# Patient Record
Sex: Female | Born: 1985
Health system: Southern US, Community
[De-identification: ages and names within clinical notes are randomized; demographics above are authoritative.]

## PROBLEM LIST (undated history)

## (undated) ENCOUNTER — Ambulatory Visit (HOSPITAL_COMMUNITY): Admission: EM | Discharge status: Absent without leave | Payer: BLUE CROSS/BLUE SHIELD

## (undated) DIAGNOSIS — T7421XA Adult sexual abuse, confirmed, initial encounter: Secondary | ICD-10-CM

## (undated) DIAGNOSIS — F32A Depression, unspecified: Secondary | ICD-10-CM

## (undated) DIAGNOSIS — F329 Major depressive disorder, single episode, unspecified: Secondary | ICD-10-CM

## (undated) DIAGNOSIS — K219 Gastro-esophageal reflux disease without esophagitis: Secondary | ICD-10-CM

## (undated) DIAGNOSIS — F4310 Post-traumatic stress disorder, unspecified: Secondary | ICD-10-CM

## (undated) DIAGNOSIS — F419 Anxiety disorder, unspecified: Secondary | ICD-10-CM

## (undated) DIAGNOSIS — Z227 Latent tuberculosis: Secondary | ICD-10-CM

## (undated) HISTORY — PX: NO PAST SURGERIES: SHX2092

## (undated) HISTORY — DX: Adult sexual abuse, confirmed, initial encounter: T74.21XA

## (undated) HISTORY — DX: Post-traumatic stress disorder, unspecified: F43.10

## (undated) HISTORY — DX: Latent tuberculosis: Z22.7

---

## 2015-05-28 ENCOUNTER — Ambulatory Visit
Admission: RE | Admit: 2015-05-28 | Discharge: 2015-05-28 | Disposition: A | Discharge status: Home / Self Care | Payer: No Typology Code available for payment source | Source: Ambulatory Visit | Attending: Infectious Disease | Admitting: Infectious Disease

## 2015-05-28 ENCOUNTER — Other Ambulatory Visit: Payer: Self-pay | Admitting: Infectious Disease

## 2015-05-28 DIAGNOSIS — R7611 Nonspecific reaction to tuberculin skin test without active tuberculosis: Secondary | ICD-10-CM

## 2015-06-18 ENCOUNTER — Emergency Department (HOSPITAL_COMMUNITY)
Admission: EM | Admit: 2015-06-18 | Discharge: 2015-06-19 | Disposition: A | Discharge status: Other Acute Inpatient Hospital | Payer: BLUE CROSS/BLUE SHIELD | Attending: Emergency Medicine | Admitting: Emergency Medicine

## 2015-06-18 ENCOUNTER — Encounter (HOSPITAL_COMMUNITY): Payer: Self-pay

## 2015-06-18 DIAGNOSIS — T4274XA Poisoning by unspecified antiepileptic and sedative-hypnotic drugs, undetermined, initial encounter: Secondary | ICD-10-CM | POA: Insufficient documentation

## 2015-06-18 DIAGNOSIS — Y998 Other external cause status: Secondary | ICD-10-CM | POA: Insufficient documentation

## 2015-06-18 DIAGNOSIS — T50904A Poisoning by unspecified drugs, medicaments and biological substances, undetermined, initial encounter: Secondary | ICD-10-CM

## 2015-06-18 DIAGNOSIS — Y9289 Other specified places as the place of occurrence of the external cause: Secondary | ICD-10-CM | POA: Diagnosis not present

## 2015-06-18 DIAGNOSIS — Y9389 Activity, other specified: Secondary | ICD-10-CM | POA: Diagnosis not present

## 2015-06-18 DIAGNOSIS — F329 Major depressive disorder, single episode, unspecified: Secondary | ICD-10-CM | POA: Insufficient documentation

## 2015-06-18 DIAGNOSIS — Z79899 Other long term (current) drug therapy: Secondary | ICD-10-CM | POA: Diagnosis not present

## 2015-06-18 HISTORY — DX: Major depressive disorder, single episode, unspecified: F32.9

## 2015-06-18 HISTORY — DX: Depression, unspecified: F32.A

## 2015-06-18 LAB — ACETAMINOPHEN LEVEL
Acetaminophen (Tylenol), Serum: <10 ug/mL — ABNORMAL LOW (ref 10–30)
Acetaminophen (Tylenol), Serum: <10 ug/mL — ABNORMAL LOW (ref 10–30)

## 2015-06-18 LAB — URINALYSIS, ROUTINE W REFLEX MICROSCOPIC
Bilirubin Urine: NEGATIVE
Glucose, UA: NEGATIVE mg/dL
HGB URINE DIPSTICK: NEGATIVE
Ketones, ur: NEGATIVE mg/dL
Nitrite: NEGATIVE
Protein, ur: NEGATIVE mg/dL
SPECIFIC GRAVITY, URINE: 1.004 — AB (ref 1.005–1.030)
UROBILINOGEN UA: 0.2 mg/dL (ref 0.0–1.0)
pH: 7 (ref 5.0–8.0)

## 2015-06-18 LAB — CBC WITH DIFFERENTIAL/PLATELET
BASOS ABS: 0 10*3/uL (ref 0.0–0.1)
Basophils Relative: 0 %
Eosinophils Absolute: 0.1 10*3/uL (ref 0.0–0.7)
Eosinophils Relative: 1 %
HEMATOCRIT: 40.6 % (ref 36.0–46.0)
HEMOGLOBIN: 13.6 g/dL (ref 12.0–15.0)
LYMPHS PCT: 44 %
Lymphs Abs: 2.4 10*3/uL (ref 0.7–4.0)
MCH: 26.5 pg (ref 26.0–34.0)
MCHC: 33.5 g/dL (ref 30.0–36.0)
MCV: 79.1 fL (ref 78.0–100.0)
Monocytes Absolute: 0.4 10*3/uL (ref 0.1–1.0)
Monocytes Relative: 7 %
NEUTROS ABS: 2.7 10*3/uL (ref 1.7–7.7)
Neutrophils Relative %: 48 %
Platelets: 212 10*3/uL (ref 150–400)
RBC: 5.13 MIL/uL — AB (ref 3.87–5.11)
RDW: 13 % (ref 11.5–15.5)
WBC: 5.5 10*3/uL (ref 4.0–10.5)

## 2015-06-18 LAB — SALICYLATE LEVEL: Salicylate Lvl: <4 mg/dL (ref 2.8–30.0)

## 2015-06-18 LAB — RAPID URINE DRUG SCREEN, HOSP PERFORMED
AMPHETAMINES: NOT DETECTED
BENZODIAZEPINES: NOT DETECTED
Barbiturates: NOT DETECTED
COCAINE: NOT DETECTED
OPIATES: NOT DETECTED
TETRAHYDROCANNABINOL: NOT DETECTED

## 2015-06-18 LAB — COMPREHENSIVE METABOLIC PANEL
ALT: 14 U/L (ref 14–54)
AST: 23 U/L (ref 15–41)
Albumin: 4.4 g/dL (ref 3.5–5.0)
Alkaline Phosphatase: 55 U/L (ref 38–126)
Anion gap: 9 (ref 5–15)
BILIRUBIN TOTAL: 0.8 mg/dL (ref 0.3–1.2)
BUN: 9 mg/dL (ref 6–20)
CHLORIDE: 107 mmol/L (ref 101–111)
CO2: 25 mmol/L (ref 22–32)
CREATININE: 0.94 mg/dL (ref 0.44–1.00)
Calcium: 9.4 mg/dL (ref 8.9–10.3)
GFR calc Af Amer: >60 mL/min (ref >60)
Glucose, Bld: 70 mg/dL (ref 65–99)
Potassium: 3.7 mmol/L (ref 3.5–5.1)
Sodium: 141 mmol/L (ref 135–145)
Total Protein: 8 g/dL (ref 6.5–8.1)

## 2015-06-18 LAB — URINE MICROSCOPIC-ADD ON

## 2015-06-18 LAB — TROPONIN I

## 2015-06-18 MED ORDER — NICOTINE 21 MG/24HR TD PT24
21.0000 mg | MEDICATED_PATCH | Freq: Every day | TRANSDERMAL | Status: DC
  Filled 2015-06-18: qty 1

## 2015-06-18 MED ORDER — ONDANSETRON HCL 4 MG/2ML IJ SOLN
4.0000 mg | Freq: Once | INTRAMUSCULAR | Status: AC
  Administered 2015-06-18: 4 mg via INTRAVENOUS
  Filled 2015-06-18: qty 2

## 2015-06-18 MED ORDER — SODIUM CHLORIDE 0.9 % IV BOLUS (SEPSIS)
1000.0000 mL | Freq: Once | INTRAVENOUS | Status: AC
  Administered 2015-06-18: 1000 mL via INTRAVENOUS

## 2015-06-18 MED ORDER — CHARCOAL ACTIVATED PO LIQD
50.0000 g | Freq: Once | ORAL | Status: AC
Start: 2015-06-18 — End: 2015-06-18
  Administered 2015-06-18: 50 g via ORAL
  Filled 2015-06-18: qty 240

## 2015-06-18 MED ORDER — LORAZEPAM 1 MG PO TABS: 1.0000 mg | ORAL_TABLET | Freq: Three times a day (TID) | ORAL | Status: DC | PRN

## 2015-06-18 MED ORDER — ONDANSETRON HCL 4 MG PO TABS: 4.0000 mg | ORAL_TABLET | Freq: Three times a day (TID) | ORAL | Status: DC | PRN

## 2015-06-18 NOTE — BH Assessment (Addendum)
Tele Assessment Note   Adriana Dawson is an 29 y.o. female.  -Clinician reviewed note by Dr. Rhunette Croft.  Patient took twenty OTC sleep aids in an effort to kill herself.  Patient had gotten into an argument with husband last night.  Today around 11am she took the OTC meds she had purchased at a [employer].  She was on the phone with husband while she took the sleeping pills.  Husband called 911 for patient.  Patient is visibly depressed, flat affect, listless.  She is also obviously drowsy from the sleeping aid she took to try to kill herself.  She said that the overdose was intentional.  Patient had argued with husband about her finding a job.  She cites job Architect and school as two stressors.  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  Paitent denies any HI or A/V hallucinations.  Pt has no previous inpatient or outpatient psychiatric care history.  Patient has been feeling suicidal for the last two months.  She had felt this way during her divorce a few years ago.  Has been with current husband for 3 years.  She admits to isolating herself in her home.  Mother is staying in the home also until November   -Patient care discussed with Donell Sievert, PA who recommended inpatient care.  Patient has been accepted to [facility] 400-2 to the services of Dr. Jama Flavors.  Clinician informed Dr. Elwin Mocha at [facility].  Pt to be transported when the adult unit calls TCU.  Axis I: Major Depression, single episode Axis II: Deferred Axis III:  Past Medical History  Diagnosis Date  . Depression    Axis IV: economic problems, occupational problems and other psychosocial or environmental problems Axis V: 31-40 impairment in reality testing  Past Medical History:  Past Medical History  Diagnosis Date  . Depression     History reviewed. No pertinent past surgical history.  Family History: No family history on file.  Social History:  reports that she has never smoked. She does not have any smokeless tobacco history on  file. Her alcohol and drug histories are not on file.  Additional Social History:  Alcohol / Drug Use Pain Medications: None Prescriptions: None Over the Counter: Biotin vitamins History of alcohol / drug use?: Yes Substance #1 Name of Substance 1: ETOH 1 - Age of First Use: 29 years of age 64 - Amount (size/oz): One glass or wine or beer 1 - Frequency: 1-2 times in a week 1 - Duration: on-going  1 - Last Use / Amount: 09/21  Drank wine when she took the sleeping pills today.  CIWA: CIWA-Ar BP: (!) 99/54 mmHg Pulse Rate: 79 COWS:    PATIENT STRENGTHS: (choose at least two) Average or above average intelligence Capable of independent living Communication skills General fund of knowledge Supportive family/friends  Allergies:  Allergies  Allergen Reactions  . Other Other (See Comments)    6 years ago patient was given a medication for the flu(possibly tamiflu) and it made her face fell funny.    Home Medications:  (Not in a hospital admission)  OB/GYN Status:  Patient's last menstrual period was 06/06/2015.  General Assessment Data Location of Assessment: [facility] TTS Assessment: In system Is this a Tele or Face-to-Face Assessment?: Face-to-Face Is this an Initial Assessment or a Re-assessment for this encounter?: Initial Assessment Marital status: Married Is patient pregnant?: No Pregnancy Status: No Living Arrangements: Spouse/significant other Can pt return to current living arrangement?: Yes Admission Status:  Voluntary Is patient capable of signing voluntary admission?: Yes Referral Source: Self/Family/Friend Insurance type: BC/BS     Crisis Care Plan Living Arrangements: Spouse/significant other Name of Psychiatrist: None Name of Therapist: None  Education Status Is patient currently in school?: Yes Current Grade: At [employer] working on associates Highest grade of school patient has completed: Currently working on an associates Name of school: Veterinary surgeon  person: patient  Risk to self with the past 6 months Suicidal Ideation: Yes-Currently Present Has patient been a risk to self within the past 6 months prior to admission? : No Suicidal Intent: Yes-Currently Present Has patient had any suicidal intent within the past 6 months prior to admission? : Yes Is patient at risk for suicide?: Yes Suicidal Plan?: Yes-Currently Present Has patient had any suicidal plan within the past 6 months prior to admission? : Yes Specify Current Suicidal Plan: Attempted overdose Access to Means: Yes Specify Access to Suicidal Means: OTC sedatives What has been your use of drugs/alcohol within the last 12 months?: ETOH use 1-2 times in a week Previous Attempts/Gestures: No How many times?: 0 Other Self Harm Risks: None Triggers for Past Attempts: None known Intentional Self Injurious Behavior: None Family Suicide History: No Recent stressful life event(s): Conflict (Comment), Financial Problems, Job Loss (Being in school.  Trying to get a job.) Persecutory voices/beliefs?: No Depression: Yes Depression Symptoms: Despondent, Insomnia, Guilt, Loss of interest in usual pleasures, Feeling worthless/self pity, Tearfulness Substance abuse history and/or treatment for substance abuse?: No Suicide prevention information given to non-admitted patients: Not applicable  Risk to Others within the past 6 months Homicidal Ideation: No Does patient have any lifetime risk of violence toward others beyond the six months prior to admission? : No Thoughts of Harm to Others: No Current Homicidal Intent: No Current Homicidal Plan: No Access to Homicidal Means: No Identified Victim: No one' History of harm to others?: No Assessment of Violence: None Noted Violent Behavior Description: None Does patient have access to weapons?: No Criminal Charges Pending?: No Does patient have a court date: No Is patient on probation?: No  Psychosis Hallucinations: None  noted Delusions: None noted  Mental Status Report Appearance/Hygiene: Unremarkable, In scrubs Eye Contact: Good Motor Activity: Freedom of movement, Unremarkable Speech: Logical/coherent, Soft Level of Consciousness: Drowsy Mood: Depressed, Despair, Helpless, Sad, Anxious Affect: Blunted, Depressed, Sad Anxiety Level: Moderate Thought Processes: Coherent, Relevant Judgement: Unimpaired Orientation: Person, Place, Situation Obsessive Compulsive Thoughts/Behaviors: Minimal  Cognitive Functioning Concentration: Normal Memory: Recent Intact, Remote Intact IQ: Average Insight: Good Impulse Control: Poor Appetite: Fair Weight Loss: 0 Weight Gain: 0 Sleep: Decreased Total Hours of Sleep: 6 Vegetative Symptoms: None  ADLScreening [facility] Assessment Services) Patient's cognitive ability adequate to safely complete daily activities?: Yes Patient able to express need for assistance with ADLs?: Yes Independently performs ADLs?: Yes (appropriate for developmental age)  Prior Inpatient Therapy Prior Inpatient Therapy: No Prior Therapy Dates: None Prior Therapy Facilty/Provider(s): None Reason for Treatment: None  Prior Outpatient Therapy Prior Outpatient Therapy: No Prior Therapy Dates: N/a Prior Therapy Facilty/Provider(s): N/A Reason for Treatment: N/A Does patient have an ACCT team?: No Does patient have Intensive In-House Services?  : No Does patient have [facility] services? : No Does patient have P4CC services?: No  ADL Screening (condition at time of admission) Patient's cognitive ability adequate to safely complete daily activities?: Yes Is the patient deaf or have difficulty hearing?: No Does the patient have difficulty seeing, even when wearing glasses/contacts?: No Does the patient have difficulty concentrating,  remembering, or making decisions?: No Patient able to express need for assistance with ADLs?: Yes Does the patient have difficulty dressing or bathing?:  No Independently performs ADLs?: Yes (appropriate for developmental age) Does the patient have difficulty walking or climbing stairs?: No Weakness of Legs: None Weakness of Arms/Hands: None       Abuse/Neglect Assessment (Assessment to be complete while patient is alone) Physical Abuse: Denies Verbal Abuse: Denies Sexual Abuse: Denies Exploitation of patient/patient's resources: Denies Self-Neglect: Denies     Merchant navy officer (For Healthcare) Does patient have an advance directive?: No Would patient like information on creating an advanced directive?: No - patient declined information    Additional Information 1:1 In Past 12 Months?: No CIRT Risk: No Elopement Risk: No Does patient have medical clearance?: Yes     Disposition:  Disposition Initial Assessment Completed for this Encounter: Yes Disposition of Patient: Inpatient treatment program, Referred to Type of inpatient treatment program: Adult Patient referred to:  (To be reviewed with PA.)  Beatriz Stallion Ray 06/18/2015 8:22 PM

## 2015-06-18 NOTE — ED Notes (Signed)
Bed: WA04 Expected date:  Expected time:  Means of arrival:  Comments: EMS-OD 

## 2015-06-18 NOTE — ED Notes (Signed)
MD at bedside. 

## 2015-06-18 NOTE — Progress Notes (Signed)
Patient listed as not having insurance or a pcp.  EDCM spoke to patient at bedside, sitter present.  Patient reports she has [employer].  EDCM confirmed patient has [employer].  EDCM provided patient with list of pcps who accept BCBS insurance within a 10 mile radius of patient's zip code.  Patient thankful for resources.  No further EDCM needs at this time.

## 2015-06-18 NOTE — ED Notes (Signed)
Family at bedside. 

## 2015-06-18 NOTE — ED Notes (Signed)
Report given to Whitney in [place].

## 2015-06-18 NOTE — ED Notes (Signed)
Pt transported by [facility] for attempted overdose.  Pt took approximately 20 Unisom sleeptabs (Doxylamine succinate ) at approx 1050 today.  Pt stated to husband today that she was going to jump off bridge.  Patient has hx of anxiety & depression and states she has been thinking of hurting herself x 2months.  Poison control contacted - see recommendations below.

## 2015-06-18 NOTE — ED Notes (Signed)
Pt husband Adriana Dawson is leaving and would like to leave phone number if needed to be contacted. [phone]

## 2015-06-18 NOTE — ED Notes (Addendum)
Jacki Cones, Research scientist (medical) from from poison control noted that patient was cleared from their protocol at 1814.   Pt remains asymptomatic, denies nausea. No vomiting noted.

## 2015-06-18 NOTE — ED Notes (Signed)
[facility] dispatch called for patient transport to behavior health.

## 2015-06-18 NOTE — ED Notes (Addendum)
TTS consultant at bedside, husband asked to leave.

## 2015-06-18 NOTE — ED Notes (Signed)
Pt coming in after an intentional Unisom OD.  Per Poison Control, Pt took Unisom  x 20 tablets around 1050.    Poison Control recommends: activated charcoal, if the Pt is alert and arrives prior to 1250 -EKG, cardiac monitoring -4hr Post Ingestion Acetamenophen level -Observe x 6hrs from ingestion -Supportive care: CNS depression, Tachycardia, HTN, Decreased bowel sounds, and Urinary Retention -Possible seizure activity: given Benzos or Phenobarb

## 2015-06-18 NOTE — ED Provider Notes (Signed)
CSN: [id number]     Arrival date & time 06/18/15  1215 History   First MD Initiated Contact with Patient 06/18/15 1250     Chief Complaint  Patient presents with  . Drug Overdose     (Consider location/radiation/quality/duration/timing/severity/associated sxs/prior Treatment) HPI Comments: Pt brought in via EMS with cc of OD. She reports being overwhelmed currently - she is new to the area, and is going to school, looking for jobs and is just stressed. Today, she had an argument with her husband and told him she was going to kill herself. She went to [facility], picked up OTC sleep meds and admits to taking 20 tabs prior to ER arrival. Pt took meds between 10:30-11:00.  She thinks she is depressed. She has never seen a psychiatrist. She also feels that she has not quite moved on from her divorce from 5 years ago. She had SI when she was getting divorced, but she has never made any attempts until now.   Patient is a 29 y.o. female presenting with Overdose. The history is provided by the patient.  Drug Overdose Pertinent negatives include no chest pain, no abdominal pain, no headaches and no shortness of breath.    Past Medical History  Diagnosis Date  . Depression    History reviewed. No pertinent past surgical history. No family history on file. Social History  Substance Use Topics  . Smoking status: Never Smoker   . Smokeless tobacco: None  . Alcohol Use: Yes     Comment: few drinks per week   OB History    No data available     Review of Systems  Constitutional: Positive for activity change.  Respiratory: Positive for chest tightness. Negative for shortness of breath.   Cardiovascular: Negative for chest pain.  Gastrointestinal: Negative for nausea, vomiting and abdominal pain.  Genitourinary: Negative for dysuria.  Musculoskeletal: Negative for neck pain.  Neurological: Negative for headaches.  Psychiatric/Behavioral: Positive for suicidal ideas, sleep disturbance and  self-injury.  All other systems reviewed and are negative.     Allergies  Other  Home Medications   Prior to Admission medications   Medication Sig Start Date End Date Taking? Authorizing Provider  BIOTIN 5000 PO Take 1 tablet by mouth daily.   Yes Historical Provider, MD   BP 102/68 mmHg  Pulse 62  Temp(Src) 98.2 F (36.8 C) (Oral)  Resp 16  SpO2 99%  LMP 06/06/2015 Physical Exam  Constitutional: She is oriented to person, place, and time. She appears well-developed and well-nourished.  HENT:  Head: Normocephalic and atraumatic.  Eyes: EOM are normal. Pupils are equal, round, and reactive to light.  Neck: Neck supple.  Cardiovascular: Normal rate, regular rhythm and normal heart sounds.   No murmur heard. Pulmonary/Chest: Effort normal. No respiratory distress.  Abdominal: Soft. She exhibits no distension. There is no tenderness. There is no rebound and no guarding.  Neurological: She is alert and oriented to person, place, and time.  Skin: Skin is warm and dry.  Nursing note and vitals reviewed.   ED Course  Procedures (including critical care time) Labs Review Labs Reviewed  CBC WITH DIFFERENTIAL/PLATELET - Abnormal; Notable for the following:    RBC 5.13 (*)    All other components within normal limits  ACETAMINOPHEN LEVEL - Abnormal; Notable for the following:    Acetaminophen (Tylenol), Serum <10 (*)    All other components within normal limits  URINALYSIS, ROUTINE W REFLEX MICROSCOPIC (NOT AT [facility] - Abnormal; Notable for  the following:    Specific Gravity, Urine 1.004 (*)    Leukocytes, UA SMALL (*)    All other components within normal limits  URINE MICROSCOPIC-ADD ON - Abnormal; Notable for the following:    Squamous Epithelial / LPF FEW (*)    All other components within normal limits  ACETAMINOPHEN LEVEL - Abnormal; Notable for the following:    Acetaminophen (Tylenol), Serum <10 (*)    All other components within normal limits  COMPREHENSIVE  METABOLIC PANEL  TROPONIN I  SALICYLATE LEVEL  URINE RAPID DRUG SCREEN, HOSP PERFORMED  POC URINE PREG, ED    Imaging Review No results found. I have personally reviewed and evaluated these images and lab results as part of my medical decision-making.   EKG Interpretation   Date/Time:  Wednesday June 18 2015 12:36:43 EDT Ventricular Rate:  74 PR Interval:  161 QRS Duration: 83 QT Interval:  402 QTC Calculation: 446 R Axis:   82 Text Interpretation:  Sinus rhythm Baseline wander in lead(s) V2 ED  PHYSICIAN INTERPRETATION AVAILABLE IN [facility] Confirmed by TEST,  Record ([id number]) on 06/19/2015 7:31:41 AM      MDM   Final diagnoses:  Overdose, undetermined intent, initial encounter    Pt overdosed on sleep meds. She was given activated charcoal as per recommendation from the poison control. She was monitored closely for 4+ hours and then medically cleared for psych evaluation.  Pt stayed hemodynamically stable.  CRITICAL CARE Performed by: Derwood Kaplan   Total critical care time: 38 min  Critical care time was exclusive of separately billable procedures and treating other patients.  Critical care was necessary to treat or prevent imminent or life-threatening deterioration.  Critical care was time spent personally by me on the following activities: development of treatment plan with patient and/or surrogate as well as nursing, discussions with consultants, evaluation of patient's response to treatment, examination of patient, obtaining history from patient or surrogate, ordering and performing treatments and interventions, ordering and review of laboratory studies, ordering and review of radiographic studies, pulse oximetry and re-evaluation of patient's condition.     Derwood Kaplan, MD 06/20/15 1941

## 2015-06-18 NOTE — ED Notes (Signed)
Patient is resting comfortably. Sitter at bedside. Pt remains in no distress.

## 2015-06-19 ENCOUNTER — Inpatient Hospital Stay (HOSPITAL_COMMUNITY)
Admission: AD | Admit: 2015-06-19 | Discharge: 2015-06-21 | DRG: 885 | Disposition: A | Discharge status: Home / Self Care | Payer: BLUE CROSS/BLUE SHIELD | Source: Intra-hospital | Attending: Psychiatry | Admitting: Psychiatry

## 2015-06-19 ENCOUNTER — Encounter (HOSPITAL_COMMUNITY): Payer: Self-pay

## 2015-06-19 DIAGNOSIS — F322 Major depressive disorder, single episode, severe without psychotic features: Secondary | ICD-10-CM

## 2015-06-19 DIAGNOSIS — T450X2A Poisoning by antiallergic and antiemetic drugs, intentional self-harm, initial encounter: Secondary | ICD-10-CM | POA: Diagnosis not present

## 2015-06-19 DIAGNOSIS — R45851 Suicidal ideations: Secondary | ICD-10-CM | POA: Diagnosis present

## 2015-06-19 HISTORY — DX: Major depressive disorder, single episode, severe without psychotic features: F32.2

## 2015-06-19 LAB — POC URINE PREG, ED: Preg Test, Ur: NEGATIVE

## 2015-06-19 MED ORDER — FLUOXETINE HCL 20 MG PO CAPS
20.0000 mg | ORAL_CAPSULE | Freq: Every day | ORAL | Status: DC
  Administered 2015-06-19 – 2015-06-21 (×3): 20 mg via ORAL
  Filled 2015-06-19 (×6): qty 1

## 2015-06-19 MED ORDER — HYDROXYZINE HCL 25 MG PO TABS: 25.0000 mg | ORAL_TABLET | Freq: Four times a day (QID) | ORAL | Status: DC | PRN

## 2015-06-19 MED ORDER — ALUM & MAG HYDROXIDE-SIMETH 200-200-20 MG/5ML PO SUSP: 30.0000 mL | ORAL | Status: DC | PRN

## 2015-06-19 MED ORDER — TRAZODONE HCL 50 MG PO TABS
50.0000 mg | ORAL_TABLET | Freq: Every evening | ORAL | Status: DC | PRN
  Filled 2015-06-19 (×8): qty 1

## 2015-06-19 MED ORDER — MAGNESIUM HYDROXIDE 400 MG/5ML PO SUSP: 30.0000 mL | Freq: Every day | ORAL | Status: DC | PRN

## 2015-06-19 MED ORDER — ACETAMINOPHEN 325 MG PO TABS: 650.0000 mg | ORAL_TABLET | Freq: Four times a day (QID) | ORAL | Status: DC | PRN

## 2015-06-19 NOTE — BHH Suicide Risk Assessment (Signed)
[facility] Admission Suicide Risk Assessment   Nursing information obtained from:   patient and chart  Demographic factors:   married, enrolled in Nursing School  Current Mental Status:   see below  Loss Factors:   marital stressors , stress with parents at home and nursing school, financial constraints  Historical Factors:   no prior psychiatric history Risk Reduction Factors:   resilience, social support  Total Time spent with patient: 45 minutes Principal Problem:  MDD, single episode  Diagnosis:   Patient Active Problem List   Diagnosis Date Noted  . MDD (major depressive disorder), single episode, severe , no psychosis [F32.2] 06/19/2015     Continued Clinical Symptoms:  Alcohol Use Disorder Identification Test Final Score (AUDIT): 3 The "Alcohol Use Disorders Identification Test", Guidelines for Use in Primary Care, Second Edition.  World Science writer [facility]). Score between 0-7:  no or low risk or alcohol related problems. Score between 8-15:  moderate risk of alcohol related problems. Score between 16-19:  high risk of alcohol related problems. Score 20 or above:  warrants further diagnostic evaluation for alcohol dependence and treatment.   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 rate 16, height  (1.6 m), weight 131 lb (59.421 kg), last menstrual period 06/06/2015.Body mass index is 23.21 kg/(m^2).   See Admit Note MSE                                                        COGNITIVE FEATURES THAT CONTRIBUTE TO RISK:  Closed-mindedness and Loss of executive function    SUICIDE RISK:   Moderate:  Frequent suicidal ideation with limited intensity, and duration, some  specificity in terms of plans, no associated intent, good self-control, limited dysphoria/symptomatology, some risk factors present, and identifiable protective factors, including available and accessible social support.  PLAN OF CARE: Patient will be admitted to inpatient psychiatric unit for stabilization and safety. Will provide and encourage milieu participation. Provide medication management and maked adjustments as needed.  Will follow daily.    Medical Decision Making:  Review of Psycho-Social Stressors (1), Review or order clinical lab tests (1), Established Problem, Worsening (2) and Review of New Medication or Change in Dosage (2)  I certify that inpatient services furnished can reasonably be expected to improve the patient's condition.   Adriana Dawson 06/19/2015, 3:01 PM

## 2015-06-19 NOTE — Progress Notes (Signed)
Adult Psychoeducational Group Note  Date:  06/19/2015 Time:  0900  Group Topic/Focus:  Orientation:   The focus of this group is to educate the patient on the purpose and policies of crisis stabilization and provide a format to answer questions about their admission.  The group details unit policies and expectations of patients while admitted.  Participation Level:  Active  Participation Quality:  Appropriate  Affect:  Appropriate  Cognitive:  Appropriate  Insight: Appropriate  Engagement in Group:  Engaged  Modes of Intervention:  Orientation  Additional Comments:  Enjoys zip lining!   Kortnie Stovall L 06/19/2015, 11:15 AM

## 2015-06-19 NOTE — Progress Notes (Signed)
D: Pt presents with flat affect and depressed mood. Pt rates depression 9/10. Pt denies suicidal thoughts. Pt reported that she have had periods were she is happy and then sad or upset. Pt reported that her sadness is not triggered by any stressors. Pt reported that she's been having this problems for about four years. A: Medications reviewed. Verbal support given. Pt encouraged to attend groups. 15 minute checks performed for safety. R: Pt receptive to tx.

## 2015-06-19 NOTE — Tx Team (Signed)
Interdisciplinary Treatment Plan Update (Adult) Date: 06/19/2015   Date: 06/19/2015 1:34 PM  Progress in Treatment:  Attending groups: Yes  Participating in groups: Yes  Taking medication as prescribed: Yes  Tolerating medication: Yes  Family/Significant othe contact made: No, CSW attempting to make contact with husband Patient understands diagnosis: Yes AEB seeking help with depression and anxiety Discussing patient identified problems/goals with staff: Yes  Medical problems stabilized or resolved: Yes  Denies suicidal/homicidal ideation: Yes Patient has not harmed self or Others: Yes   New problem(s) identified: None identified at this time.   Discharge Plan or Barriers: Pt will return home and follow-up with outpatient resources.  Additional comments: n/a   Reason for Continuation of Hospitalization:  Anxiety Depression Medication stabilization Suicidal ideation  Estimated length of stay: 3-5 days  Review of initial/current patient goals per problem list:   1.  Goal(s): Patient will participate in aftercare plan  Met:  Yes  Target date: 3-5 days from date of admission   As evidenced by: Patient will participate within aftercare plan AEB aftercare provider and housing plan at discharge being identified.   06/19/15: Pt will return home and follow-up with outpatient resources  2.  Goal (s): Patient will exhibit decreased depressive symptoms and suicidal ideations.  Met:  No  Target date: 3-5 days from date of admission   As evidenced by: Patient will utilize self rating of depression at 3 or below and demonstrate decreased signs of depression or be deemed stable for discharge by MD. 06/19/15: Pt was admitted with symptoms of depression, rating 10/10. Pt continues to present with flat affect and depressive symptoms.  Pt will demonstrate decreased symptoms of depression and rate depression at 3/10 or lower prior to discharge.  3.  Goal(s): Patient will demonstrate  decreased signs and symptoms of anxiety.  Met:  No  Target date: 3-5 days from date of admission   As evidenced by: Patient will utilize self rating of anxiety at 3 or below and demonstrated decreased signs of anxiety, or be deemed stable for discharge by MD 06/19/15: Pt was admitted with increased levels of anxiety and is currently rating those symptoms highly. Pt will demonstrated decreased symptoms of anxiety and rate it at 3/10 prior to d/c.  Attendees:  Patient:    Family:    Physician: Dr. Parke Poisson, MD  06/19/2015 1:34 PM  Nursing: Lars Pinks, RN Case manager  06/19/2015 1:34 PM  Clinical Social Worker Norman Clay, MSW 06/19/2015 1:34 PM  Other: Lucinda Dell, Beverly Sessions Liasion 06/19/2015 1:34 PM  Clinical: Darrol Angel, RN 06/19/2015 1:34 PM  Other: , RN Charge Nurse 06/19/2015 1:34 PM  Other:     Peri Maris, [place] MSW
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
## 2015-06-19 NOTE — Progress Notes (Signed)
D: Patient in the hallway today.  Patient states she had a good day.  Patient states she had a good day.  Patient states she has been talking to her peers and that has made her have a good day.  Patient states her husband visited today and she states they were able to talk.  Patient signed a 72 hour request for discharge tonight. Patient denies SI/HI and denies AVH.   A: Staff to monitor Q 15 mins for safety.  Encouragement and support offered.  No scheduled medications administered per orders.   R: Patient remains safe on the unit.  Patient attended group tonight.  Patient visible on the unit tonight.  Patient taking administered medications.

## 2015-06-19 NOTE — Tx Team (Signed)
Initial Interdisciplinary Treatment Plan   PATIENT STRESSORS: Educational concerns Financial difficulties Marital or family conflict Occupational concerns   PATIENT STRENGTHS: Average or above average intelligence Communication skills General fund of knowledge Motivation for treatment/growth Supportive family/friends   PROBLEM LIST: Problem List/Patient Goals Date to be addressed Date deferred Reason deferred Estimated date of resolution  Risk for suicide 06/19/15     Family issues 06/19/15     "can't think of anything right now" 06/19/15     Anxiety 06/19/15                                    DISCHARGE CRITERIA:  Ability to meet basic life and health needs Improved stabilization in mood, thinking, and/or behavior Verbal commitment to aftercare and medication compliance  PRELIMINARY DISCHARGE PLAN: Attend aftercare/continuing care group Outpatient therapy Return to previous living arrangement  PATIENT/FAMIILY INVOLVEMENT: This treatment plan has been presented to and reviewed with the patient, Nyilah Kight.  The patient and family have been given the opportunity to ask questions and make suggestions.  Jacques Navy A 06/19/2015, 2:27 AM

## 2015-06-19 NOTE — [facility] Group Notes (Signed)
[facility] Group Therapy 06/19/2015 1:15pm  Type of Therapy: [facility] Presentation  Participation Level: Active  Participation Quality: Attentive  Affect: Appropriate  Cognitive: Oriented  Insight: Developing/Improving  Engagement in Therapy: Engaged  Modes of Intervention: Discussion, Education and Socialization  Summary of Progress/Problems: Mental [facility] Association (MHA) Speaker came to talk about his personal journey with substance abuse and addiction. The pt processed ways by which to relate to the speaker. MHA speaker provided handouts and educational information pertaining to groups and services offered by the [facility] Pt was engaged in speaker's presentation and was receptive to resources provided.    Chad Cordial, LCSWA 06/19/2015 1:39 PM

## 2015-06-19 NOTE — Progress Notes (Signed)
Patient ID: Adriana Dawson, female   DOB: 1986/07/07, 29 y.o.   MRN: [id number]  Admission Note:  29 yr female who presents VC in no acute distress for the treatment of SI,  Depression and anxiety. Pt appears flat and depressed. Pt was calm and cooperative with admission process. Pt presents with passive SI and contracts for safety upon admission. Pt denies AVH . Pt OD'd on 20 unisom pills due to stress and feeling overwhelmed and not knowing how to cope. 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 Pt has no Psych hx, this is first experience.     A:Skin was assessed and found to be clear of any abnormal marks . PT searched and no contraband found, POC and unit policies explained and understanding verbalized. Consents obtained. Food and fluids offered, and fluids accepted.  R: Pt had no additional questions or concerns.

## 2015-06-19 NOTE — H&P (Signed)
Psychiatric Admission Assessment Adult  Patient Identification: Adriana Dawson MRN:  [id number] Date of Evaluation:  06/19/2015 Chief Complaint:   " I have been feeling overwhelmed " Principal Diagnosis:  Suicide attempt by overdose   Diagnosis:   Patient Active Problem List   Diagnosis Date Noted  . MDD (major depressive disorder), single episode, severe , no psychosis [F32.2] 06/19/2015   History of Present Illness: 29 year old female, reports she was feeling verwhelmed currently - she is new to the area (moved from [place]), and is going to school(nursing), looking for jobs and is just " very  Stressed" . She also reports having  arguments with her husband about their financial situation. During argument she told him she was going to kill herself. Impulsively she went to a drugstore , picked up OTC sleep meds and admits to taking 20 tabs prior to ER arrival.  She states that after taking them she felt very drowsy and told husband who brought her to hospital. These events occurred yesterday.  She reports depression and neuro vegetative symptoms as below. . She has never seen a psychiatrist. She also feels that she has not quite moved on from her divorce from 5 years ago, and states she feels part of the divorce was her fault. She is sadden that he was able to move on so easily and get married but she has not been able to(was deeply attracted to him).  She had SI when she was getting divorced, but she has never made any attempts until now.   Elements:   Gradually Worsening depression, currently severe, in the context of severe psycho-social  stressors, which have been increasing- status post suicide attempt .  Associated Signs/Symptoms: Depression Symptoms:  depressed mood, fatigue, feelings of worthlessness/guilt, difficulty concentrating, hopelessness, suicidal attempt, (Hypo) Manic Symptoms:  None Anxiety Symptoms:  States she feels she worries excessively  Psychotic Symptoms:  None PTSD  Symptoms: Does not endorse  Total Time spent with patient: 45 minutes    Past Psychiatric History-  No prior psychiatric admissions, no prior suicide attempts, no history of cutting, no history of psychosis, denies any history of mania . Has not been on psychiatric medications in the past .  Past Medical History: denies any medical issues, NKDA, does not smoke .  Past Medical History  Diagnosis Date  . Depression    History reviewed. No pertinent past surgical history. Family History:  Parents alive,  Currently living with patient, Father alcoholic , has one brother . Denies mood disorder, brother has history of substance dependence  Social History:  Married x 2 , divorced , Nursing Student, no children, parents currently living with her , no legal issues  History  Alcohol Use  . Yes    Comment: few drinks per week     History  Drug Use Not on file    Social History   Social History  . Marital Status: Married    Spouse Name: N/A  . Number of Children: N/A  . Years of Education: N/A   Social History Main Topics  . Smoking status: Never Smoker   . Smokeless tobacco: None  . Alcohol Use: Yes     Comment: few drinks per week  . Drug Use: None  . Sexual Activity: Yes    Birth Control/ Protection: Condom   Other Topics Concern  . None   Social History Narrative   Additional Social History:    Musculoskeletal: Strength & Muscle Tone: within normal limits Gait &  Station: normal Patient leans: N/A  Psychiatric Specialty Exam: Physical Exam  Constitutional: She is oriented to person, place, and time. She appears well-developed.  HENT:  Head: Normocephalic.  Eyes: Pupils are equal, round, and reactive to light.  Neck: Normal range of motion.  Cardiovascular: Normal rate.   Respiratory: Effort normal.  GI: Soft.  Musculoskeletal: Normal range of motion.  Neurological: She is alert and oriented to person, place, and time.  Skin: Skin is warm and dry.  Psychiatric:  Her behavior is normal. Judgment and thought content normal.  Depressed mood and worthless    Review of Systems  Constitutional: Negative.   HENT: Negative.  Negative for hearing loss.   Eyes: Negative.   Respiratory: Negative.   Cardiovascular: Negative.   Gastrointestinal: Negative.   Genitourinary: Negative.   Musculoskeletal: Negative.   Skin: Negative.   Neurological: Negative for seizures and headaches.  Endo/Heme/Allergies: Negative.   Psychiatric/Behavioral: Positive for depression and suicidal ideas.  All other systems reviewed and are negative.   Blood pressure 106/77, pulse 75, temperature 98 F (36.7 C), temperature source Oral, resp. rate 16, height 5' 3"  (1.6 m), weight 131 lb (59.421 kg), last menstrual period 06/06/2015.Body mass index is 23.21 kg/(m^2).  General Appearance: Fairly Groomed  Engineer, water::  Good  Speech:  Clear and Coherent  Volume:  Normal  Mood:  Depressed  Affect:  Constricted and Depressed  Thought Process:  Linear  Orientation:  Full (Time, Place, and Person)  Thought Content:   No hallucinations, no delusions   Suicidal Thoughts:  No denies any current suicidal ideations or self injurious ideations , states she regrets having attempted suicide and that she realizes she has a lot to live for.   Homicidal Thoughts:  No  Memory:  Immediate;   Good Recent;   Good Remote;   Good  Judgement:   Fair   Insight:  Fair  Psychomotor Activity:  Normal  Concentration:  Good  Recall:  Good  Fund of Knowledge:Good  Language: Good  Akathisia:  No  Handed:  Right  AIMS (if indicated):     Assets:  Desire for Improvement Physical Health Talents/Skills Vocational/Educational  ADL's:  Intact  Cognition: WNL  Sleep:  4-5 hours   Risk to Self: Is patient at risk for suicide?: Yes What has been your use of drugs/alcohol within the last 12 months?: ETOH use 1-2 times a week Risk to Others:   Prior Inpatient Therapy:   Prior Outpatient Therapy:     Alcohol Screening: 1. How often do you have a drink containing alcohol?: 2 to 3 times a week 2. How many drinks containing alcohol do you have on a typical day when you are drinking?: 1 or 2 3. How often do you have six or more drinks on one occasion?: Never Preliminary Score: 0 4. How often during the last year have you found that you were not able to stop drinking once you had started?: Never 5. How often during the last year have you failed to do what was normally expected from you becasue of drinking?: Never 6. How often during the last year have you needed a first drink in the morning to get yourself going after a heavy drinking session?: Never 7. How often during the last year have you had a feeling of guilt of remorse after drinking?: Never 8. How often during the last year have you been unable to remember what happened the night before because you had been drinking?: Never 9.  Have you or someone else been injured as a result of your drinking?: No 10. Has a relative or friend or a doctor or another health worker been concerned about your drinking or suggested you cut down?: No Alcohol Use Disorder Identification Test Final Score (AUDIT): 3 Brief Intervention: AUDIT score less than 7 or less-screening does not suggest unhealthy drinking-brief intervention not indicated  Allergies:   Allergies  Allergen Reactions  . Other Other (See Comments)    6 years ago patient was given a medication for the flu(possibly tamiflu) and it made her face fell funny.   Lab Results:  Results for orders placed or performed during the hospital encounter of 06/18/15 (from the past 48 hour(s))  CBC with Differential/Platelet     Status: Abnormal   Collection Time: 06/18/15  1:18 PM  Result Value Ref Range   WBC 5.5 4.0 - 10.5 K/uL   RBC 5.13 (H) 3.87 - 5.11 MIL/uL   Hemoglobin 13.6 12.0 - 15.0 g/dL   HCT 40.6 36.0 - 46.0 %   MCV 79.1 78.0 - 100.0 fL   MCH 26.5 26.0 - 34.0 pg   MCHC 33.5 30.0 - 36.0  g/dL   RDW 13.0 11.5 - 15.5 %   Platelets 212 150 - 400 K/uL   Neutrophils Relative % 48 %   Neutro Abs 2.7 1.7 - 7.7 K/uL   Lymphocytes Relative 44 %   Lymphs Abs 2.4 0.7 - 4.0 K/uL   Monocytes Relative 7 %   Monocytes Absolute 0.4 0.1 - 1.0 K/uL   Eosinophils Relative 1 %   Eosinophils Absolute 0.1 0.0 - 0.7 K/uL   Basophils Relative 0 %   Basophils Absolute 0.0 0.0 - 0.1 K/uL  Comprehensive metabolic panel     Status: None   Collection Time: 06/18/15  1:18 PM  Result Value Ref Range   Sodium 141 135 - 145 mmol/L   Potassium 3.7 3.5 - 5.1 mmol/L   Chloride 107 101 - 111 mmol/L   CO2 25 22 - 32 mmol/L   Glucose, Bld 70 65 - 99 mg/dL   BUN 9 6 - 20 mg/dL   Creatinine, Ser 0.94 0.44 - 1.00 mg/dL   Calcium 9.4 8.9 - 10.3 mg/dL   Total Protein 8.0 6.5 - 8.1 g/dL   Albumin 4.4 3.5 - 5.0 g/dL   AST 23 15 - 41 U/L   ALT 14 14 - 54 U/L   Alkaline Phosphatase 55 38 - 126 U/L   Total Bilirubin 0.8 0.3 - 1.2 mg/dL   GFR calc non Af Amer >60 >60 mL/min   GFR calc Af Amer >60 >60 mL/min    Comment: (NOTE) The eGFR has been calculated using the CKD EPI equation. This calculation has not been validated in all clinical situations. eGFR's persistently <60 mL/min signify possible Chronic Kidney Disease.    Anion gap 9 5 - 15  Troponin I     Status: None   Collection Time: 06/18/15  1:18 PM  Result Value Ref Range   Troponin I <0.03 <0.031 ng/mL    Comment:        NO INDICATION OF MYOCARDIAL INJURY.   Salicylate level     Status: None   Collection Time: 06/18/15  1:18 PM  Result Value Ref Range   Salicylate Lvl <1.7 2.8 - 30.0 mg/dL  Acetaminophen level     Status: Abnormal   Collection Time: 06/18/15  1:18 PM  Result Value Ref Range   Acetaminophen (Tylenol),  Serum <10 (L) 10 - 30 ug/mL    Comment:        THERAPEUTIC CONCENTRATIONS VARY SIGNIFICANTLY. A RANGE OF 10-30 ug/mL MAY BE AN EFFECTIVE CONCENTRATION FOR MANY PATIENTS. HOWEVER, SOME ARE BEST TREATED AT  CONCENTRATIONS OUTSIDE THIS RANGE. ACETAMINOPHEN CONCENTRATIONS >150 ug/mL AT 4 HOURS AFTER INGESTION AND >50 ug/mL AT 12 HOURS AFTER INGESTION ARE OFTEN ASSOCIATED WITH TOXIC REACTIONS.   Urinalysis, Routine w reflex microscopic (not at [facility])     Status: Abnormal   Collection Time: 06/18/15  1:18 PM  Result Value Ref Range   Color, Urine YELLOW YELLOW   APPearance CLEAR CLEAR   Specific Gravity, Urine 1.004 (L) 1.005 - 1.030   pH 7.0 5.0 - 8.0   Glucose, UA NEGATIVE NEGATIVE mg/dL   Hgb urine dipstick NEGATIVE NEGATIVE   Bilirubin Urine NEGATIVE NEGATIVE   Ketones, ur NEGATIVE NEGATIVE mg/dL   Protein, ur NEGATIVE NEGATIVE mg/dL   Urobilinogen, UA 0.2 0.0 - 1.0 mg/dL   Nitrite NEGATIVE NEGATIVE   Leukocytes, UA SMALL (A) NEGATIVE  Urine rapid drug screen (hosp performed)     Status: None   Collection Time: 06/18/15  1:18 PM  Result Value Ref Range   Opiates NONE DETECTED NONE DETECTED   Cocaine NONE DETECTED NONE DETECTED   Benzodiazepines NONE DETECTED NONE DETECTED   Amphetamines NONE DETECTED NONE DETECTED   Tetrahydrocannabinol NONE DETECTED NONE DETECTED   Barbiturates NONE DETECTED NONE DETECTED    Comment:        DRUG SCREEN FOR MEDICAL PURPOSES ONLY.  IF CONFIRMATION IS NEEDED FOR ANY PURPOSE, NOTIFY LAB WITHIN 5 DAYS.        LOWEST DETECTABLE LIMITS FOR URINE DRUG SCREEN Drug Class       Cutoff (ng/mL) Amphetamine      1000 Barbiturate      200 Benzodiazepine   161 Tricyclics       096 Opiates          300 Cocaine          300 THC              50   Urine microscopic-add on     Status: Abnormal   Collection Time: 06/18/15  1:18 PM  Result Value Ref Range   Squamous Epithelial / LPF FEW (A) RARE   WBC, UA 0-2 <3 WBC/hpf   Bacteria, UA RARE RARE  POC urine preg, ED (not at [facility])     Status: None   Collection Time: 06/18/15  1:24 PM  Result Value Ref Range   Preg Test, Ur NEGATIVE NEGATIVE    Comment:        THE SENSITIVITY OF THIS METHODOLOGY IS >24  mIU/mL   Acetaminophen level     Status: Abnormal   Collection Time: 06/18/15  5:16 PM  Result Value Ref Range   Acetaminophen (Tylenol), Serum <10 (L) 10 - 30 ug/mL    Comment:        THERAPEUTIC CONCENTRATIONS VARY SIGNIFICANTLY. A RANGE OF 10-30 ug/mL MAY BE AN EFFECTIVE CONCENTRATION FOR MANY PATIENTS. HOWEVER, SOME ARE BEST TREATED AT CONCENTRATIONS OUTSIDE THIS RANGE. ACETAMINOPHEN CONCENTRATIONS >150 ug/mL AT 4 HOURS AFTER INGESTION AND >50 ug/mL AT 12 HOURS AFTER INGESTION ARE OFTEN ASSOCIATED WITH TOXIC REACTIONS.    Current Medications: Current Facility-Administered Medications  Medication Dose Route Frequency Provider Last Rate Last Dose  . acetaminophen (TYLENOL) tablet 650 mg  650 mg Oral Q6H PRN Laverle Hobby, PA-C      .  alum & mag hydroxide-simeth (MAALOX/MYLANTA) 200-200-20 MG/5ML suspension 30 mL  30 mL Oral Q4H PRN Laverle Hobby, PA-C      . hydrOXYzine (ATARAX/VISTARIL) tablet 25 mg  25 mg Oral Q6H PRN Laverle Hobby, PA-C      . magnesium hydroxide (MILK OF MAGNESIA) suspension 30 mL  30 mL Oral Daily PRN Laverle Hobby, PA-C      . traZODone (DESYREL) tablet 50 mg  50 mg Oral QHS,MR X 1 Spencer E Simon, PA-C       PTA Medications: Prescriptions prior to admission  Medication Sig Dispense Refill Last Dose  . BIOTIN 5000 PO Take 1 tablet by mouth daily.   Past Week at Unknown time    Previous Psychotropic Medications: No Substance Abuse History in the last 12 months:  No. denies alcohol abuse or substance abuse     Consequences of Substance Abuse: Negative  Results for orders placed or performed during the hospital encounter of 06/18/15 (from the past 72 hour(s))  CBC with Differential/Platelet     Status: Abnormal   Collection Time: 06/18/15  1:18 PM  Result Value Ref Range   WBC 5.5 4.0 - 10.5 K/uL   RBC 5.13 (H) 3.87 - 5.11 MIL/uL   Hemoglobin 13.6 12.0 - 15.0 g/dL   HCT 40.6 36.0 - 46.0 %   MCV 79.1 78.0 - 100.0 fL   MCH 26.5 26.0 -  34.0 pg   MCHC 33.5 30.0 - 36.0 g/dL   RDW 13.0 11.5 - 15.5 %   Platelets 212 150 - 400 K/uL   Neutrophils Relative % 48 %   Neutro Abs 2.7 1.7 - 7.7 K/uL   Lymphocytes Relative 44 %   Lymphs Abs 2.4 0.7 - 4.0 K/uL   Monocytes Relative 7 %   Monocytes Absolute 0.4 0.1 - 1.0 K/uL   Eosinophils Relative 1 %   Eosinophils Absolute 0.1 0.0 - 0.7 K/uL   Basophils Relative 0 %   Basophils Absolute 0.0 0.0 - 0.1 K/uL  Comprehensive metabolic panel     Status: None   Collection Time: 06/18/15  1:18 PM  Result Value Ref Range   Sodium 141 135 - 145 mmol/L   Potassium 3.7 3.5 - 5.1 mmol/L   Chloride 107 101 - 111 mmol/L   CO2 25 22 - 32 mmol/L   Glucose, Bld 70 65 - 99 mg/dL   BUN 9 6 - 20 mg/dL   Creatinine, Ser 0.94 0.44 - 1.00 mg/dL   Calcium 9.4 8.9 - 10.3 mg/dL   Total Protein 8.0 6.5 - 8.1 g/dL   Albumin 4.4 3.5 - 5.0 g/dL   AST 23 15 - 41 U/L   ALT 14 14 - 54 U/L   Alkaline Phosphatase 55 38 - 126 U/L   Total Bilirubin 0.8 0.3 - 1.2 mg/dL   GFR calc non Af Amer >60 >60 mL/min   GFR calc Af Amer >60 >60 mL/min    Comment: (NOTE) The eGFR has been calculated using the CKD EPI equation. This calculation has not been validated in all clinical situations. eGFR's persistently <60 mL/min signify possible Chronic Kidney Disease.    Anion gap 9 5 - 15  Troponin I     Status: None   Collection Time: 06/18/15  1:18 PM  Result Value Ref Range   Troponin I <0.03 <0.031 ng/mL    Comment:        NO INDICATION OF MYOCARDIAL INJURY.   Salicylate level  Status: None   Collection Time: 06/18/15  1:18 PM  Result Value Ref Range   Salicylate Lvl <3.1 2.8 - 30.0 mg/dL  Acetaminophen level     Status: Abnormal   Collection Time: 06/18/15  1:18 PM  Result Value Ref Range   Acetaminophen (Tylenol), Serum <10 (L) 10 - 30 ug/mL    Comment:        THERAPEUTIC CONCENTRATIONS VARY SIGNIFICANTLY. A RANGE OF 10-30 ug/mL MAY BE AN EFFECTIVE CONCENTRATION FOR MANY PATIENTS. HOWEVER, SOME  ARE BEST TREATED AT CONCENTRATIONS OUTSIDE THIS RANGE. ACETAMINOPHEN CONCENTRATIONS >150 ug/mL AT 4 HOURS AFTER INGESTION AND >50 ug/mL AT 12 HOURS AFTER INGESTION ARE OFTEN ASSOCIATED WITH TOXIC REACTIONS.   Urinalysis, Routine w reflex microscopic (not at [facility])     Status: Abnormal   Collection Time: 06/18/15  1:18 PM  Result Value Ref Range   Color, Urine YELLOW YELLOW   APPearance CLEAR CLEAR   Specific Gravity, Urine 1.004 (L) 1.005 - 1.030   pH 7.0 5.0 - 8.0   Glucose, UA NEGATIVE NEGATIVE mg/dL   Hgb urine dipstick NEGATIVE NEGATIVE   Bilirubin Urine NEGATIVE NEGATIVE   Ketones, ur NEGATIVE NEGATIVE mg/dL   Protein, ur NEGATIVE NEGATIVE mg/dL   Urobilinogen, UA 0.2 0.0 - 1.0 mg/dL   Nitrite NEGATIVE NEGATIVE   Leukocytes, UA SMALL (A) NEGATIVE  Urine rapid drug screen (hosp performed)     Status: None   Collection Time: 06/18/15  1:18 PM  Result Value Ref Range   Opiates NONE DETECTED NONE DETECTED   Cocaine NONE DETECTED NONE DETECTED   Benzodiazepines NONE DETECTED NONE DETECTED   Amphetamines NONE DETECTED NONE DETECTED   Tetrahydrocannabinol NONE DETECTED NONE DETECTED   Barbiturates NONE DETECTED NONE DETECTED    Comment:        DRUG SCREEN FOR MEDICAL PURPOSES ONLY.  IF CONFIRMATION IS NEEDED FOR ANY PURPOSE, NOTIFY LAB WITHIN 5 DAYS.        LOWEST DETECTABLE LIMITS FOR URINE DRUG SCREEN Drug Class       Cutoff (ng/mL) Amphetamine      1000 Barbiturate      200 Benzodiazepine   594 Tricyclics       585 Opiates          300 Cocaine          300 THC              50   Urine microscopic-add on     Status: Abnormal   Collection Time: 06/18/15  1:18 PM  Result Value Ref Range   Squamous Epithelial / LPF FEW (A) RARE   WBC, UA 0-2 <3 WBC/hpf   Bacteria, UA RARE RARE  POC urine preg, ED (not at [facility])     Status: None   Collection Time: 06/18/15  1:24 PM  Result Value Ref Range   Preg Test, Ur NEGATIVE NEGATIVE    Comment:        THE SENSITIVITY OF  THIS METHODOLOGY IS >24 mIU/mL   Acetaminophen level     Status: Abnormal   Collection Time: 06/18/15  5:16 PM  Result Value Ref Range   Acetaminophen (Tylenol), Serum <10 (L) 10 - 30 ug/mL    Comment:        THERAPEUTIC CONCENTRATIONS VARY SIGNIFICANTLY. A RANGE OF 10-30 ug/mL MAY BE AN EFFECTIVE CONCENTRATION FOR MANY PATIENTS. HOWEVER, SOME ARE BEST TREATED AT CONCENTRATIONS OUTSIDE THIS RANGE. ACETAMINOPHEN CONCENTRATIONS >150 ug/mL AT 4 HOURS AFTER INGESTION AND >50 ug/mL  AT 12 HOURS AFTER INGESTION ARE OFTEN ASSOCIATED WITH TOXIC REACTIONS.     Observation Level/Precautions:  15 minute checks  Laboratory:  Labs obtained while in ED, have been reviewed.   Psychotherapy:  Milieu, support   Medications: Agrees to an antidepressant trial- start PROZAC 20 mgrs QAM  Consultations:  SW for counseling and support group  Discharge Concerns:  Family stressors, environmental triggers, and safety  Estimated LOS:3-5 days  Other:     Psychological Evaluations: No   Treatment Plan Summary: Daily contact with patient to assess and evaluate symptoms and progress in treatment and Medication management  Medical Decision Making:  Review of Psycho-Social Stressors (1), Review or order clinical lab tests (1), Established Problem, Worsening (2) and Review of New Medication or Change in Dosage (2)  I certify that inpatient services furnished can reasonably be expected to improve the patient's condition.   Sahmya Arai 9/22/201612:18 PM

## 2015-06-19 NOTE — Progress Notes (Signed)
Adult Psychoeducational Group Note  Date:  06/19/2015 Time:  9:15 PM  Group Topic/Focus:  Wrap-Up Group:   The focus of this group is to help patients review their daily goal of treatment and discuss progress on daily workbooks.  Pt attended karaoke.  Adriana Dawson 06/19/2015, 10:25 PM

## 2015-06-20 NOTE — Progress Notes (Addendum)
[facility] MD Progress Note  06/20/2015 1:49 PM Adriana Dawson  MRN:  [id number] Subjective:   She reports feeling better, and states she is not feeling as depressed. Denies any lingering physical symptoms following her overdose. Denies any ongoing suicidal ideations.  Denies any medication side effects. Objective : I have discussed case with treatment team and have met with patient. Patient presents improved compared to her admission status . She is reporting feeling better, denies suicidal ideations and reports a sense of embarrassment and guilt about her overdose. States " I would never do something like that again". States husband has come to visit her on unit, and visit " went OK". As discussed with staff, patient is facing some significant stressors, such as Nursing School requirements, financial difficulties, and living in a small home with her parents, brother, husband. She states, however, that she feels more optimistic about her future and her ability to succeed . She is hoping for discharge home soon. At this time behavior calm, in good control, participative in milieu, going to groups. No medication side effects,. Principal Problem:  MDD , single episode, severe, no psychotic symptoms Diagnosis:   Patient Active Problem List   Diagnosis Date Noted  . MDD (major depressive disorder), single episode, severe , no psychosis [F32.2] 06/19/2015   Total Time spent with patient: 20 minutes   Past Medical History:  Past Medical History  Diagnosis Date  . Depression    History reviewed. No pertinent past surgical history. Family History: History reviewed. No pertinent family history. Social History:  History  Alcohol Use  . Yes    Comment: few drinks per week     History  Drug Use Not on file    Social History   Social History  . Marital Status: Married    Spouse Name: N/A  . Number of Children: N/A  . Years of Education: N/A   Social History Main Topics  . Smoking status: Never  Smoker   . Smokeless tobacco: None  . Alcohol Use: Yes     Comment: few drinks per week  . Drug Use: None  . Sexual Activity: Yes    Birth Control/ Protection: Condom   Other Topics Concern  . None   Social History Narrative   Additional History:    Sleep: improved   Appetite:  Good   Assessment:   Musculoskeletal: Strength & Muscle Tone: within normal limits Gait & Station: normal Patient leans: Backward   Psychiatric Specialty Exam: Physical Exam  ROS denies headache, denies shortness of breath, denies chest pain  Blood pressure 106/70, pulse 85, temperature 98 F (36.7 C), temperature source Oral, resp. rate 16, height 5' 3"  (1.6 m), weight 131 lb (59.421 kg), last menstrual period 06/06/2015.Body mass index is 23.21 kg/(m^2).  General Appearance: Well Groomed  Engineer, water::  Good  Speech:  Normal Rate  Volume:  Normal  Mood:  improved,less depressed   Affect:  Appropriate and fuller in range   Thought Process:  Linear  Orientation:  Full (Time, Place, and Person)  Thought Content:  no hallucinations,no delusions, less ruminative about stressors   Suicidal Thoughts:  No- at this time denies any SI or any self injurious ideations  Homicidal Thoughts:  No  Memory:  recent and remote intact   Judgement:  Other:  improved   Insight:  Present  Psychomotor Activity:  Normal  Concentration:  Good  Recall:  Good  Fund of Knowledge:Good  Language: Good  Akathisia:  Negative  Handed:  Right  AIMS (if indicated):     Assets:  Communication Skills Desire for Improvement Resilience Social Support  ADL's:  Improved   Cognition: intact   Sleep:        Current Medications: Current Facility-Administered Medications  Medication Dose Route Frequency Provider Last Rate Last Dose  . acetaminophen (TYLENOL) tablet 650 mg  650 mg Oral Q6H PRN Laverle Hobby, PA-C      . alum & mag hydroxide-simeth (MAALOX/MYLANTA) 200-200-20 MG/5ML suspension 30 mL  30 mL Oral Q4H  PRN Laverle Hobby, PA-C      . FLUoxetine (PROZAC) capsule 20 mg  20 mg Oral Daily Jenne Campus, MD   20 mg at 06/20/15 7371  . hydrOXYzine (ATARAX/VISTARIL) tablet 25 mg  25 mg Oral Q6H PRN Laverle Hobby, PA-C      . magnesium hydroxide (MILK OF MAGNESIA) suspension 30 mL  30 mL Oral Daily PRN Laverle Hobby, PA-C      . traZODone (DESYREL) tablet 50 mg  50 mg Oral QHS,MR X 1 Spencer E Simon, PA-C   50 mg at 06/20/15 0000    Lab Results:  Results for orders placed or performed during the hospital encounter of 06/18/15 (from the past 48 hour(s))  Acetaminophen level     Status: Abnormal   Collection Time: 06/18/15  5:16 PM  Result Value Ref Range   Acetaminophen (Tylenol), Serum <10 (L) 10 - 30 ug/mL    Comment:        THERAPEUTIC CONCENTRATIONS VARY SIGNIFICANTLY. A RANGE OF 10-30 ug/mL MAY BE AN EFFECTIVE CONCENTRATION FOR MANY PATIENTS. HOWEVER, SOME ARE BEST TREATED AT CONCENTRATIONS OUTSIDE THIS RANGE. ACETAMINOPHEN CONCENTRATIONS >150 ug/mL AT 4 HOURS AFTER INGESTION AND >50 ug/mL AT 12 HOURS AFTER INGESTION ARE OFTEN ASSOCIATED WITH TOXIC REACTIONS.     Physical Findings: AIMS: Facial and Oral Movements Muscles of Facial Expression: None, normal Lips and Perioral Area: None, normal Jaw: None, normal Tongue: None, normal,Extremity Movements Upper (arms, wrists, hands, fingers): None, normal Lower (legs, knees, ankles, toes): None, normal, Trunk Movements Neck, shoulders, hips: None, normal, Overall Severity Severity of abnormal movements (highest score from questions above): None, normal Incapacitation due to abnormal movements: None, normal Patient's awareness of abnormal movements (rate only patient's report): No Awareness, Dental Status Current problems with teeth and/or dentures?: No Does patient usually wear dentures?: No  CIWA:  CIWA-Ar Total: 0 COWS:  COWS Total Score: 1   Assessment - patient presents improved compared to admission- less  depressed, less ruminative about her psychosocial stressors, and denying any ongoing suicidal ideations or self injurious thougths. She does remain anxious,although to a lesser degree .  She is tolerating Prozac trial well thus far. Behavior on unit calm, cooperative .   Treatment Plan Summary: Daily contact with patient to assess and evaluate symptoms and progress in treatment, Medication management, Plan inpatient admission and medications as below  Continue Prozac 20 mgrs QDAY for depression- importance of taking medication regularly, and therapeutic lag associated with antidepressants, side effect profile reviewed . Continue Trazodone 50 mgrs QHS for insomnia as needed  Continue Vistaril 25 mgrs Q 6 hours PRN for  Ongoing anxiety symptoms  as needed  Encourage ongoing milieu, group participation to work on coping skills and symptom improvement Treatment Team working on disposition planning - consider discharge soon if she continues to stabilize .   Medical Decision Making:  Established Problem, Stable/Improving (1), Review of Psycho-Social Stressors (1), Review or order clinical lab tests (  1) and Review of Medication Regimen & Side Effects (2)     COBOS, FERNANDO 06/20/2015, 1:49 PM

## 2015-06-20 NOTE — [facility] Group Notes (Signed)
[facility] LCSW Aftercare Discharge Planning Group Note  06/20/2015 8:45 AM  Participation Quality: Alert, Appropriate and Oriented  Mood/Affect: Appropriate; Improving mood  Depression Rating: 3  Anxiety Rating: 3  Thoughts of Suicide: Pt denies SI/HI  Will you contract for safety? Yes  Current AVH: Pt denies  Plan for Discharge/Comments: Pt attended discharge planning group and actively participated in group. CSW discussed suicide prevention education with the group and encouraged them to discuss discharge planning and any relevant barriers. Pt presents with brighter affect and reported enjoying karaoke last night. She verbalizes that she will probably DC tomorrow, per MD.  Transportation Means: Pt reports access to transportation  Supports: Husband  Chad Cordial, Theresia Majors 06/20/2015 9:14 AM

## 2015-06-20 NOTE — Progress Notes (Signed)
Adult Psychoeducational Group Note  Date:  06/20/2015 Time:  2:14 PM  Group Topic/Focus     :  Relapse Prevention Planning:   The focus of this group is to define relapse and discuss the need for planning to combat relapse.   Participation Level:  Active  Participation Quality:  Attentive and Sharing  Affect:  Appropriate  Cognitive:  Appropriate  Insight: Improving  Engagement in Group:  Engaged  Modes of Intervention:  Discussion  Additional Comments:Pt participated in group discussion this morning.  Pt states that she has been feeling overwhelmed with stress due to her brothers drug relapse and tension between her parents.  Pt's goal is to continue therapy and medication and eventually go to nursing school.  Pt has no feelings of harming herself or others.

## 2015-06-20 NOTE — Progress Notes (Signed)
Pt agreeable to [facility] referral. Jeri Modena, coordinator, is out of the office today. Outpatient staff unable to be reached to schedule assessment. Pt informed that [facility] will call her next week to schedule her assessment. Pt provided with list of outpatient providers for her reference.  Chad Cordial, LCSWA Clinical Social Work [phone]

## 2015-06-20 NOTE — [facility] Group Notes (Signed)
[facility] LCSW Group Therapy 06/20/2015 1:15pm  Type of Therapy: Group Therapy- Feelings Around Relapse and Recovery  Participation Level: Active   Participation Quality:  Appropriate  Affect:  Appropriate  Cognitive: Alert and Oriented   Insight:  Developing   Engagement in Therapy: Developing/Improving and Engaged   Modes of Intervention: Clarification, Confrontation, Discussion, Education, Exploration, Limit-setting, Orientation, Problem-solving, Rapport Building, Dance movement psychotherapist, Socialization and Support  Summary of Progress/Problems: The topic for today was feelings about relapse. The group discussed what relapse prevention is to them and identified triggers that they are on the path to relapse. Members also processed their feeling towards relapse and were able to relate to common experiences. Group also discussed coping skills that can be used for relapse prevention.     Therapeutic Modalities:   Cognitive Behavioral Therapy Solution-Focused Therapy Assertiveness Training Relapse Prevention Therapy    Lamar Sprinkles [phone] 06/20/2015 4:53 PM

## 2015-06-20 NOTE — Progress Notes (Signed)
Recreation Therapy Notes  Date: 09.23.2016 Time: 9:30am Location: [address] Group room   Group Topic: Stress Management  Goal Area(s) Addresses:  Patient will actively participate in stress management techniques presented during session.   Behavioral Response: Engaged, Appropriate   Intervention: Stress management techniques  Activity :  Deep Breathing and Progressive Muscle Relaxation. LRT provided instruction and demonstration on practice of Progressive Muscle Relaxation. Technique was coupled with deep breathing.   Education:  Stress Management, Discharge Planning.   Education Outcome: Acknowledges education  Clinical Observations/Feedback: Patient actively participated in technique introduced, expressing no concerns and demonstrating ability to practice independently post d/c.    Adriana Dawson, LRT/CTRS        Dawson, Denise L 06/20/2015 1:54 PM

## 2015-06-20 NOTE — Progress Notes (Signed)
D. Pt has been visible in the milieu and has been interacting with peers and staff Pt present with jovial and bright affect on the unit. Pt denies physical and no other complain has been reported. Pt stated her goal for today is " discharge plan, techniques to think positive." Pt reported that her depression was a 3, her hopelessness was a 3, and that her anxiety was a 3. Pt reported being negative SI/HI, no AH/VH noted. A: 15 min checks continued for patient safety. R: Pts safety maintained.

## 2015-06-20 NOTE — [facility] Suicide Risk Assessment (Signed)
[facility] INPATIENT:  Family/Significant Other Suicide Prevention Education  Suicide Prevention Education:  Education Completed: Jonelle Sidle (husband) [phone] has been identified by the patient as the family member/significant other with whom the patient will be residing, and identified as the person(s) who will aid the patient in the event of a mental health crisis (suicidal ideations/suicide attempt).  With written consent from the patient, the family member/significant other has been provided the following suicide prevention education, prior to the and/or following the discharge of the patient.  The suicide prevention education provided includes the following:  Suicide risk factors  Suicide prevention and interventions  National Suicide Hotline telephone number  [facility] assessment telephone number  [facility] Emergency Assistance 911  [facility] and/or Residential Mobile Crisis Unit telephone number  Request made of family/significant other to:  Remove weapons (e.g., guns, rifles, knives), all items previously/currently identified as safety concern.    Remove drugs/medications (over-the-counter, prescriptions, illicit drugs), all items previously/currently identified as a safety concern.  The family member/significant other verbalizes understanding of the suicide prevention education information provided.  The family member/significant other agrees to remove the items of safety concern listed above.  Jonathon Jordan 06/20/2015, 11:21 AM

## 2015-06-20 NOTE — Progress Notes (Signed)
Adult Psychoeducational Group Note  Date:  06/20/2015 Time:  8:58 PM  Group Topic/Focus:  Wrap-Up Group:   The focus of this group is to help patients review their daily goal of treatment and discuss progress on daily workbooks.  Participation Level:  Active  Participation Quality:  Appropriate and Attentive  Affect:  Appropriate  Cognitive:  Appropriate  Insight: Appropriate and Good  Engagement in Group:  Engaged  Modes of Intervention:  Education  Additional Comments:  Pt relapse prevention goal is to stay on medication and attend group meetings. Pt outside goal is to finish school and go back to work.   Merlinda Frederick 06/20/2015, 8:58 PM

## 2015-06-21 MED ORDER — FLUOXETINE HCL 20 MG PO CAPS: 20.0000 mg | ORAL_CAPSULE | Freq: Every day | ORAL | Status: DC

## 2015-06-21 NOTE — Progress Notes (Signed)
Patient ID: Adriana Dawson, female   DOB: June 09, 1986, 29 y.o.   MRN: [id number]   Pt currently presents with a pleasant affect and anxious behavior. Per self inventory, pt rates depression, hopelessness and anxiety at a 2. Pt's daily goal is "my discharge plan" and they intend to do so by "be positive." Pt reports good sleep, a good appetite, normal energy and good concentration. Pt reports that she is looking forward to going home with her husband.    Pt provided with medications per providers orders. Pt's labs and vitals were monitored throughout the day. Pt supported emotionally and encouraged to express concerns and questions. Pt educated on scheduled and prn medications. Pt encouraged to continue to follow up with personal provider post discharge.   Pt's safety ensured with 15 minute and environmental checks. Pt currently denies SI/HI and A/V hallucinations. Pt verbally agrees to seek staff if SI/HI or A/VH occurs and to consult with staff before acting on these thoughts. Pt to be discharged today. Will continue POC.

## 2015-06-21 NOTE — Progress Notes (Signed)
Patient ID: Adriana Dawson, female   DOB: 1986/03/23, 29 y.o.   MRN: [id number]  Pt discharged home with her husband. Pt was stable and appreciative at that time. All papers and prescriptions were given and valuables returned. Verbal understanding expressed. Denies SI/HI and A/VH. Pt given opportunity to express concerns and ask questions.

## 2015-06-21 NOTE — Discharge Summary (Signed)
Physician Discharge Summary Note  Patient:  Adriana Dawson is an 29 y.o., female MRN:  [id number] DOB:  1986/05/21 Patient phone:  [phone] (home)  [place] address[place]   [address]  [id number]  Total Time spent with patient: 30 minutes  Date of Admission:  06/19/2015 Date of Discharge: 06/21/2015  Reason for Admission:   29year old female, reports she was feeling verwhelmed currently - [place] and is going to school(nursing), looking for jobs and is just " very Stressed" . She also reports having arguments with her husband about their financial situation. During argument she told him she was going to kill herself. Impulsively she went to a drugstore , picked up OTC sleep meds and admits to taking 20 tabs prior to ER arrival. She states that after taking them she felt very drowsy and told husband who brought her to hospital. These events occurred yesterday. She reports depression and neuro vegetative symptoms as below. . She has never seen a psychiatrist. She also feels that she has not quite moved on from her divorce from 5 years ago, and states she feels part of the divorce was her fault. She is sadden that he was able to move on so easily and get married but she has not been able to(was deeply attracted to him). She had SI when she was getting divorced, but she has never made any attempts until now.  Principal Problem: MDD Discharge Diagnoses: Patient Active Problem List   Diagnosis Date Noted  . MDD (major depressive disorder), single episode, severe , no psychosis [F32.2] 06/19/2015    Musculoskeletal: Strength & Muscle Tone: within normal limits Gait & Station: normal Patient leans: N/A  Psychiatric Specialty Exam: Physical Exam  Constitutional: She is oriented to person, place, and time. She appears well-developed.  HENT:  Head: Normocephalic.  Eyes: Pupils are equal, round, and reactive to light.  Neck: Normal  range of motion.  Cardiovascular: Normal rate.   Respiratory: Effort normal.  GI: Soft.  Musculoskeletal: Normal range of motion.  Neurological: She is alert and oriented to person, place, and time.  Skin: Skin is warm and dry.  Psychiatric: She has a normal mood and affect. Her behavior is normal. Judgment and thought content normal.    Review of Systems  Psychiatric/Behavioral: Positive for depression (improving). Negative for suicidal ideas, hallucinations, memory loss and substance abuse. The patient is not nervous/anxious and does not have insomnia (slept well no medication needed.).   All other systems reviewed and are negative.   Blood pressure 95/68, pulse 110, temperature 97.9 F (36.6 C), temperature source Oral, resp. rate 18, height _0  (1.6 m), weight 59.421 kg (131 lb), last menstrual period 06/06/2015.Body mass index is 23.21 kg/(m^2).    See MD SRA                                                   Have you used any form of tobacco in the last 30 days? (Cigarettes, Smokeless Tobacco, Cigars, and/or Pipes): No  Has this patient used any form of tobacco in the last 30 days? (Cigarettes, Smokeless Tobacco, Cigars, and/or Pipes) No  Past Medical History:  Past Medical History  Diagnosis Date  . Depression    History reviewed. No pertinent past surgical history. Family History:  History reviewed. No pertinent family history. Social History:  History  Alcohol Use  . Yes    Comment: few drinks per week     History  Drug Use Not on file    Social History   Social History  . Marital Status: Married    Spouse Name: N/A  . Number of Children: N/A  . Years of Education: N/A   Social History Main Topics  . Smoking status: Never Smoker   . Smokeless tobacco: None  . Alcohol Use: Yes     Comment: few drinks per week  . Drug Use: None  . Sexual Activity: Yes    Birth Control/ Protection: Condom   Other Topics Concern  . None   Social  History Narrative    Past Psychiatric History: Hospitalizations: None  Outpatient Care:None  Substance Abuse Care: None  Self-Mutilation: none  Suicidal Attempts: None  Violent Behaviors:None   Risk to Self: Is patient at risk for suicide?: Yes What has been your use of drugs/alcohol within the last 12 months?: ETOH use 1-2 times a week Risk to Others:   Prior Inpatient Therapy:   Prior Outpatient Therapy:    Level of Care:  OP  Hospital Course:  Deloise Marchant was admitted for MDD  and crisis management. She was treated discharged with the medications listed below under Medication List.  Medical problems were identified and treated as needed.  Home medications were restarted as appropriate.  Improvement was monitored by observation and Chiquita Loth daily report of symptom reduction.  Emotional and mental status was monitored by daily self-inventory reports completed by Chiquita Loth and clinical staff.         Annabeth Tortora was evaluated by the treatment team for stability and plans for continued recovery upon discharge.  Zellie Jenning motivation was an integral factor for scheduling further treatment.  Employment, transportation, bed availability, health status, family support, and any pending legal issues were also considered during her hospital stay.  Marlana Salvage was offered further treatment options upon discharge including but not limited to Residential, Intensive Outpatient, and Outpatient treatment.  Jonasia Coiner will follow up with the services as listed below under Follow Up Information.     Upon completion of this admission the patient was both mentally and medically stable for discharge denying suicidal/homicidal ideation, auditory/visual/tactile hallucinations, delusional thoughts and paranoia.      Consults:  None  Significant Diagnostic Studies:  labs: All labs were reviewed and found to be normal.   Discharge Vitals:   Blood pressure 95/68, pulse 110, temperature 97.9 F (36.6 C),  temperature source Oral, resp. rate 18, height _0  (1.6 m), weight 59.421 kg (131 lb), last menstrual period 06/06/2015. Body mass index is 23.21 kg/(m^2). Lab Results:   Results for orders placed or performed during the hospital encounter of 06/18/15 (from the past 72 hour(s))  CBC with Differential/Platelet     Status: Abnormal   Collection Time: 06/18/15  1:18 PM  Result Value Ref Range   WBC 5.5 4.0 - 10.5 K/uL   RBC 5.13 (H) 3.87 - 5.11 MIL/uL   Hemoglobin 13.6 12.0 - 15.0 g/dL   HCT 40.6 36.0 - 46.0 %   MCV 79.1 78.0 - 100.0 fL   MCH 26.5 26.0 - 34.0 pg   MCHC 33.5 30.0 - 36.0 g/dL   RDW 13.0 11.5 - 15.5 %   Platelets 212 150 - 400 K/uL   Neutrophils Relative % 48 %   Neutro Abs 2.7 1.7 -  7.7 K/uL   Lymphocytes Relative 44 %   Lymphs Abs 2.4 0.7 - 4.0 K/uL   Monocytes Relative 7 %   Monocytes Absolute 0.4 0.1 - 1.0 K/uL   Eosinophils Relative 1 %   Eosinophils Absolute 0.1 0.0 - 0.7 K/uL   Basophils Relative 0 %   Basophils Absolute 0.0 0.0 - 0.1 K/uL  Comprehensive metabolic panel     Status: None   Collection Time: 06/18/15  1:18 PM  Result Value Ref Range   Sodium 141 135 - 145 mmol/L   Potassium 3.7 3.5 - 5.1 mmol/L   Chloride 107 101 - 111 mmol/L   CO2 25 22 - 32 mmol/L   Glucose, Bld 70 65 - 99 mg/dL   BUN 9 6 - 20 mg/dL   Creatinine, Ser 0.94 0.44 - 1.00 mg/dL   Calcium 9.4 8.9 - 10.3 mg/dL   Total Protein 8.0 6.5 - 8.1 g/dL   Albumin 4.4 3.5 - 5.0 g/dL   AST 23 15 - 41 U/L   ALT 14 14 - 54 U/L   Alkaline Phosphatase 55 38 - 126 U/L   Total Bilirubin 0.8 0.3 - 1.2 mg/dL   GFR calc non Af Amer >60 >60 mL/min   GFR calc Af Amer >60 >60 mL/min    Comment: (NOTE) The eGFR has been calculated using the CKD EPI equation. This calculation has not been validated in all clinical situations. eGFR's persistently <60 mL/min signify possible Chronic Kidney Disease.    Anion gap 9 5 - 15  Troponin I     Status: None   Collection Time: 06/18/15  1:18 PM  Result  Value Ref Range   Troponin I <0.03 <0.031 ng/mL    Comment:        NO INDICATION OF MYOCARDIAL INJURY.   Salicylate level     Status: None   Collection Time: 06/18/15  1:18 PM  Result Value Ref Range   Salicylate Lvl <8.9 2.8 - 30.0 mg/dL  Acetaminophen level     Status: Abnormal   Collection Time: 06/18/15  1:18 PM  Result Value Ref Range   Acetaminophen (Tylenol), Serum <10 (L) 10 - 30 ug/mL    Comment:        THERAPEUTIC CONCENTRATIONS VARY SIGNIFICANTLY. A RANGE OF 10-30 ug/mL MAY BE AN EFFECTIVE CONCENTRATION FOR MANY PATIENTS. HOWEVER, SOME ARE BEST TREATED AT CONCENTRATIONS OUTSIDE THIS RANGE. ACETAMINOPHEN CONCENTRATIONS >150 ug/mL AT 4 HOURS AFTER INGESTION AND >50 ug/mL AT 12 HOURS AFTER INGESTION ARE OFTEN ASSOCIATED WITH TOXIC REACTIONS.   Urinalysis, Routine w reflex microscopic (not at [facility])     Status: Abnormal   Collection Time: 06/18/15  1:18 PM  Result Value Ref Range   Color, Urine YELLOW YELLOW   APPearance CLEAR CLEAR   Specific Gravity, Urine 1.004 (L) 1.005 - 1.030   pH 7.0 5.0 - 8.0   Glucose, UA NEGATIVE NEGATIVE mg/dL   Hgb urine dipstick NEGATIVE NEGATIVE   Bilirubin Urine NEGATIVE NEGATIVE   Ketones, ur NEGATIVE NEGATIVE mg/dL   Protein, ur NEGATIVE NEGATIVE mg/dL   Urobilinogen, UA 0.2 0.0 - 1.0 mg/dL   Nitrite NEGATIVE NEGATIVE   Leukocytes, UA SMALL (A) NEGATIVE  Urine rapid drug screen (hosp performed)     Status: None   Collection Time: 06/18/15  1:18 PM  Result Value Ref Range   Opiates NONE DETECTED NONE DETECTED   Cocaine NONE DETECTED NONE DETECTED   Benzodiazepines NONE DETECTED NONE DETECTED   Amphetamines NONE DETECTED  NONE DETECTED   Tetrahydrocannabinol NONE DETECTED NONE DETECTED   Barbiturates NONE DETECTED NONE DETECTED    Comment:        DRUG SCREEN FOR MEDICAL PURPOSES ONLY.  IF CONFIRMATION IS NEEDED FOR ANY PURPOSE, NOTIFY LAB WITHIN 5 DAYS.        LOWEST DETECTABLE LIMITS FOR URINE DRUG SCREEN Drug Class        Cutoff (ng/mL) Amphetamine      1000 Barbiturate      200 Benzodiazepine   784 Tricyclics       696 Opiates          300 Cocaine          300 THC              50   Urine microscopic-add on     Status: Abnormal   Collection Time: 06/18/15  1:18 PM  Result Value Ref Range   Squamous Epithelial / LPF FEW (A) RARE   WBC, UA 0-2 <3 WBC/hpf   Bacteria, UA RARE RARE  POC urine preg, ED (not at [facility])     Status: None   Collection Time: 06/18/15  1:24 PM  Result Value Ref Range   Preg Test, Ur NEGATIVE NEGATIVE    Comment:        THE SENSITIVITY OF THIS METHODOLOGY IS >24 mIU/mL   Acetaminophen level     Status: Abnormal   Collection Time: 06/18/15  5:16 PM  Result Value Ref Range   Acetaminophen (Tylenol), Serum <10 (L) 10 - 30 ug/mL    Comment:        THERAPEUTIC CONCENTRATIONS VARY SIGNIFICANTLY. A RANGE OF 10-30 ug/mL MAY BE AN EFFECTIVE CONCENTRATION FOR MANY PATIENTS. HOWEVER, SOME ARE BEST TREATED AT CONCENTRATIONS OUTSIDE THIS RANGE. ACETAMINOPHEN CONCENTRATIONS >150 ug/mL AT 4 HOURS AFTER INGESTION AND >50 ug/mL AT 12 HOURS AFTER INGESTION ARE OFTEN ASSOCIATED WITH TOXIC REACTIONS.     Physical Findings: AIMS: Facial and Oral Movements Muscles of Facial Expression: None, normal Lips and Perioral Area: None, normal Jaw: None, normal Tongue: None, normal,Extremity Movements Upper (arms, wrists, hands, fingers): None, normal Lower (legs, knees, ankles, toes): None, normal, Trunk Movements Neck, shoulders, hips: None, normal, Overall Severity Severity of abnormal movements (highest score from questions above): None, normal Incapacitation due to abnormal movements: None, normal Patient's awareness of abnormal movements (rate only patient's report): No Awareness, Dental Status Current problems with teeth and/or dentures?: No Does patient usually wear dentures?: No  CIWA:  CIWA-Ar Total: 0 COWS:  COWS Total Score: 1   See Psychiatric Specialty Exam and Suicide  Risk Assessment completed by Attending Physician prior to discharge.  Discharge destination:  Home  Is patient on multiple antipsychotic therapies at discharge:  No   Has Patient had three or more failed trials of antipsychotic monotherapy by history:  No   Recommended Plan for Multiple Antipsychotic Therapies: NA     Medication List    TAKE these medications      Indication   BIOTIN 5000 PO  Take 1 tablet by mouth daily.      FLUoxetine 20 MG capsule  Commonly known as:  PROZAC  Take 1 capsule (20 mg total) by mouth daily.   Indication:  Major Depressive Disorder           Follow-up Information    Follow up with [facility].   Specialty:  Behavioral Health   Why:  Velva Harman will call you next week to schedule  your initial assessment for IOP.    Contact information:   [place] [phone]      Follow-up recommendations:   Follow-up Information    Follow up with [facility].   Specialty:  Behavioral Health   Why:  Velva Harman will call you next week to schedule your initial assessment for IOP.    Contact information:   [place] [phone]    Follow-up Recommendations as described.   Comments:  Take all medications as prescribed. Keep all follow-up appointments as scheduled.  Do not consume alcohol or use illegal drugs while on prescription medications. Report any adverse effects from your medications to your primary care provider promptly.  In the event of recurrent symptoms or worsening symptoms, call 911, a crisis hotline, or go to the nearest emergency department for evaluation.   Total Discharge Time: Greater than 30 minutes  Signed: Nanci Pina 06/21/2015, 10:12 AM   Patient seen face-to-face for this psychiatric evaluation, case discussed with physician extender, completed suicide risk  assessment for discharge and has safe disposition plan with the follow-up medication management.Reviewed the information documented and agree with the treatment plan.  JONNALAGADDA,JANARDHAHA R. 06/22/2015 3:46 PM

## 2015-06-21 NOTE — BHH Suicide Risk Assessment (Signed)
[facility] Discharge Suicide Risk Assessment   Demographic Factors:  Adolescent or young adult, Unemployed and Asian female  Total Time spent with patient: 30 minutes  Musculoskeletal: Strength & Muscle Tone: within normal limits Gait & Station: normal Patient leans: N/A  Psychiatric Specialty Exam: Physical Exam  ROS  Blood pressure 95/68, pulse 110, temperature 97.9 F (36.6 C), temperature source Oral, resp. rate 18, height  (1.6 m), weight 59.421 kg (131 lb), last menstrual period 06/06/2015.Body mass index is 23.21 kg/(m^2).  General Appearance: Casual  Eye Contact::  Good  Speech:  Clear and Coherent409  Volume:  Normal  Mood:  Depressed  Affect:  Congruent and Constricted  Thought Process:  Coherent and Goal Directed  Orientation:  Full (Time, Place, and Person)  Thought Content:  Rumination  Suicidal Thoughts:  No  Homicidal Thoughts:  No  Memory:  Immediate;   Good Recent;   Fair Remote;   Fair  Judgement:  Fair  Insight:  Good  Psychomotor Activity:  Normal  Concentration:  Good  Recall:  Good  Fund of Knowledge:Good  Language: Good  Akathisia:  Negative  Handed:  Right  AIMS (if indicated):     Assets:  Communication Skills Desire for Improvement Financial Resources/Insurance Housing Intimacy Leisure Time Physical Health Resilience Social Support Talents/Skills Transportation Vocational/Educational  Sleep:  Number of Hours: 5.75  Cognition: WNL  ADL's:  Intact   Have you used any form of tobacco in the last 30 days? (Cigarettes, Smokeless Tobacco, Cigars, and/or Pipes): No  Has this patient used any form of tobacco in the last 30 days? (Cigarettes, Smokeless Tobacco, Cigars, and/or Pipes) No  Mental Status Per Nursing Assessment::   On Admission:     Current Mental Status by Physician: She is feeling much better since admission and positvely responded to her medication treatment. She has no suicide or homicide ideation or psychosis.  Loss  Factors: NA  Historical Factors: NA  Risk Reduction Factors:   Responsible for children under 44 years of age, Sense of responsibility to family, Religious beliefs about death, Living with another person, especially a relative, Positive social support, Positive therapeutic relationship and Positive coping skills or problem solving skills  Continued Clinical Symptoms:  Severe Anxiety and/or Agitation Depression:   Recent sense of peace/wellbeing  Cognitive Features That Contribute To Risk:  Polarized thinking    Suicide Risk:  Minimal: No identifiable suicidal ideation.  Patients presenting with no risk factors but with morbid ruminations; may be classified as minimal risk based on the severity of the depressive symptoms  Principal Problem: <principal problem not specified> Discharge Diagnoses:  Patient Active Problem List   Diagnosis Date Noted  . MDD (major depressive disorder), single episode, severe , no psychosis [F32.2] 06/19/2015    Follow-up Information    Follow up with [facility].   Specialty:  Behavioral Health   Why:  [place] will call you next week to schedule your initial assessment for IOP.    Contact information:   [address] [place] [address] [phone]      Plan Of Care/Follow-up recommendations:  Activity:  as tolerated Diet:  regular  Is patient on multiple antipsychotic therapies at discharge:  No   Has Patient had three or more failed trials of antipsychotic monotherapy by history:  No  Recommended Plan for Multiple Antipsychotic Therapies: NA    JONNALAGADDA,JANARDHAHA R. 06/21/2015, 12:27 PM

## 2015-06-21 NOTE — Progress Notes (Signed)
  [facility] Adult Case Management Discharge Plan :  Will you be returning to the same living situation after discharge:  Yes,  home At discharge, do you have transportation home?: Yes,  family Do you have the ability to pay for your medications: Yes,  insurance  Release of information consent forms completed and in the chart;  Patient's signature needed at discharge.  Patient to Follow up at: Follow-up Information    Follow up with [facility].   Specialty:  Behavioral Health   Why:  [place] S[facility] will call you next week to schedule your initial assessment for IOP.    Contact information:   [address] [place] [address] [phone]      Patient denies SI/HI: Yes,  denies both    Safety Planning and Suicide Prevention discussed: Yes,  with husband  Have you used any form of tobacco in the last 30 days? (Cigarettes, Smokeless Tobacco, Cigars, and/or Pipes): No  Has patient been referred to the Quitline?: N/A patient is not a smoker  Clide Dales 06/21/2015, 9:48 AM

## 2015-06-21 NOTE — Progress Notes (Signed)
Adult Psychoeducational Group Note  Date:  06/21/2015 Time:  10:00am  Group Topic/Focus:  Identifying Needs:   The focus of this group is to help patients identify their personal needs that have been historically problematic and identify healthy behaviors to address their needs.  Participation Level:  Active  Participation Quality:  Appropriate  Affect:  Appropriate  Cognitive:  Alert and Oriented  Insight: Appropriate  Engagement in Group:  Engaged  Modes of Intervention:  Activity, Discussion, Education and Support  Additional Comments: Pt able to identify and express appropriately what precipitated her admission to [facility] and her current energy level.    Adriana Dawson 06/21/2015, 10:37 AM

## 2015-06-21 NOTE — BHH Group Notes (Signed)
B[facility] LCSW Group Therapy  06/21/2015  11:10 AM  Type of Therapy:  Group Therapy  Participation Level:  Active  Participation Quality:  Appropriate  Affect:  Appropriate  Cognitive:  Appropriate  Insight:  Engaged  Engagement in Therapy:  Engaged  Modes of Intervention:  Clarification, Exploration, Rapport Building, Socialization and Support  Summary of Progress/Problems:    Summary of Progress/Problems: The main focus of today's process group was for the patient to identify ways in which they have in the past sabotaged their own recovery. Motivational Interviewing was utilized to ask the group members what they get out of their substance use, and what reasons they may have for wanting to change. The Stages of Change were explained using a handout, and patients identified where they currently are with regard to stages of change. The patient was able to provide good examples for other group members of on how to avoid conflict and drama with others while also admitting that she has tendency to isolate. While she intends to keep up practice of yoga and running she is actively planning to attend therapy sessions also.    Carney Bern, LCSW

## 2015-06-21 NOTE — Progress Notes (Signed)
Patient has been up and active on the unit, attended group this evening and has voiced no complaints. Patient currently denies having pain, -si/hi/a/v hall. Support and encouragement offered, safety maintained on unit, will continue to monitor. She is hopeful to discharge on tomorrow d/t missing 2 days of school.

## 2015-06-21 NOTE — BHH Group Notes (Signed)
BHH Group Notes:  (Nursing/MHT/Case Management/Adjunct)  Date:  06/21/2015  Time:  9:28 AM  Type of Therapy:  Psychoeducational Skills  Participation Level:  Active  Participation Quality:  Appropriate  Affect:  Appropriate  Cognitive:  Appropriate  Insight:  Appropriate  Engagement in Group:  Engaged  Modes of Intervention:  Discussion  Summary of Progress/Problems: Pt did attend self inventory group.    Forrest, Shalita Shanta 06/21/2015, 9:28 AM 

## 2015-09-28 DIAGNOSIS — F4310 Post-traumatic stress disorder, unspecified: Secondary | ICD-10-CM

## 2015-09-28 HISTORY — DX: Post-traumatic stress disorder, unspecified: F43.10

## 2015-11-01 IMAGING — CR DG CHEST 1V
1 series · 1 of 1 positions shown · non-contrast
Comparison: None in PACs

CLINICAL DATA: Positive PPD, no chest complaints

EXAM:
CHEST  1 VIEW

[w chest pa]
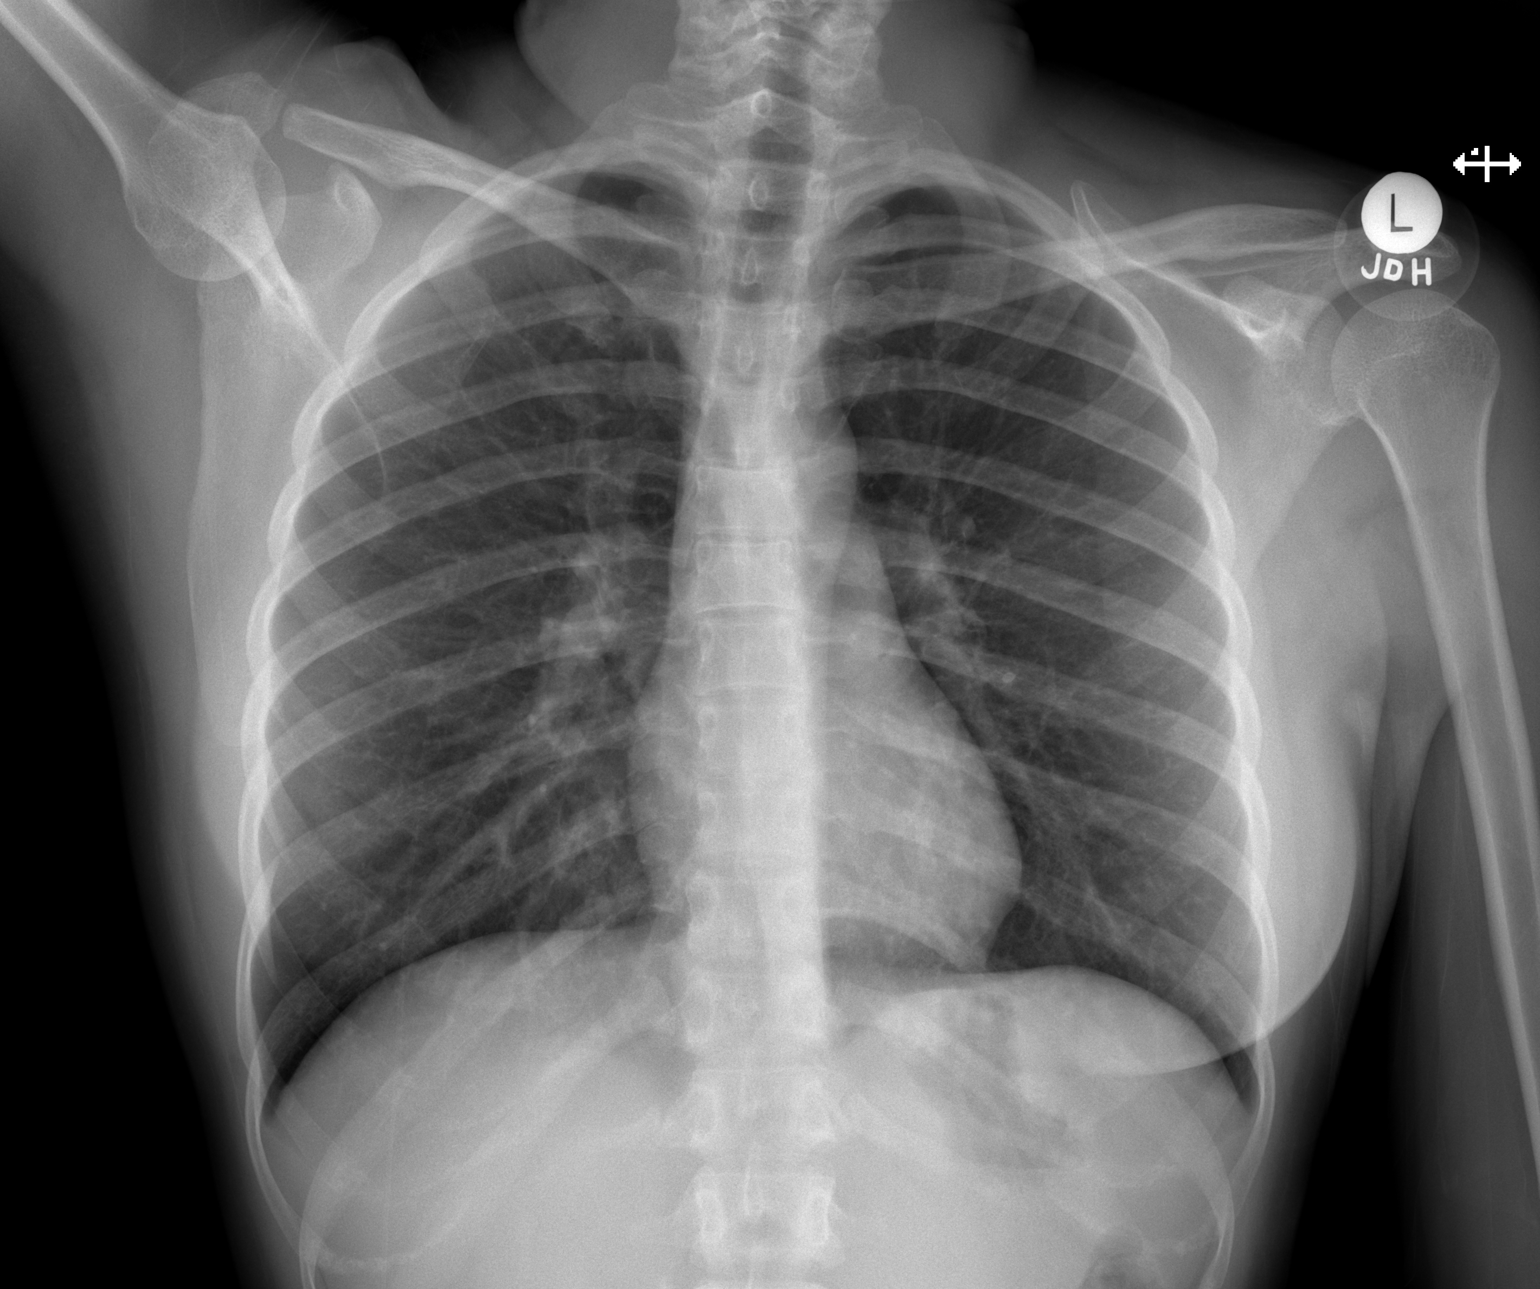

[1 of 1 positions shown; findings below may reference images not displayed]

FINDINGS: The lungs are adequately inflated and clear. The heart and pulmonary
vascularity are normal. The mediastinum is normal in width. There is
no pleural effusion. There is gentle dextrocurvature centered at the
midportion of the thoracic spine.
IMPRESSION: There is no evidence of acute or old tuberculous infection nor other
acute cardiopulmonary abnormality.

## 2016-01-26 DIAGNOSIS — L219 Seborrheic dermatitis, unspecified: Secondary | ICD-10-CM | POA: Diagnosis not present

## 2016-01-26 DIAGNOSIS — L659 Nonscarring hair loss, unspecified: Secondary | ICD-10-CM | POA: Diagnosis not present

## 2016-01-26 DIAGNOSIS — L649 Androgenic alopecia, unspecified: Secondary | ICD-10-CM | POA: Diagnosis not present

## 2016-02-03 DIAGNOSIS — M9905 Segmental and somatic dysfunction of pelvic region: Secondary | ICD-10-CM | POA: Diagnosis not present

## 2016-02-03 DIAGNOSIS — M5387 Other specified dorsopathies, lumbosacral region: Secondary | ICD-10-CM | POA: Diagnosis not present

## 2016-02-03 DIAGNOSIS — Q72811 Congenital shortening of right lower limb: Secondary | ICD-10-CM | POA: Diagnosis not present

## 2016-02-03 DIAGNOSIS — M9903 Segmental and somatic dysfunction of lumbar region: Secondary | ICD-10-CM | POA: Diagnosis not present

## 2016-02-05 DIAGNOSIS — M5387 Other specified dorsopathies, lumbosacral region: Secondary | ICD-10-CM | POA: Diagnosis not present

## 2016-02-05 DIAGNOSIS — Q72811 Congenital shortening of right lower limb: Secondary | ICD-10-CM | POA: Diagnosis not present

## 2016-02-05 DIAGNOSIS — M9905 Segmental and somatic dysfunction of pelvic region: Secondary | ICD-10-CM | POA: Diagnosis not present

## 2016-02-05 DIAGNOSIS — M9903 Segmental and somatic dysfunction of lumbar region: Secondary | ICD-10-CM | POA: Diagnosis not present

## 2016-02-09 DIAGNOSIS — Q72811 Congenital shortening of right lower limb: Secondary | ICD-10-CM | POA: Diagnosis not present

## 2016-02-09 DIAGNOSIS — M5387 Other specified dorsopathies, lumbosacral region: Secondary | ICD-10-CM | POA: Diagnosis not present

## 2016-02-09 DIAGNOSIS — M9905 Segmental and somatic dysfunction of pelvic region: Secondary | ICD-10-CM | POA: Diagnosis not present

## 2016-02-09 DIAGNOSIS — M9903 Segmental and somatic dysfunction of lumbar region: Secondary | ICD-10-CM | POA: Diagnosis not present

## 2016-02-12 DIAGNOSIS — Q72811 Congenital shortening of right lower limb: Secondary | ICD-10-CM | POA: Diagnosis not present

## 2016-02-12 DIAGNOSIS — M9905 Segmental and somatic dysfunction of pelvic region: Secondary | ICD-10-CM | POA: Diagnosis not present

## 2016-02-12 DIAGNOSIS — M9903 Segmental and somatic dysfunction of lumbar region: Secondary | ICD-10-CM | POA: Diagnosis not present

## 2016-02-12 DIAGNOSIS — M5387 Other specified dorsopathies, lumbosacral region: Secondary | ICD-10-CM | POA: Diagnosis not present

## 2016-03-08 DIAGNOSIS — R61 Generalized hyperhidrosis: Secondary | ICD-10-CM | POA: Diagnosis not present

## 2016-03-08 DIAGNOSIS — L219 Seborrheic dermatitis, unspecified: Secondary | ICD-10-CM | POA: Diagnosis not present

## 2016-03-08 DIAGNOSIS — L659 Nonscarring hair loss, unspecified: Secondary | ICD-10-CM | POA: Diagnosis not present

## 2016-03-18 DIAGNOSIS — M47819 Spondylosis without myelopathy or radiculopathy, site unspecified: Secondary | ICD-10-CM | POA: Diagnosis not present

## 2016-03-27 DIAGNOSIS — T7421XA Adult sexual abuse, confirmed, initial encounter: Secondary | ICD-10-CM

## 2016-03-27 HISTORY — DX: Adult sexual abuse, confirmed, initial encounter: T74.21XA

## 2016-04-05 DIAGNOSIS — Z713 Dietary counseling and surveillance: Secondary | ICD-10-CM | POA: Diagnosis not present

## 2016-04-16 ENCOUNTER — Encounter (HOSPITAL_COMMUNITY): Payer: Self-pay | Admitting: *Deleted

## 2016-04-16 ENCOUNTER — Ambulatory Visit (HOSPITAL_COMMUNITY)
Admission: EM | Admit: 2016-04-16 | Discharge: 2016-04-16 | Disposition: A | Discharge status: Home / Self Care | Payer: No Typology Code available for payment source | Attending: Emergency Medicine | Admitting: Emergency Medicine

## 2016-04-16 ENCOUNTER — Emergency Department (HOSPITAL_COMMUNITY)
Admission: EM | Admit: 2016-04-16 | Discharge: 2016-04-16 | Disposition: A | Discharge status: Home / Self Care | Payer: Worker's Compensation | Attending: Emergency Medicine | Admitting: Emergency Medicine

## 2016-04-16 DIAGNOSIS — T7421XA Adult sexual abuse, confirmed, initial encounter: Secondary | ICD-10-CM | POA: Insufficient documentation

## 2016-04-16 DIAGNOSIS — Z0441 Encounter for examination and observation following alleged adult rape: Secondary | ICD-10-CM | POA: Diagnosis not present

## 2016-04-16 LAB — HEPATIC FUNCTION PANEL
ALBUMIN: 4.2 g/dL (ref 3.5–5.0)
ALK PHOS: 48 U/L (ref 38–126)
ALT: 15 U/L (ref 14–54)
AST: 21 U/L (ref 15–41)
BILIRUBIN TOTAL: 1.1 mg/dL (ref 0.3–1.2)
Bilirubin, Direct: 0.1 mg/dL (ref 0.1–0.5)
Indirect Bilirubin: 1 mg/dL — ABNORMAL HIGH (ref 0.3–0.9)
TOTAL PROTEIN: 7.1 g/dL (ref 6.5–8.1)

## 2016-04-16 LAB — CBC WITH DIFFERENTIAL/PLATELET
BASOS PCT: 0 %
Basophils Absolute: 0 10*3/uL (ref 0.0–0.1)
Eosinophils Absolute: 0 10*3/uL (ref 0.0–0.7)
Eosinophils Relative: 0 %
HEMATOCRIT: 40.5 % (ref 36.0–46.0)
HEMOGLOBIN: 13.9 g/dL (ref 12.0–15.0)
LYMPHS ABS: 2.8 10*3/uL (ref 0.7–4.0)
Lymphocytes Relative: 37 %
MCH: 28 pg (ref 26.0–34.0)
MCHC: 34.3 g/dL (ref 30.0–36.0)
MCV: 81.7 fL (ref 78.0–100.0)
MONO ABS: 0.3 10*3/uL (ref 0.1–1.0)
MONOS PCT: 4 %
NEUTROS ABS: 4.4 10*3/uL (ref 1.7–7.7)
NEUTROS PCT: 59 %
Platelets: 187 10*3/uL (ref 150–400)
RBC: 4.96 MIL/uL (ref 3.87–5.11)
RDW: 12.7 % (ref 11.5–15.5)
WBC: 7.6 10*3/uL (ref 4.0–10.5)

## 2016-04-16 LAB — URINALYSIS, ROUTINE W REFLEX MICROSCOPIC
BILIRUBIN URINE: NEGATIVE
GLUCOSE, UA: NEGATIVE mg/dL
Hgb urine dipstick: NEGATIVE
KETONES UR: NEGATIVE mg/dL
NITRITE: NEGATIVE
PH: 6.5 (ref 5.0–8.0)
PROTEIN: NEGATIVE mg/dL
Specific Gravity, Urine: 1.009 (ref 1.005–1.030)

## 2016-04-16 LAB — BASIC METABOLIC PANEL
Anion gap: 9 (ref 5–15)
BUN: 6 mg/dL (ref 6–20)
CALCIUM: 9.7 mg/dL (ref 8.9–10.3)
CHLORIDE: 103 mmol/L (ref 101–111)
CO2: 27 mmol/L (ref 22–32)
CREATININE: 0.89 mg/dL (ref 0.44–1.00)
GFR calc Af Amer: >60 mL/min (ref >60)
GFR calc non Af Amer: >60 mL/min (ref >60)
GLUCOSE: 143 mg/dL — AB (ref 65–99)
Potassium: 3.9 mmol/L (ref 3.5–5.1)
Sodium: 139 mmol/L (ref 135–145)

## 2016-04-16 LAB — URINE MICROSCOPIC-ADD ON: RBC / HPF: NONE SEEN RBC/hpf (ref 0–5)

## 2016-04-16 LAB — LIPASE, BLOOD: Lipase: 35 U/L (ref 11–51)

## 2016-04-16 LAB — POC URINE PREG, ED: PREG TEST UR: NEGATIVE

## 2016-04-16 MED ORDER — ELVITEG-COBIC-EMTRICIT-TENOFAF 150-150-200-10 MG PO TABS
1.0000 | ORAL_TABLET | Freq: Every day | ORAL | Status: DC
  Administered 2016-04-16: 1 via ORAL
  Filled 2016-04-16: qty 1

## 2016-04-16 MED ORDER — DOLUTEGRAVIR SODIUM 50 MG PO TABS: 50.0000 mg | ORAL_TABLET | Freq: Every day | ORAL | Status: DC

## 2016-04-16 MED ORDER — METRONIDAZOLE 500 MG PO TABS
2000.0000 mg | ORAL_TABLET | Freq: Once | ORAL | Status: AC
  Administered 2016-04-16: 2000 mg via ORAL
  Filled 2016-04-16: qty 4

## 2016-04-16 MED ORDER — AZITHROMYCIN 250 MG PO TABS
1000.0000 mg | ORAL_TABLET | Freq: Once | ORAL | Status: AC
  Administered 2016-04-16: 1000 mg via ORAL
  Filled 2016-04-16: qty 4

## 2016-04-16 MED ORDER — LIDOCAINE HCL (PF) 1 % IJ SOLN
0.9000 mL | Freq: Once | INTRAMUSCULAR | Status: AC
  Administered 2016-04-16: 0.9 mL
  Filled 2016-04-16: qty 5

## 2016-04-16 MED ORDER — ULIPRISTAL ACETATE 30 MG PO TABS
30.0000 mg | ORAL_TABLET | Freq: Once | ORAL | Status: AC
  Administered 2016-04-16: 30 mg via ORAL
  Filled 2016-04-16: qty 1

## 2016-04-16 MED ORDER — EMTRICITABINE-TENOFOVIR AF 200-25 MG PO TABS: 1.0000 | ORAL_TABLET | Freq: Every day | ORAL | Status: DC

## 2016-04-16 MED ORDER — PROMETHAZINE HCL 25 MG PO TABS
25.0000 mg | ORAL_TABLET | Freq: Once | ORAL | Status: AC
  Administered 2016-04-16: 25 mg via ORAL
  Filled 2016-04-16: qty 1

## 2016-04-16 MED ORDER — CEFTRIAXONE SODIUM 250 MG IJ SOLR
250.0000 mg | Freq: Once | INTRAMUSCULAR | Status: AC
  Administered 2016-04-16: 250 mg via INTRAMUSCULAR
  Filled 2016-04-16: qty 250

## 2016-04-16 MED ORDER — ONDANSETRON 4 MG PO TBDP: 4.0000 mg | ORAL_TABLET | Freq: Three times a day (TID) | ORAL | Status: DC | PRN

## 2016-04-16 MED ORDER — ELVITEG-COBIC-EMTRICIT-TENOFAF 150-150-200-10 MG PO TABS: 1.0000 | ORAL_TABLET | Freq: Every day | ORAL | Status: DC
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
## 2016-04-16 NOTE — ED Notes (Addendum)
Pts guy friend upset because [facility] is not here to talk to pt yet.  Advised him [facility] was on the way and i did not know how long it was going to take them to arrive.  He stated "this is important"  I advised him that we deal with cases like this all the time, but i did not have any answers for him at the moment.  Pt also saying she has to urinate and i advised her she needed to hold it as long as possible.  I have tried to contact the SANE nurse multiple times regarding pt going to restroom and unable to get in touch with her.  Unable to find correct number.  Number I have is possibly office only number.

## 2016-04-16 NOTE — ED Notes (Signed)
Called pharmacy regarding needed medication

## 2016-04-16 NOTE — ED Provider Notes (Signed)
Care assumed from previous provider PA Nadeau. Please see note for further details. Case discussed, plan agreed upon. Will follow up on SANE nurse recommendations.   Sane nurse recommends prophylactic ABX treatment as well as ella which was ordered. [facility] outpatient follow up in 2 weeks.   Discussed case with ID per SANE nurse request given. Will also check cbc, bmp, lfts, hepatitis panel, and lipase. Will start patient on HIV post-exposure prophylaxis for one month, then patient will follow up with ID for reevaluation.   Case management consulted given patient's lack of insurance and high cost of medications. Program available to receive Genvoya one month free. [facility] and case management will send to pharmacy. Rx for zofran given for nausea.   Follow up care discussed with patient and included in discharge paperwork. All questions answered.    [facility]itusville Area HospitalJaime Pilcher Anayeli Arel, PA-C 04/16/16 2139  Blane OharaJoshua Zavitz, MD 04/17/16 [phone]
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
## 2016-04-16 NOTE — ED Notes (Signed)
Pt ambulated to room from waiting room. 
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
## 2016-04-16 NOTE — ED Notes (Signed)
Pt given UA cup and instructions to pat dry after urinating and then place the tissue paper in a provided bag to seal shut.

## 2016-04-16 NOTE — Care Management (Signed)
ED CM was called to assist with nPEP medication access. CM met with patient at bedside to complete the AAP form. Form completed and faxed to [facility] [phone] for 30 day free medication voucher. CM contacted [facility] on [address] regarding the availability of medication.  Prescription and voucher faxed to [facility]. Patient and husband made aware. Patient and husband informed to check with [facility] [address] when  medication is ready for pick up patient and husband verbalized understanding teach back done. No further questions or concerns verbalized.
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
## 2016-04-17 LAB — HEPATITIS PANEL, ACUTE
HCV Ab: <0.1 s/co ratio (ref 0.0–0.9)
Hep A IgM: NEGATIVE
Hep B C IgM: NEGATIVE
Hepatitis B Surface Ag: NEGATIVE

## 2016-04-17 LAB — RPR: RPR: NONREACTIVE

## 2016-04-17 LAB — HIV ANTIBODY (ROUTINE TESTING W REFLEX): HIV Screen 4th Generation wRfx: NONREACTIVE

## 2016-04-28 DIAGNOSIS — Z713 Dietary counseling and surveillance: Secondary | ICD-10-CM | POA: Diagnosis not present

## 2016-05-21 ENCOUNTER — Ambulatory Visit: Payer: BLUE CROSS/BLUE SHIELD

## 2016-05-24 ENCOUNTER — Telehealth: Payer: Self-pay | Admitting: *Deleted

## 2016-05-24 NOTE — Telephone Encounter (Signed)
Pharmacy called regarding prior authorization.  [facility]

## 2016-06-04 ENCOUNTER — Other Ambulatory Visit (HOSPITAL_COMMUNITY)
Admission: RE | Admit: 2016-06-04 | Discharge: 2016-06-04 | Disposition: A | Discharge status: Home / Self Care | Payer: BLUE CROSS/BLUE SHIELD | Source: Ambulatory Visit | Attending: Family Medicine | Admitting: Family Medicine

## 2016-06-04 ENCOUNTER — Ambulatory Visit: Payer: BLUE CROSS/BLUE SHIELD | Attending: Family Medicine | Admitting: Family Medicine

## 2016-06-04 ENCOUNTER — Encounter: Payer: Self-pay | Admitting: Family Medicine

## 2016-06-04 VITALS — BP 101/67 | HR 60 | Temp 98.3°F | Ht 63.0 in | Wt 133.6 lb

## 2016-06-04 DIAGNOSIS — Z113 Encounter for screening for infections with a predominantly sexual mode of transmission: Secondary | ICD-10-CM | POA: Diagnosis not present

## 2016-06-04 DIAGNOSIS — Z79899 Other long term (current) drug therapy: Secondary | ICD-10-CM | POA: Insufficient documentation

## 2016-06-04 DIAGNOSIS — T7421XA Adult sexual abuse, confirmed, initial encounter: Secondary | ICD-10-CM

## 2016-06-04 DIAGNOSIS — T7621XA Adult sexual abuse, suspected, initial encounter: Secondary | ICD-10-CM | POA: Insufficient documentation
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
## 2016-06-05 LAB — RPR

## 2016-06-05 LAB — HIV ANTIBODY (ROUTINE TESTING W REFLEX): HIV: NONREACTIVE

## 2016-06-08 LAB — URINE CYTOLOGY ANCILLARY ONLY
CHLAMYDIA, DNA PROBE: NEGATIVE
Neisseria Gonorrhea: NEGATIVE

## 2016-06-09 ENCOUNTER — Telehealth: Payer: Self-pay

## 2016-06-09 NOTE — Telephone Encounter (Signed)
-----   Message from Jaclyn ShaggyEnobong Amao, MD sent at 06/07/2016  9:14 AM EDT ----- Please inform the patient that labs are normal. Thank you.

## 2016-06-09 NOTE — Telephone Encounter (Signed)
Writer called patient per Dr. Amao to discuss lab results.  Patient stated understanding.  

## 2016-06-11 ENCOUNTER — Telehealth: Payer: Self-pay

## 2016-06-11 NOTE — Telephone Encounter (Signed)
-----   Message from Dessa PhiJosalyn Funches, MD sent at 06/10/2016  5:58 PM EDT ----- Negative screening GC/chlam

## 2016-06-11 NOTE — Telephone Encounter (Signed)
Writer called patient per Dr. Armen PickupFunches.  Writer informed patient that she had a negative screening for GC/chlamydia.

## 2016-11-25 DIAGNOSIS — L219 Seborrheic dermatitis, unspecified: Secondary | ICD-10-CM | POA: Diagnosis not present

## 2016-11-25 DIAGNOSIS — L659 Nonscarring hair loss, unspecified: Secondary | ICD-10-CM | POA: Diagnosis not present

## 2017-02-22 ENCOUNTER — Ambulatory Visit (INDEPENDENT_AMBULATORY_CARE_PROVIDER_SITE_OTHER): Payer: BLUE CROSS/BLUE SHIELD | Admitting: Physician Assistant

## 2017-02-22 ENCOUNTER — Ambulatory Visit (INDEPENDENT_AMBULATORY_CARE_PROVIDER_SITE_OTHER): Payer: BLUE CROSS/BLUE SHIELD

## 2017-02-22 ENCOUNTER — Encounter: Payer: Self-pay | Admitting: Physician Assistant

## 2017-02-22 VITALS — BP 127/85 | HR 82 | Temp 98.6°F | Resp 18 | Ht 63.5 in | Wt 133.6 lb

## 2017-02-22 DIAGNOSIS — R059 Cough, unspecified: Secondary | ICD-10-CM

## 2017-02-22 DIAGNOSIS — R05 Cough: Secondary | ICD-10-CM

## 2017-02-22 DIAGNOSIS — R7611 Nonspecific reaction to tuberculin skin test without active tuberculosis: Secondary | ICD-10-CM

## 2017-02-22 DIAGNOSIS — Z9289 Personal history of other medical treatment: Secondary | ICD-10-CM

## 2017-02-22 LAB — POCT CBC
Granulocyte percent: 61.1 %G (ref 37–80)
HCT, POC: 40.1 % (ref 37.7–47.9)
Hemoglobin: 14.7 g/dL (ref 12.2–16.2)
Lymph, poc: 3.8 — AB (ref 0.6–3.4)
MCH, POC: 31.9 pg — AB (ref 27–31.2)
MCHC: 36.7 g/dL — AB (ref 31.8–35.4)
MCV: 86.8 fL (ref 80–97)
MID (CBC): 1 — AB (ref 0–0.9)
MPV: 8.1 fL (ref 0–99.8)
PLATELET COUNT, POC: 222 10*3/uL (ref 142–424)
POC Granulocyte: 7.5 — AB (ref 2–6.9)
POC LYMPH %: 30.7 % (ref 10–50)
POC MID %: 8.2 % (ref 0–12)
RBC: 4.62 M/uL (ref 4.04–5.48)
RDW, POC: 12.3 %
WBC: 12.3 10*3/uL — AB (ref 4.6–10.2)

## 2017-02-22 MED ORDER — HYDROCODONE-HOMATROPINE 5-1.5 MG/5ML PO SYRP: 5.0000 mL | ORAL_SOLUTION | Freq: Every day | ORAL | 0 refills | Status: AC

## 2017-02-22 MED ORDER — BENZONATATE 200 MG PO CAPS: 200.0000 mg | ORAL_CAPSULE | Freq: Two times a day (BID) | ORAL | 0 refills | Status: DC | PRN

## 2017-02-22 NOTE — Progress Notes (Signed)
02/22/2017 6:26 PM   DOB: [birth year] / MRN: [id number]  SUBJECTIVE:  Adriana Dawson is a 31 y.o. female presenting for cough that started 7 days ago and has been worse at night. Denies fever, bloody cough.  She associates sore throat.  She feels that she is getting worse.  The cough is keeping her from going to sleep.  She is a never smoker and has no history of asthma.  She just had her cycle and has not been sexually active since.    She is allergic to other.   She  has a past medical history of Depression.    She  reports that she has never smoked. She has never used smokeless tobacco. She reports that she drinks about 0.6 - 1.2 oz of alcohol per week . She reports that she does not use drugs. She  reports that she currently engages in sexual activity. She reports using the following method of birth control/protection: Condom.The patient  has no past surgical history on file.  Her family history is not on file.  Review of Systems  Constitutional: Negative for chills and fever.  HENT: Negative for sore throat.   Respiratory: Positive for cough. Negative for hemoptysis, sputum production, shortness of breath and wheezing.   Cardiovascular: Negative for chest pain.  Gastrointestinal: Negative for nausea.  Musculoskeletal: Negative for myalgias.  Skin: Negative for rash.  Psychiatric/Behavioral: Negative for depression.    The problem list and medications were reviewed and updated by myself where necessary and exist elsewhere in the encounter.   OBJECTIVE:  BP 127/85 (BP Location: Left Arm, Patient Position: Sitting, Cuff Size: Normal)   Pulse 82   Temp 98.6 F (37 C) (Oral)   Resp 18   Ht 5' 3.5" (1.613 m)   Wt 133 lb 9.6 oz (60.6 kg)   LMP 01/24/2017   SpO2 98%   PF 400 L/min   BMI 23.29 kg/m   Physical Exam  Constitutional: She is active.  Non-toxic appearance.  Cardiovascular: Normal rate.   Pulmonary/Chest: Effort normal. No stridor. No tachypnea. No respiratory  distress. She has no wheezes. She has no rales. She exhibits no tenderness.  Neurological: She is alert.  Skin: Skin is warm and dry. She is not diaphoretic. No pallor.    Results for orders placed or performed in visit on 02/22/17 (from the past 72 hour(s))  POCT CBC     Status: Abnormal   Collection Time: 02/22/17  6:11 PM  Result Value Ref Range   WBC 12.3 (A) 4.6 - 10.2 K/uL   Lymph, poc 3.8 (A) 0.6 - 3.4   POC LYMPH PERCENT 30.7 10 - 50 %L   MID (cbc) 1.0 (A) 0 - 0.9   POC MID % 8.2 0 - 12 %M   POC Granulocyte 7.5 (A) 2 - 6.9   Granulocyte percent 61.1 37 - 80 %G   RBC 4.62 4.04 - 5.48 M/uL   Hemoglobin 14.7 12.2 - 16.2 g/dL   HCT, POC 40.940.1 81.137.7 - 47.9 %   MCV 86.8 80 - 97 fL   MCH, POC 31.9 (A) 27 - 31.2 pg   MCHC 36.7 (A) 31.8 - 35.4 g/dL   RDW, POC 91.412.3 %   Platelet Count, POC 222 142 - 424 K/uL   MPV 8.1 0 - 99.8 fL    Dg Chest 2 View  Result Date: 02/22/2017 CLINICAL DATA:  Cough, 7 days duration. Mildly productive of sputum. EXAM: CHEST  2 VIEW COMPARISON:  05/28/2015  FINDINGS: Heart size is normal. Mediastinal shadows are normal. The lungs are clear. No bronchial thickening. No infiltrate, mass, effusion or collapse. Pulmonary vascularity is normal. No bony abnormality. IMPRESSION: Normal chest Electronically Signed   By: Paulina Fusi M.D.   On: 02/22/2017 18:15    ASSESSMENT AND PLAN:  Keili was seen today for sore throat and cough.  Diagnoses and all orders for this visit:  Cough: Lymphoproleferative pattern on CBC suggesting viral etiology.  Rads WNL. Peak flow reassuring. No concerning medications. Will treat symptomatically.  Advise Aleve 440 bid.  Will see her back as needed for this.  -     Quantiferon tb gold assay -     POCT CBC -     DG Chest 2 View; Future  History of positive PPD: She had a positive PPD followed by a negative chest xray about 1 year ago. She is from [place] and is not sure if she had the BCG or not.  -     Quantiferon tb gold  assay    The patient is advised to call or return to clinic if she does not see an improvement in symptoms, or to seek the care of the closest emergency department if she worsens with the above plan.   Deliah Boston, MHS, PA-C [facility] 02/22/2017 6:26 PM

## 2017-02-22 NOTE — Patient Instructions (Addendum)
Take two aleve in the morning and night.      IF you received an x-ray today, you will receive an invoice from [facility]. Please contact [facility] at [phone] with questions or concerns regarding your invoice.   IF you received labwork today, you will receive an invoice from [facility]. Please contact [facility] at [phone] with questions or concerns regarding your invoice.   Our billing staff will not be able to assist you with questions regarding bills from these companies.  You will be contacted with the lab results as soon as they are available. The fastest way to get your results is to activate your My Chart account. Instructions are located on the last page of this paperwork. If you have not heard from us regarding the results in 2 weeks, please contact this office.

## 2017-02-24 LAB — QUANTIFERON IN TUBE
QFT TB AG MINUS NIL VALUE: 7.09 IU/mL
QUANTIFERON MITOGEN VALUE: 7.57 [IU]/mL
QUANTIFERON TB AG VALUE: 7.11 [IU]/mL
QUANTIFERON TB GOLD: POSITIVE — AB
Quantiferon Nil Value: 0.02 IU/mL

## 2017-02-24 LAB — QUANTIFERON TB GOLD ASSAY (BLOOD)

## 2017-02-28 ENCOUNTER — Telehealth: Payer: Self-pay | Admitting: Physician Assistant

## 2017-02-28 NOTE — Telephone Encounter (Signed)
Left message on machine for patient to call me back for discussion of labs.  Please forward her call to TL if possible so I can speak with her on the phone.   Deliah BostonMichael Conleigh Heinlein, MS, PA-C 1:56 PM, 02/28/2017

## 2017-03-01 ENCOUNTER — Ambulatory Visit (INDEPENDENT_AMBULATORY_CARE_PROVIDER_SITE_OTHER): Payer: BLUE CROSS/BLUE SHIELD | Admitting: Urgent Care

## 2017-03-01 ENCOUNTER — Ambulatory Visit (INDEPENDENT_AMBULATORY_CARE_PROVIDER_SITE_OTHER): Payer: BLUE CROSS/BLUE SHIELD

## 2017-03-01 VITALS — BP 115/77 | HR 80 | Temp 98.2°F | Resp 16 | Ht 62.5 in | Wt 131.2 lb

## 2017-03-01 DIAGNOSIS — R7612 Nonspecific reaction to cell mediated immunity measurement of gamma interferon antigen response without active tuberculosis: Secondary | ICD-10-CM

## 2017-03-01 DIAGNOSIS — R05 Cough: Secondary | ICD-10-CM

## 2017-03-01 DIAGNOSIS — R059 Cough, unspecified: Secondary | ICD-10-CM

## 2017-03-01 NOTE — Patient Instructions (Addendum)
Cough, Adult Coughing is a reflex that clears your throat and your airways. Coughing helps to heal and protect your lungs. It is normal to cough occasionally, but a cough that happens with other symptoms or lasts a long time may be a sign of a condition that needs treatment. A cough may last only 2-3 weeks (acute), or it may last longer than 8 weeks (chronic). What are the causes? Coughing is commonly caused by:  Breathing in substances that irritate your lungs.  A viral or bacterial respiratory infection.  Allergies.  Asthma.  Postnasal drip.  Smoking.  Acid backing up from the stomach into the esophagus (gastroesophageal reflux).  Certain medicines.  Chronic lung problems, including COPD (or rarely, lung cancer).  Other medical conditions such as heart failure. Follow these instructions at home: Pay attention to any changes in your symptoms. Take these actions to help with your discomfort:  Take medicines only as told by your health care provider.  If you were prescribed an antibiotic medicine, take it as told by your health care provider. Do not stop taking the antibiotic even if you start to feel better.  Talk with your health care provider before you take a cough suppressant medicine.  Drink enough fluid to keep your urine clear or pale yellow.  If the air is dry, use a cold steam vaporizer or humidifier in your bedroom or your home to help loosen secretions.  Avoid anything that causes you to cough at work or at home.  If your cough is worse at night, try sleeping in a semi-upright position.  Avoid cigarette smoke. If you smoke, quit smoking. If you need help quitting, ask your health care provider.  Avoid caffeine.  Avoid alcohol.  Rest as needed. Contact a health care provider if:  You have new symptoms.  You cough up pus.  Your cough does not get better after 2-3 weeks, or your cough gets worse.  You cannot control your cough with suppressant medicines  and you are losing sleep.  You develop pain that is getting worse or pain that is not controlled with pain medicines.  You have a fever.  You have unexplained weight loss.  You have night sweats. Get help right away if:  You cough up blood.  You have difficulty breathing.  Your heartbeat is very fast. This information is not intended to replace advice given to you by your health care provider. Make sure you discuss any questions you have with your health care provider. Document Released: 03/12/2011 Document Revised: 02/19/2016 Document Reviewed: 11/20/2014 Elsevier Interactive Patient Education  2017 Elsevier Inc.     IF you received an x-ray today, you will receive an invoice from [facility]. Please contact [facility] at [phone] with questions or concerns regarding your invoice.   IF you received labwork today, you will receive an invoice from [facility]. Please contact [facility] at [phone] with questions or concerns regarding your invoice.   Our billing staff will not be able to assist you with questions regarding bills from these companies.  You will be contacted with the lab results as soon as they are available. The fastest way to get your results is to activate your My Chart account. Instructions are located on the last page of this paperwork. If you have not heard from us regarding the results in 2 weeks, please contact this office.      

## 2017-03-01 NOTE — Progress Notes (Signed)
    MRN: [id number] DOB: 06-10-1986  Subjective:   Windell HummingbirdSweta Hubert is a 31 y.o. female presenting for follow up on cough. Patient was last seen 02/22/2017, treated for viral type illness and cough. Quantiferon test resulted positive. Chest x-ray from same date was negative. Patient last visited [place] 09/2015, was there for 10 days. Today, reports that she had some night sweats, productive cough as reported from her last visit. She is improved from treatment she started from her last visit. Denies chest pain, shob, wheezing, unexplained weight loss, rashes. Denies smoking cigarettes.  Emireth has a current medication list which includes the following prescription(s): vitamin c, benzonatate, biotin, ferrous fumarate, hydrocodone-homatropine, and paroxetine. Also is allergic to other. Kalanie  has a past medical history of Depression. Also denies past surgical history.  Objective:   Vitals: BP 115/77 (BP Location: Right Arm, Patient Position: Sitting, Cuff Size: Normal)   Pulse 80   Temp 98.2 F (36.8 C) (Oral)   Resp 16   Ht 5' 2.5" (1.588 m)   Wt 131 lb 3.2 oz (59.5 kg)   SpO2 100%   BMI 23.61 kg/m   Physical Exam  Constitutional: She is oriented to person, place, and time. She appears well-developed and well-nourished.  Eyes: No scleral icterus.  Neck: Normal range of motion. Neck supple.  Cardiovascular: Normal rate, regular rhythm and intact distal pulses.  Exam reveals no gallop and no friction rub.   No murmur heard. Pulmonary/Chest: No respiratory distress. She has no wheezes. She has no rales.  Lymphadenopathy:    She has no cervical adenopathy.  Neurological: She is alert and oriented to person, place, and time.  Skin: Skin is warm and dry.   Dg Chest 2 View  Result Date: 03/01/2017 CLINICAL DATA:  Cough, positive QuantiFERON test EXAM: CHEST  2 VIEW COMPARISON:  02/22/2017 FINDINGS: Normal heart size, mediastinal contours, and pulmonary vascularity. Lungs hyperinflated but clear.  No pleural effusion or pneumothorax. Bones unremarkable. IMPRESSION: Hyperinflated lungs without infiltrate. Electronically Signed   By: Ulyses SouthwardMark  Boles M.D.   On: 03/01/2017 16:34   Assessment and Plan :   This case was precepted with Dr. Katrinka BlazingSmith.   1. Cough 2. Positive QuantiFERON-TB Gold test - Will refer urgently to either the Health Department or [facility]. Patient excused from work given close contact with patients. Chest x-ray and physical exam findings are reassuring.   Wallis BambergMario Klaus Casteneda, PA-C [facility] [phone] 03/01/2017 4:22 PM

## 2017-03-01 NOTE — Telephone Encounter (Signed)
Pt called back, labs abnormal but no note to explain what to advise? Please call her back

## 2017-03-02 ENCOUNTER — Telehealth: Payer: Self-pay | Admitting: Urgent Care

## 2017-03-02 NOTE — Telephone Encounter (Signed)
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 Please call pt if she can pick the letter up at front desk. [phone]

## 2017-03-02 NOTE — Telephone Encounter (Signed)
Pt called asking about urgent referral sent for infectious diseases. Pt advised that referral was sent and phone number given. Pt also wanted to know what she is supposed to tell her work concerning this issue. Pt is supposed to go work tomorrow and Friday as well as next week and doesn't know what to tell them or when she is able to go back. She would like to know how long she is to wait for results after seeing specialist. If a note can be written for her to take to work she would like to pick this up. Please advise. Pt callback number is [phone].

## 2017-03-03 NOTE — Telephone Encounter (Signed)
Pt called stating that infectious disease center could not see her for a week. Pt advised to call health department and given phone number. Pt also wanted to know status of note and I told pt we would contact her when ready to pick up. Thanks!

## 2017-03-03 NOTE — Telephone Encounter (Signed)
Yes please compose.

## 2017-03-03 NOTE — Telephone Encounter (Signed)
Pt advised not up front

## 2017-03-03 NOTE — Telephone Encounter (Signed)
PATIENT WANTS MARIO TO KNOW THAT SHE IS GOING TO A FAMILY DINNER Friday (03/04/17). WHAT KIND OF PRECAUTIONS DOES SHE NEED TO TAKE AS FAR AS BEING CONTAGIOUS? BEST PHONE [phone] (CELL) MBC

## 2017-03-03 NOTE — Telephone Encounter (Signed)
Ok to write? 

## 2017-03-28 ENCOUNTER — Telehealth: Payer: Self-pay | Admitting: Infectious Disease

## 2017-03-28 NOTE — Telephone Encounter (Signed)
Received urgent referral re pt with cough and + QF  I am glad to see that Chest Xray was done  [facility] because then pt will get "credit" for being treated for LTB

## 2017-03-29 NOTE — Telephone Encounter (Signed)
Mani, PA-C, is away from the office for the next week, and I am watching his box in the interim. I will go ahead and alert our referral team for our office to [facility]. Thank you very much.

## 2017-03-30 ENCOUNTER — Telehealth: Payer: Self-pay | Admitting: Physician Assistant

## 2017-03-30 NOTE — Telephone Encounter (Signed)
[facility] dx and treatment.

## 2017-03-31 NOTE — Telephone Encounter (Signed)
Spoke with patient in regards to scheduling appointment @ [facility] TB clinic. Patient sates, she is going to schedule appointment this week.  Left a detailed message with Laney Potashammy Koonce, ID coordinator tof/u with patient and confirm appointment.  Office visit note, demographic info faxed to Tammy.

## 2017-03-31 NOTE — Telephone Encounter (Signed)
Great.  Thanks

## 2017-03-31 NOTE — Telephone Encounter (Signed)
Called pt on 7/3 and let her know the infectious disease center thought she would be better going to the health department. The Health Department does not need referrals so I let the pt know to call them to set up a time to come in.

## 2017-04-07 ENCOUNTER — Ambulatory Visit (INDEPENDENT_AMBULATORY_CARE_PROVIDER_SITE_OTHER): Payer: BLUE CROSS/BLUE SHIELD | Admitting: Physician Assistant

## 2017-04-07 ENCOUNTER — Ambulatory Visit (INDEPENDENT_AMBULATORY_CARE_PROVIDER_SITE_OTHER): Payer: BLUE CROSS/BLUE SHIELD

## 2017-04-07 ENCOUNTER — Encounter: Payer: Self-pay | Admitting: Physician Assistant

## 2017-04-07 VITALS — BP 118/79 | HR 70 | Temp 98.2°F | Resp 16 | Ht 62.5 in | Wt 140.2 lb

## 2017-04-07 DIAGNOSIS — R195 Other fecal abnormalities: Secondary | ICD-10-CM

## 2017-04-07 DIAGNOSIS — R197 Diarrhea, unspecified: Secondary | ICD-10-CM | POA: Diagnosis not present

## 2017-04-07 DIAGNOSIS — R635 Abnormal weight gain: Secondary | ICD-10-CM | POA: Diagnosis not present

## 2017-04-07 DIAGNOSIS — J029 Acute pharyngitis, unspecified: Secondary | ICD-10-CM | POA: Diagnosis not present

## 2017-04-07 DIAGNOSIS — R05 Cough: Secondary | ICD-10-CM

## 2017-04-07 DIAGNOSIS — R059 Cough, unspecified: Secondary | ICD-10-CM

## 2017-04-07 LAB — POCT CBC
GRANULOCYTE PERCENT: 58.1 % (ref 37–80)
HEMATOCRIT: 39.5 % (ref 37.7–47.9)
Hemoglobin: 13.4 g/dL (ref 12.2–16.2)
Lymph, poc: 2.4 (ref 0.6–3.4)
MCH, POC: 29.9 pg (ref 27–31.2)
MCHC: 33.9 g/dL (ref 31.8–35.4)
MCV: 88.4 fL (ref 80–97)
MID (cbc): 0.4 (ref 0–0.9)
MPV: 7.9 fL (ref 0–99.8)
POC GRANULOCYTE: 3.9 (ref 2–6.9)
POC LYMPH %: 35.5 % (ref 10–50)
POC MID %: 6.4 %M (ref 0–12)
Platelet Count, POC: 220 10*3/uL (ref 142–424)
RBC: 4.46 M/uL (ref 4.04–5.48)
RDW, POC: 12.4 %
WBC: 6.7 10*3/uL (ref 4.6–10.2)

## 2017-04-07 LAB — POCT URINALYSIS DIP (MANUAL ENTRY)
BILIRUBIN UA: NEGATIVE mg/dL
Bilirubin, UA: NEGATIVE
Glucose, UA: NEGATIVE mg/dL
Leukocytes, UA: NEGATIVE
Nitrite, UA: NEGATIVE
Protein Ur, POC: NEGATIVE mg/dL
SPEC GRAV UA: 1.025 (ref 1.010–1.025)
Urobilinogen, UA: 0.2 E.U./dL
pH, UA: 6 (ref 5.0–8.0)

## 2017-04-07 LAB — POCT RAPID STREP A (OFFICE): RAPID STREP A SCREEN: NEGATIVE

## 2017-04-07 MED ORDER — HYDROCODONE-HOMATROPINE 5-1.5 MG/5ML PO SYRP: 5.0000 mL | ORAL_SOLUTION | Freq: Three times a day (TID) | ORAL | 0 refills | Status: DC | PRN

## 2017-04-07 MED ORDER — DICYCLOMINE HCL 20 MG PO TABS: 20.0000 mg | ORAL_TABLET | Freq: Every day | ORAL | 0 refills | Status: DC

## 2017-04-07 MED ORDER — BENZONATATE 100 MG PO CAPS: 100.0000 mg | ORAL_CAPSULE | Freq: Three times a day (TID) | ORAL | 0 refills | Status: DC | PRN

## 2017-04-07 NOTE — Patient Instructions (Addendum)
For mixed diarrhea and constipation, try taking bentyl daily while we are awaiting lab results.  Please return the stool sample when you collect it. If you develop any worsening symptoms or new symptoms like fever, abdominal pain, nausea, or vomiting, please seek care immediately at the ED.    - For your cough, we will treat this as a respiratory viral infection.  - I recommend you rest, drink plenty of fluids, eat light meals including soups.  - You may use cough syrup at night for your cough and sore throat, Tessalon pearls during the day. Be aware that cough syrup can definitely make you drowsy and sleepy so do not drive or operate any heavy machinery if it is affecting you during the day.  - You may also use Tylenol or ibuprofen over-the-counter for your sore throat.  - Please let me know if you are not seeing any improvement or get worse in 5-7 days.   Bland Diet A bland diet consists of foods that do not have a lot of fat or fiber. Foods without fat or fiber are easier for the body to digest. They are also less likely to irritate your mouth, throat, stomach, and other parts of your gastrointestinal tract. A bland diet is sometimes called a BRAT diet. What is my plan? Your health care provider or dietitian may recommend specific changes to your diet to prevent and treat your symptoms, such as:  Eating small meals often.  Cooking food until it is soft enough to chew easily.  Chewing your food well.  Drinking fluids slowly.  Not eating foods that are very spicy, sour, or fatty.  Not eating citrus fruits, such as oranges and grapefruit.  What do I need to know about this diet?  Eat a variety of foods from the bland diet food list.  Do not follow a bland diet longer than you have to.  Ask your health care provider whether you should take vitamins. What foods can I eat? Grains  Hot cereals, such as cream of wheat. Bread, crackers, or tortillas made from refined white flour.  Rice. Vegetables Canned or cooked vegetables. Mashed or boiled potatoes. Fruits Bananas. Applesauce. Other types of cooked or canned fruit with the skin and seeds removed, such as canned peaches or pears. Meats and Other Protein Sources Scrambled eggs. Creamy peanut butter or other nut butters. Lean, well-cooked meats, such as chicken or fish. Tofu. Soups or broths. Dairy Low-fat dairy products, such as milk, cottage cheese, or yogurt. Beverages Water. Herbal tea. Apple juice. Sweets and Desserts Pudding. Custard. Fruit gelatin. Ice cream. Fats and Oils Mild salad dressings. Canola or olive oil. The items listed above may not be a complete list of allowed foods or beverages. Contact your dietitian for more options. What foods are not recommended? Foods and ingredients that are often not recommended include:  Spicy foods, such as hot sauce or salsa.  Fried foods.  Sour foods, such as pickled or fermented foods.  Raw vegetables or fruits, especially citrus or berries.  Caffeinated drinks.  Alcohol.  Strongly flavored seasonings or condiments.  The items listed above may not be a complete list of foods and beverages that are not allowed. Contact your dietitian for more information. This information is not intended to replace advice given to you by your health care provider. Make sure you discuss any questions you have with your health care provider. Document Released: 01/05/2016 Document Revised: 02/19/2016 Document Reviewed: 09/25/2014 Elsevier Interactive Patient Education  2018 [employer]  Inc[employer]  Upper Respiratory Infection, Adult Most upper respiratory infections (URIs) are caused by a virus. A URI affects the nose, throat, and upper air passages. The most common type of URI is often called "the common cold." Follow these instructions at home:  Take medicines only as told by your doctor.  Gargle warm saltwater or take cough drops to comfort your throat as told by your  doctor.  Use a warm mist humidifier or inhale steam from a shower to increase air moisture. This may make it easier to breathe.  Drink enough fluid to keep your pee (urine) clear or pale yellow.  Eat soups and other clear broths.  Have a healthy diet.  Rest as needed.  Go back to work when your fever is gone or your doctor says it is okay. ? You may need to stay home longer to avoid giving your URI to others. ? You can also wear a face mask and wash your hands often to prevent spread of the virus.  Use your inhaler more if you have asthma.  Do not use any tobacco products, including cigarettes, chewing tobacco, or electronic cigarettes. If you need help quitting, ask your doctor. Contact a doctor if:  You are getting worse, not better.  Your symptoms are not helped by medicine.  You have chills.  You are getting more short of breath.  You have brown or red mucus.  You have yellow or brown discharge from your nose.  You have pain in your face, especially when you bend forward.  You have a fever.  You have puffy (swollen) neck glands.  You have pain while swallowing.  You have white areas in the back of your throat. Get help right away if:  You have very bad or constant: ? Headache. ? Ear pain. ? Pain in your forehead, behind your eyes, and over your cheekbones (sinus pain). ? Chest pain.  You have long-lasting (chronic) lung disease and any of the following: ? Wheezing. ? Long-lasting cough. ? Coughing up blood. ? A change in your usual mucus.  You have a stiff neck.  You have changes in your: ? Vision. ? Hearing. ? Thinking. ? Mood. This information is not intended to replace advice given to you by your health care provider. Make sure you discuss any questions you have with your health care provider. Document Released: 03/01/2008 Document Revised: 05/16/2016 Document Reviewed: 12/19/2013 Elsevier Interactive Patient Education  2018 [employer].

## 2017-04-07 NOTE — Progress Notes (Addendum)
Adriahna Shearman  MRN: [id number] DOB: October 31, 1985  Subjective:  Adriana Dawson is a 31 y.o. female seen in office today for a chief complaint sore throat, productive cough, and looser stools x 1 week. Notes the stools are not diarrhea but they are just looser than normal. Has had some weight gain. Notes she has been eating a lot more during the night time before bed.  Denies ear pain, sinus pressure, wheezing, SOB, hemoptysis, nausea, vomiting, abdominal pain, weight loss, night sweats, fever, chills, diaphoresis, mucopurulent stools, or hematochezia. . Has tried aleve and cough syrup with no relief. LMP 04/05/17. On 02/22/17, Quantiferon test resulted positive. CXR was negative. She was referred to [facility]. She had our [facility] appointment at the health department today. She was given Rx for orange capsule medication x 4 months (she cannot recall the name of it), which she has not started.  Denies smoking. Denies new food exposures. No hx of asthma or pneumonia.  No recent travel. Of note, pt is a vegan. Drinks about 1L of water a day.   Review of Systems  Constitutional: Negative for activity change, appetite change and fatigue.  HENT: Positive for congestion and sinus pressure.   Cardiovascular: Negative for chest pain and palpitations.  Gastrointestinal: Positive for constipation ( has always had trouble going to the bathroom, never had regular bowel movements).  Genitourinary: Negative for dysuria, frequency, hematuria and urgency.  Musculoskeletal: Negative for myalgias.  Allergic/Immunologic: Negative for environmental allergies and immunocompromised state.  Psychiatric/Behavioral: Positive for sleep disturbance (due to the cough).    Patient Active Problem List   Diagnosis Date Noted  . MDD (major depressive disorder), single episode, severe , no psychosis ([place]) 06/19/2015    Current Outpatient Prescriptions on File Prior to Visit  Medication Sig Dispense Refill  . Ascorbic Acid (VITAMIN C) 100  MG tablet Take 100 mg by mouth daily.    . Biotin 10 MG CAPS Take by mouth.    . Ferrous Fumarate (FERRIMIN 150 PO) Take by mouth.    Marland Kitchen PARoxetine (PAXIL) 30 MG tablet Take 30 mg by mouth daily.    . benzonatate (TESSALON) 200 MG capsule Take 1 capsule (200 mg total) by mouth 2 (two) times daily as needed for cough. (Patient not taking: Reported on 04/07/2017) 20 capsule 0  . HYDROcodone-homatropine (HYCODAN) 5-1.5 MG/5ML syrup Take 5 mLs by mouth every 6 (six) hours as needed for cough.     No current facility-administered medications on file prior to visit.     Allergies  Allergen Reactions  . Other Other (See Comments)    6 years ago patient was given a medication for the flu(possibly tamiflu) and it made her face fell funny.   Social History   Social History  . Marital status: Married    Spouse name: N/A  . Number of children: N/A  . Years of education: N/A   Occupational History  . Not on file.   Social History Main Topics  . Smoking status: Never Smoker  . Smokeless tobacco: Never Used  . Alcohol use 0.6 - 1.2 oz/week    1 - 2 Glasses of wine per week     Comment: few drinks per week  . Drug use: No  . Sexual activity: Yes    Birth control/ protection: Condom   Other Topics Concern  . Not on file   Social History Narrative  . No narrative on file      Objective:  BP 118/79   Pulse  70   Temp 98.2 F (36.8 C) (Oral)   Resp 16   Ht 5' 2.5" (1.588 m)   Wt 140 lb 3.2 oz (63.6 kg)   SpO2 100%   BMI 25.23 kg/m   Physical Exam  Constitutional: She is oriented to person, place, and time and well-developed, well-nourished, and in no distress.  HENT:  Head: Normocephalic and atraumatic.  Right Ear: Tympanic membrane, external ear and ear canal normal.  Left Ear: Tympanic membrane, external ear and ear canal normal.  Nose: Nose normal. Right sinus exhibits no maxillary sinus tenderness and no frontal sinus tenderness. Left sinus exhibits no maxillary sinus  tenderness and no frontal sinus tenderness.  Mouth/Throat: Uvula is midline and mucous membranes are normal. Posterior oropharyngeal erythema present.  Eyes: Conjunctivae are normal.  Neck: Normal range of motion.  Cardiovascular: Normal rate, regular rhythm and normal heart sounds.   Pulmonary/Chest: Effort normal and breath sounds normal. She has no wheezes. She has no rhonchi. She has no rales.  Abdominal: Soft. Normal appearance and bowel sounds are normal. She exhibits no distension. There is tenderness (mild generalized discomfort noted). There is no rigidity, no guarding, no tenderness at McBurney's point and negative Murphy's sign.  Lymphadenopathy:       Head (right side): No submental, no submandibular, no tonsillar, no preauricular, no posterior auricular and no occipital adenopathy present.       Head (left side): No submental, no submandibular, no tonsillar, no preauricular, no posterior auricular and no occipital adenopathy present.    She has no cervical adenopathy.       Right: No supraclavicular adenopathy present.       Left: No supraclavicular adenopathy present.  Neurological: She is alert and oriented to person, place, and time. Gait normal.  Skin: Skin is warm and dry.  Psychiatric: Affect normal.  Vitals reviewed.    Wt Readings from Last 3 Encounters:  04/07/17 140 lb 3.2 oz (63.6 kg)  03/01/17 131 lb 3.2 oz (59.5 kg)  02/22/17 133 lb 9.6 oz (60.6 kg)   Results for orders placed or performed in visit on 04/07/17 (from the past 24 hour(s))  POCT rapid strep A     Status: None   Collection Time: 04/07/17  1:17 PM  Result Value Ref Range   Rapid Strep A Screen Negative Negative  POCT urinalysis dipstick     Status: Abnormal   Collection Time: 04/07/17  1:28 PM  Result Value Ref Range   Color, UA yellow yellow   Clarity, UA clear clear   Glucose, UA negative negative mg/dL   Bilirubin, UA negative negative   Ketones, POC UA negative negative mg/dL   Spec  Grav, UA 1.025 1.010 - 1.025   Blood, UA large (A) negative   pH, UA 6.0 5.0 - 8.0   Protein Ur, POC negative negative mg/dL   Urobilinogen, UA 0.2 0.2 or 1.0 E.U./dL   Nitrite, UA Negative Negative   Leukocytes, UA Negative Negative  POCT CBC     Status: None   Collection Time: 04/07/17  1:29 PM  Result Value Ref Range   WBC 6.7 4.6 - 10.2 K/uL   Lymph, poc 2.4 0.6 - 3.4   POC LYMPH PERCENT 35.5 10 - 50 %L   MID (cbc) 0.4 0 - 0.9   POC MID % 6.4 0 - 12 %M   POC Granulocyte 3.9 2 - 6.9   Granulocyte percent 58.1 37 - 80 %G   RBC 4.46 4.04 -  5.48 M/uL   Hemoglobin 13.4 12.2 - 16.2 g/dL   HCT, POC 39.5 37.7 - 47.9 %   MCV 88.4 80 - 97 fL   MCH, POC 29.9 27 - 31.2 pg   MCHC 33.9 31.8 - 35.4 g/dL   RDW, POC 12.4 %   Platelet Count, POC 220 142 - 424 K/uL   MPV 7.9 0 - 99.8 fL   Dg Chest 2 View  Result Date: 04/07/2017 CLINICAL DATA:  Cough EXAM: CHEST  2 VIEW COMPARISON:  03/01/2017 FINDINGS: Normal heart size, mediastinal contours, and pulmonary vascularity. Lungs clear. No pleural effusion or pneumothorax. Bones unremarkable. IMPRESSION: Normal exam. Electronically Signed   By: Lavonia Dana M.D.   On: 04/07/2017 13:30   Dg Abd 1 View  Result Date: 04/07/2017 CLINICAL DATA:  History of constipation, recent diarrhea EXAM: ABDOMEN - 1 VIEW COMPARISON:  None FINDINGS: Normal bowel gas pattern. No bowel dilatation or bowel wall thickening. Small cluster of calcifications projects over the RIGHT upper quadrant, question within the upper pole of RIGHT kidney versus less likely gallbladder. Small pelvic phleboliths. No acute osseous findings. IMPRESSION: Normal bowel gas pattern. Small cluster of calcifications in RIGHT upper quadrant question upper pole RIGHT renal calculi or less likely gallstones. Electronically Signed   By: Lavonia Dana M.D.   On: 04/07/2017 13:33    Assessment and Plan :  1. Cough CXR and POCT labs reassuring. Pt appears stable. This is likely a viral etiology. Will  treat symptomatically. Pt instructed to take latent TB tx as recommended by [facility] today. Encouraged pt to return to clinic if symptoms worsen, do not improve, or as needed - POCT CBC - DG Chest 2 View; Future - HYDROcodone-homatropine (HYCODAN) 5-1.5 MG/5ML syrup; Take 5 mLs by mouth every 8 (eight) hours as needed for cough.  Dispense: 120 mL; Refill: 0 - benzonatate (TESSALON) 100 MG capsule; Take 1-2 capsules (100-200 mg total) by mouth 3 (three) times daily as needed for cough.  Dispense: 40 capsule; Refill: 0  2. Sore throat Culture pending, instructed to use OTC ibuprofen or tylenol prn for pain. - POCT rapid strep A - Culture, Group A Strep  3. Loose stools Labs pending. Could be viral etiology; however,  Hx is suspicious for IBS. Will give Rx for bentyl while awaiting lab results. Pt given strict return/ED precautions.  - POCT urinalysis dipstick - DG Abd 1 View; Future - CMP14+EGFR - dicyclomine (BENTYL) 20 MG tablet; Take 1 tablet (20 mg total) by mouth daily.  Dispense: 20 tablet; Refill: 0 - GI Profile, Stool, PCR  4. Weight gain - TSH  Tenna Delaine, PA-C  [facility] [place] 04/07/2017 4:30 PM

## 2017-04-07 NOTE — Progress Notes (Signed)
Adriana Dawson 

## 2017-04-08 LAB — CMP14+EGFR
A/G RATIO: 1.7 (ref 1.2–2.2)
ALBUMIN: 4.1 g/dL (ref 3.5–5.5)
ALT: 21 IU/L (ref 0–32)
AST: 23 IU/L (ref 0–40)
Alkaline Phosphatase: 63 IU/L (ref 39–117)
BILIRUBIN TOTAL: 0.5 mg/dL (ref 0.0–1.2)
BUN / CREAT RATIO: 14 (ref 9–23)
BUN: 10 mg/dL (ref 6–20)
CALCIUM: 8.6 mg/dL — AB (ref 8.7–10.2)
CHLORIDE: 106 mmol/L (ref 96–106)
CO2: 23 mmol/L (ref 20–29)
Creatinine, Ser: 0.71 mg/dL (ref 0.57–1.00)
GFR, EST AFRICAN AMERICAN: 132 mL/min/{1.73_m2} (ref >59)
GFR, EST NON AFRICAN AMERICAN: 115 mL/min/{1.73_m2} (ref >59)
GLOBULIN, TOTAL: 2.4 g/dL (ref 1.5–4.5)
Glucose: 84 mg/dL (ref 65–99)
POTASSIUM: 5 mmol/L (ref 3.5–5.2)
SODIUM: 141 mmol/L (ref 134–144)
TOTAL PROTEIN: 6.5 g/dL (ref 6.0–8.5)

## 2017-04-08 LAB — TSH: TSH: 1.31 u[IU]/mL (ref 0.450–4.500)

## 2017-04-10 LAB — CULTURE, GROUP A STREP: Strep A Culture: NEGATIVE

## 2017-05-16 ENCOUNTER — Encounter: Payer: Self-pay | Admitting: Obstetrics & Gynecology

## 2017-05-16 ENCOUNTER — Ambulatory Visit (INDEPENDENT_AMBULATORY_CARE_PROVIDER_SITE_OTHER): Payer: BLUE CROSS/BLUE SHIELD | Admitting: Obstetrics & Gynecology

## 2017-05-16 VITALS — BP 100/72 | HR 77 | Resp 16 | Ht 62.5 in | Wt 139.0 lb

## 2017-05-16 DIAGNOSIS — N9089 Other specified noninflammatory disorders of vulva and perineum: Secondary | ICD-10-CM | POA: Diagnosis not present

## 2017-05-16 DIAGNOSIS — Z1151 Encounter for screening for human papillomavirus (HPV): Secondary | ICD-10-CM

## 2017-05-16 DIAGNOSIS — Z113 Encounter for screening for infections with a predominantly sexual mode of transmission: Secondary | ICD-10-CM | POA: Diagnosis not present

## 2017-05-16 DIAGNOSIS — Z01419 Encounter for gynecological examination (general) (routine) without abnormal findings: Secondary | ICD-10-CM | POA: Diagnosis not present

## 2017-05-16 DIAGNOSIS — Z124 Encounter for screening for malignant neoplasm of cervix: Secondary | ICD-10-CM | POA: Diagnosis not present

## 2017-05-16 MED ORDER — METRONIDAZOLE 500 MG PO TABS: 500.0000 mg | ORAL_TABLET | Freq: Two times a day (BID) | ORAL | 0 refills | Status: DC

## 2017-05-16 NOTE — Addendum Note (Signed)
Addended by: Allie Bossier on: 05/16/2017 02:42 PM   Modules accepted: Orders

## 2017-05-16 NOTE — Addendum Note (Signed)
Addended by: Granville Lewis on: 05/16/2017 02:44 PM   Modules accepted: Orders
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
## 2017-05-17 LAB — CBC
HCT: 42 % (ref 35.0–45.0)
HEMOGLOBIN: 14.4 g/dL (ref 11.7–15.5)
MCH: 30 pg (ref 27.0–33.0)
MCHC: 34.3 g/dL (ref 32.0–36.0)
MCV: 87.5 fL (ref 80.0–100.0)
MPV: 10.8 fL (ref 7.5–12.5)
Platelets: 209 10*3/uL (ref 140–400)
RBC: 4.8 MIL/uL (ref 3.80–5.10)
RDW: 12.6 % (ref 11.0–15.0)
WBC: 6.3 10*3/uL (ref 3.8–10.8)

## 2017-05-17 LAB — HEPATITIS B SURFACE ANTIGEN
HEP B S AG: NONREACTIVE
HEP B S AG: NONREACTIVE

## 2017-05-17 LAB — HIV ANTIBODY (ROUTINE TESTING W REFLEX)
HIV 1&2 Ab, 4th Generation: NONREACTIVE
HIV: NONREACTIVE

## 2017-05-17 LAB — HEPATITIS C ANTIBODY
HCV Ab: NONREACTIVE
HCV Ab: NONREACTIVE

## 2017-05-17 LAB — RPR

## 2017-05-17 LAB — HSV 2 ANTIBODY, IGG

## 2017-05-20 LAB — CYTOLOGY - PAP
BACTERIAL VAGINITIS: NEGATIVE
CHLAMYDIA, DNA PROBE: NEGATIVE
Candida vaginitis: NEGATIVE
Diagnosis: NEGATIVE
HPV: NOT DETECTED
Neisseria Gonorrhea: NEGATIVE
TRICH (WINDOWPATH): NEGATIVE

## 2017-06-07 ENCOUNTER — Ambulatory Visit (INDEPENDENT_AMBULATORY_CARE_PROVIDER_SITE_OTHER): Payer: BLUE CROSS/BLUE SHIELD | Admitting: Physician Assistant

## 2017-06-07 ENCOUNTER — Encounter: Payer: Self-pay | Admitting: Physician Assistant

## 2017-06-07 VITALS — BP 117/72 | HR 79 | Temp 98.1°F | Resp 18 | Ht 62.5 in | Wt 141.6 lb

## 2017-06-07 DIAGNOSIS — F322 Major depressive disorder, single episode, severe without psychotic features: Secondary | ICD-10-CM

## 2017-06-07 DIAGNOSIS — Z9289 Personal history of other medical treatment: Secondary | ICD-10-CM

## 2017-06-07 DIAGNOSIS — Z23 Encounter for immunization: Secondary | ICD-10-CM | POA: Diagnosis not present

## 2017-06-07 HISTORY — DX: Personal history of other medical treatment: Z92.89

## 2017-06-07 MED ORDER — PAROXETINE HCL 30 MG PO TABS: 30.0000 mg | ORAL_TABLET | Freq: Every day | ORAL | 3 refills | Status: DC

## 2017-06-07 MED ORDER — TRAZODONE HCL 50 MG PO TABS: 25.0000 mg | ORAL_TABLET | Freq: Every evening | ORAL | 1 refills | Status: DC | PRN
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
## 2017-06-07 NOTE — Patient Instructions (Signed)
     IF you received an x-ray today, you will receive an invoice from [facility]. Please contact [facility] at [phone] with questions or concerns regarding your invoice.   IF you received labwork today, you will receive an invoice from [facility]. Please contact [facility] at [phone] with questions or concerns regarding your invoice.   Our billing staff will not be able to assist you with questions regarding bills from these companies.  You will be contacted with the lab results as soon as they are available. The fastest way to get your results is to activate your My Chart account. Instructions are located on the last page of this paperwork. If you have not heard from us regarding the results in 2 weeks, please contact this office.     

## 2017-06-09 ENCOUNTER — Encounter: Payer: Self-pay | Admitting: Physician Assistant

## 2017-06-09 NOTE — Assessment & Plan Note (Signed)
Continue paroxetine, as it is helping her mood and she is tolerating it well. Add trazodone at HS. Would consider bupropion, to help with the inattentiveness, but reluctant due to her feeling hyped up and difficulty sleeping.

## 2017-06-21 ENCOUNTER — Encounter: Payer: Self-pay | Admitting: Obstetrics & Gynecology

## 2017-07-13 ENCOUNTER — Ambulatory Visit (INDEPENDENT_AMBULATORY_CARE_PROVIDER_SITE_OTHER): Payer: BLUE CROSS/BLUE SHIELD | Admitting: Physician Assistant

## 2017-07-13 ENCOUNTER — Encounter: Payer: Self-pay | Admitting: Physician Assistant

## 2017-07-13 VITALS — BP 100/80 | HR 82 | Temp 97.8°F | Resp 18 | Ht 62.5 in | Wt 142.0 lb

## 2017-07-13 DIAGNOSIS — F322 Major depressive disorder, single episode, severe without psychotic features: Secondary | ICD-10-CM | POA: Diagnosis not present

## 2017-07-13 MED ORDER — TRAZODONE HCL 50 MG PO TABS: 25.0000 mg | ORAL_TABLET | Freq: Every evening | ORAL | 1 refills | Status: DC | PRN

## 2017-07-13 MED ORDER — BUPROPION HCL ER (XL) 150 MG PO TB24: 150.0000 mg | ORAL_TABLET | Freq: Every day | ORAL | 1 refills | Status: DC

## 2017-07-13 NOTE — Assessment & Plan Note (Signed)
Some improvement but not adequately controlled. Continue trazodone and paroxetine, and ADD bupropion.

## 2017-07-13 NOTE — Patient Instructions (Addendum)
Continue the paroxetine (Paxil) and the trazodone as before. ADD the bupropion (Wellbutrin), take it in the MORNING.    IF you received an x-ray today, you will receive an invoice from [facility]. Please contact [facility] at [phone] with questions or concerns regarding your invoice.   IF you received labwork today, you will receive an invoice from [facility]. Please contact [facility] at [phone] with questions or concerns regarding your invoice.   Our billing staff will not be able to assist you with questions regarding bills from these companies.  You will be contacted with the lab results as soon as they are available. The fastest way to get your results is to activate your My Chart account. Instructions are located on the last page of this paperwork. If you have not heard from us regarding the results in 2 weeks, please contact this office.

## 2017-07-13 NOTE — Progress Notes (Signed)
Patient ID: Adriana Dawson, female    DOB: 09-03-1986, 31 y.o.   MRN: [id number]  PCP: Patient, No Pcp Per  Chief Complaint  Patient presents with  . Medication Refill    Trazodone HCI 50 MG, Pt states the medication has been doing okay.    Subjective:   Presents for evaluation of depression.  She says that she is doing ok, but actually is continuing to struggle. She saw her therapist, Mariane Masters, MA, LPA most recently on 07/06/2017, and I received the notes for review today. The patient confirms that she has little energy and motivation, is engaging in emotional eating behaviors, has difficulty with focus and concentration and has stopped going to class. Unfortunately, she has not formally withdrawn from classes yet.  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 In addition to her husband, her parents, grandmother, brother and sister-in-law live locally. Her parents had been care giving for her grandmother, who is 25, until her father's stroke. As her mother doesn't drive, all their errands and medical appointments require her assistance. Her brother's drug addiction has relapsed and her sister-in-law, only in the [place] for a year, needs help with adjustment, directions, etc.  Her sleep is improved, as has communication with her husband, though he continues to give her advice rather than just listen and empathize when she is sharing how she feels and what she is struggling with. Nightmares persist, but are better. Continues to tolerate the paroxetine and trazodone, and thinks they are working, just not adequately.  She reports fleeting thoughts of self-harm, without intention or plan.    Review of Systems As above.  Depression screen [facility] 2/9 07/13/2017 06/07/2017 04/07/2017 03/01/2017 02/22/2017  Decreased Interest 0 0 0 0 0  Down, Depressed, Hopeless 0 0 0 0 0  PHQ - 2 Score 0 0 0 0 0  Altered sleeping - - - - -  Tired, decreased energy - - - - -    Change in appetite - - - - -  Feeling bad or failure about yourself  - - - - -  Trouble concentrating - - - - -  Moving slowly or fidgety/restless - - - - -  Suicidal thoughts - - - - -  PHQ-9 Score - - - - -     Patient Active Problem List   Diagnosis Date Noted  . Positive PPD 06/07/2017  . MDD (major depressive disorder), single episode, severe , no psychosis (HCC) 06/19/2015     Prior to Admission medications   Medication Sig Start Date End Date Taking? Authorizing Provider  Ascorbic Acid (VITAMIN C) 100 MG tablet Take 100 mg by mouth daily.   Yes [provider]  Biotin 10 MG CAPS Take by mouth.   Yes [provider]  PARoxetine (PAXIL) 30 MG tablet Take 1 tablet (30 mg total) by mouth daily. 06/07/17  Yes Mischa Pollard, PA-C  rifampin (RIFADIN) 300 MG capsule Take by mouth.   Yes [provider]  traZODone (DESYREL) 50 MG tablet Take 0.5-2 tablets (25-100 mg total) by mouth at bedtime as needed for sleep. 06/07/17  Yes Porfirio Oar, PA-C     Allergies  Allergen Reactions  . Other Other (See Comments)    6 years ago patient was given a medication for the flu(possibly tamiflu) and it made her face fell funny.       Objective:  Physical Exam  Constitutional: She is  oriented to person, place, and time. She appears well-developed and well-nourished. She is active and cooperative. No distress.  BP 100/80 (BP Location: Left Arm, Patient Position: Sitting, Cuff Size: Normal)   Pulse 82   Temp 97.8 F (36.6 C) (Oral)   Resp 18   Ht 5' 2.5" (1.588 m)   Wt 142 lb (64.4 kg)   LMP 07/10/2017   SpO2 98%   BMI 25.56 kg/m   HENT:  Head: Normocephalic and atraumatic.  Right Ear: Hearing normal.  Left Ear: Hearing normal.  Eyes: Conjunctivae are normal. No scleral icterus.  Neck: Normal range of motion. Neck supple. No thyromegaly present.  Cardiovascular: Normal rate, regular rhythm and normal heart sounds.   Pulses:      Radial pulses are  2+ on the right side, and 2+ on the left side.  Pulmonary/Chest: Effort normal and breath sounds normal.  Lymphadenopathy:       Head (right side): No tonsillar, no preauricular, no posterior auricular and no occipital adenopathy present.       Head (left side): No tonsillar, no preauricular, no posterior auricular and no occipital adenopathy present.    She has no cervical adenopathy.       Right: No supraclavicular adenopathy present.       Left: No supraclavicular adenopathy present.  Neurological: She is alert and oriented to person, place, and time. No sensory deficit.  Skin: Skin is warm, dry and intact. No rash noted. No cyanosis or erythema. Nails show no clubbing.  Psychiatric: She has a normal mood and affect. Her speech is normal and behavior is normal.       Assessment & Plan:   Problem List Items Addressed This Visit    MDD (major depressive disorder), single episode, severe , no psychosis (HCC) - Primary    Some improvement but not adequately controlled. Continue trazodone and paroxetine, and ADD bupropion.      Relevant Medications   traZODone (DESYREL) 50 MG tablet   buPROPion (WELLBUTRIN XL) 150 MG 24 hr tablet       Return in about 4 weeks (around 08/10/2017) for re-evaluation of mood.   Fernande Brashelle S. Zuzanna Maroney, PA-C Primary Care at [facility]

## 2017-07-19 ENCOUNTER — Ambulatory Visit: Payer: Self-pay | Admitting: Obstetrics & Gynecology

## 2017-07-19 DIAGNOSIS — Z09 Encounter for follow-up examination after completed treatment for conditions other than malignant neoplasm: Secondary | ICD-10-CM

## 2017-07-29 IMAGING — DX DG CHEST 2V
2 series · 2 of 2 positions shown · non-contrast
Comparison: 05/28/2015

CLINICAL DATA: Cough, 7 days duration. Mildly productive of sputum.

EXAM:
CHEST  2 VIEW

[chest pa]
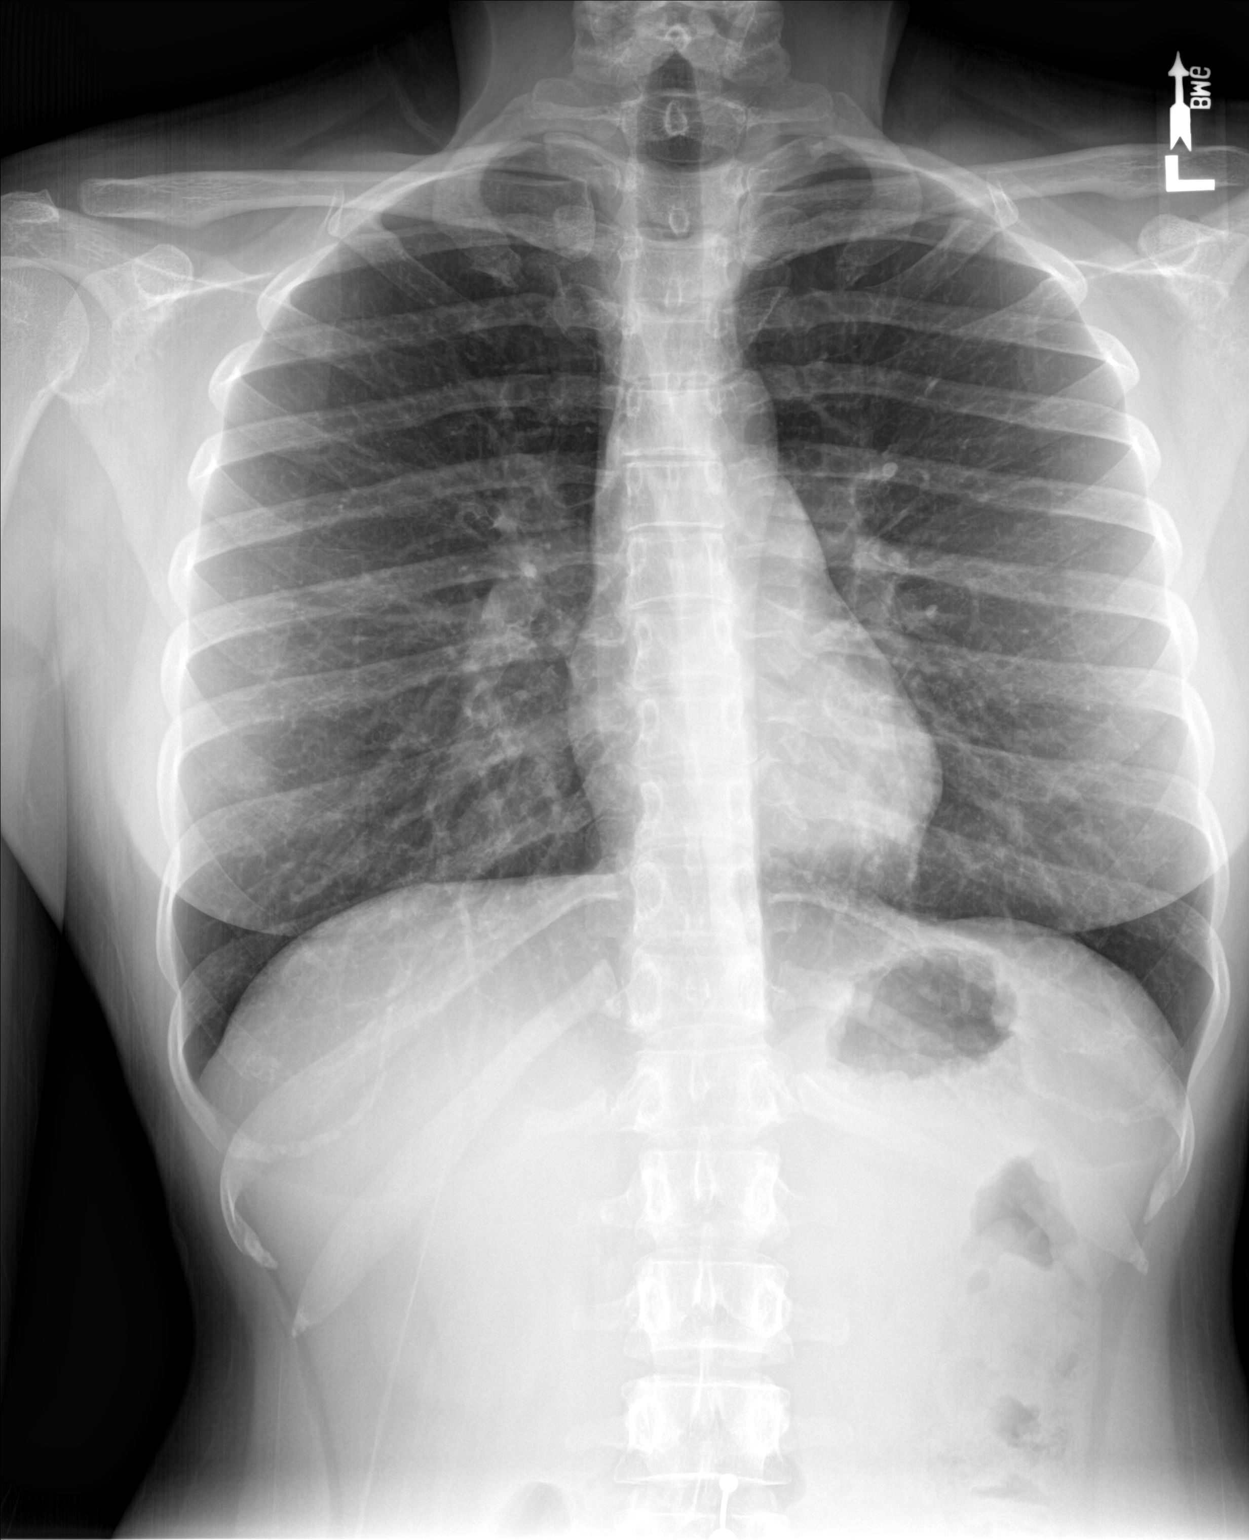

[chest lat]
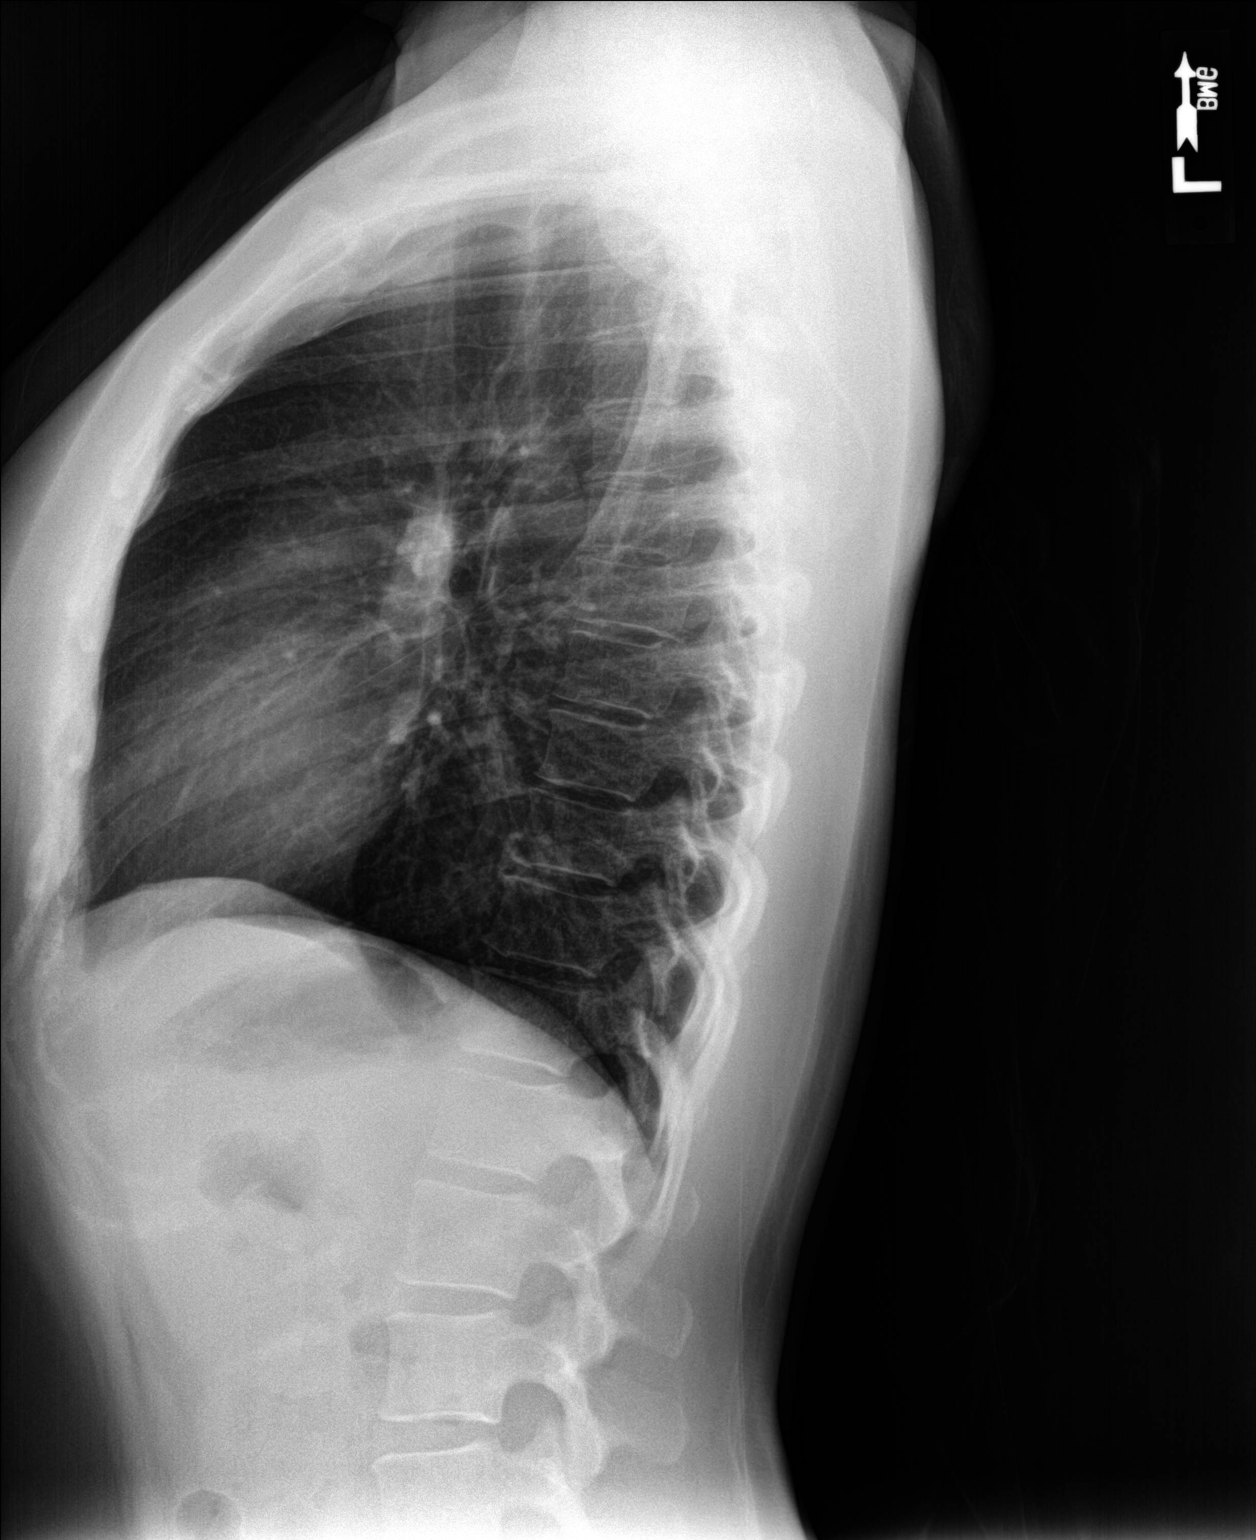

[2 of 2 positions shown; findings below may reference images not displayed]

FINDINGS: Heart size is normal. Mediastinal shadows are normal. The lungs are
clear. No bronchial thickening. No infiltrate, mass, effusion or
collapse. Pulmonary vascularity is normal. No bony abnormality.
IMPRESSION: Normal chest

## 2017-08-05 IMAGING — DX DG CHEST 2V
2 series · 2 of 2 positions shown · non-contrast
Comparison: 02/22/2017

CLINICAL DATA: Cough, positive QuantiFERON test

EXAM:
CHEST  2 VIEW

[chest pa]
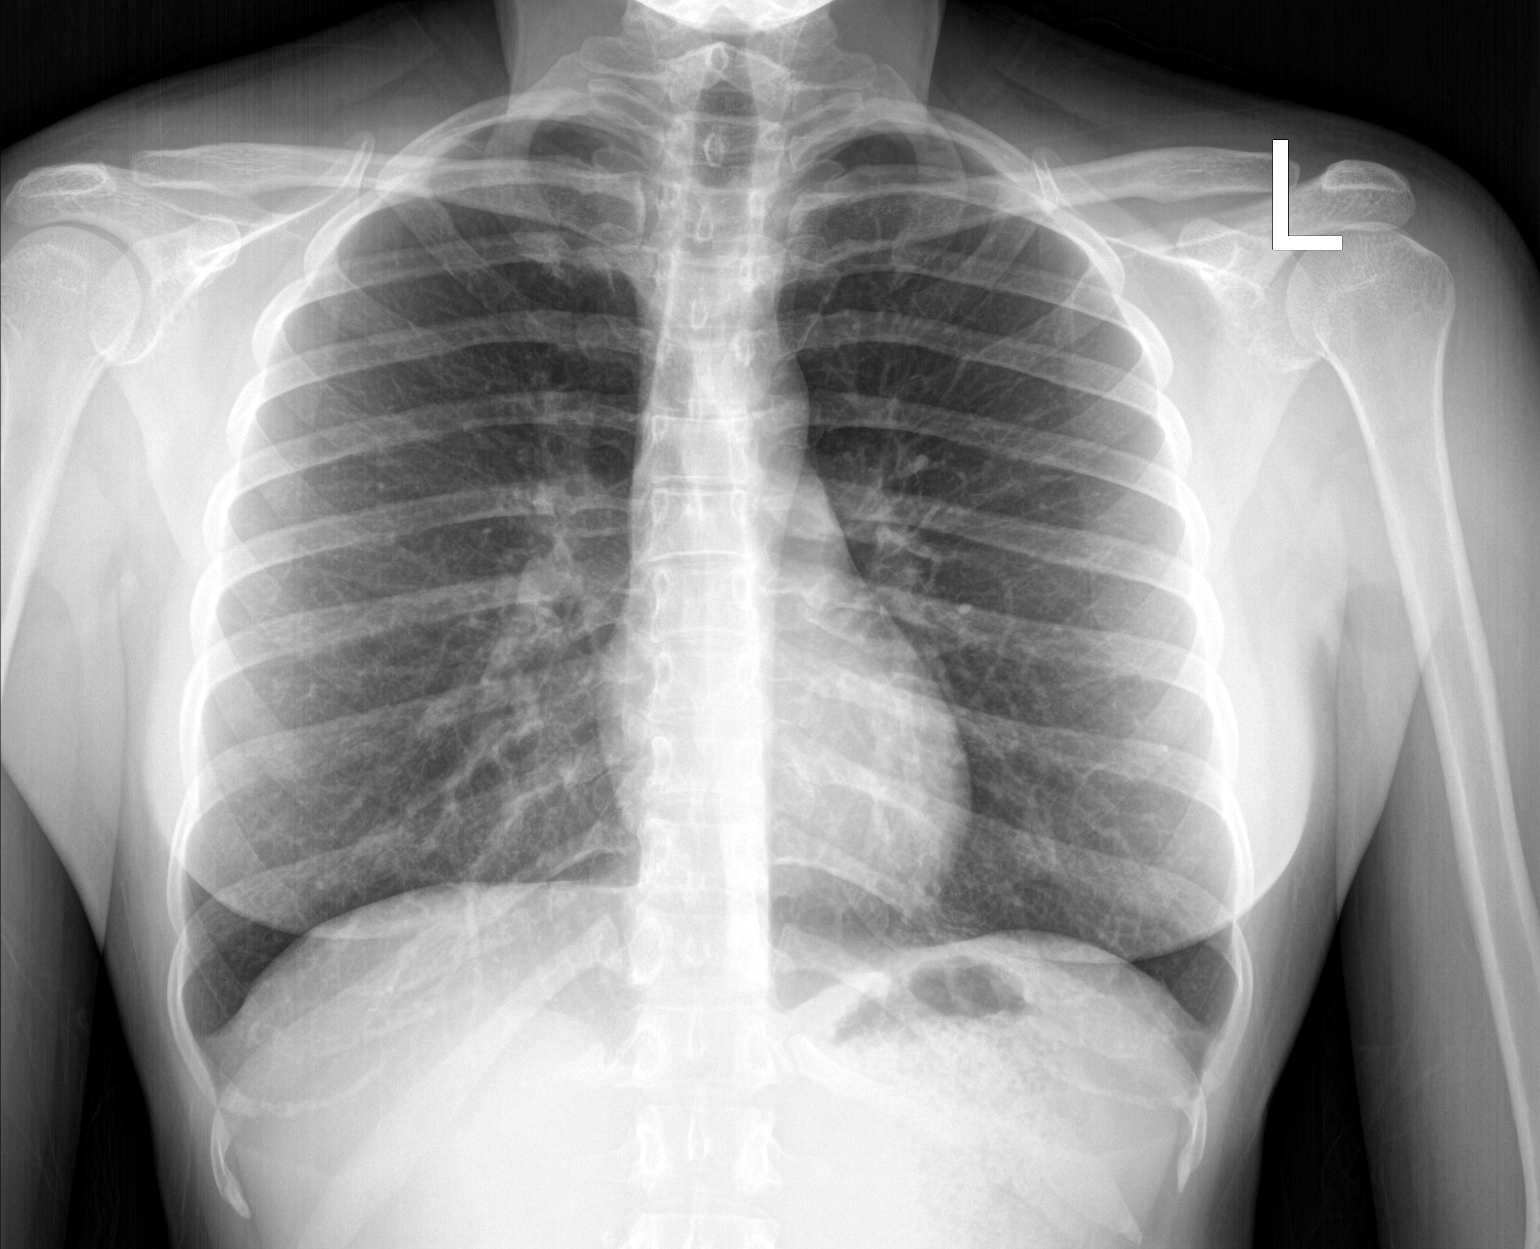

[chest lat]
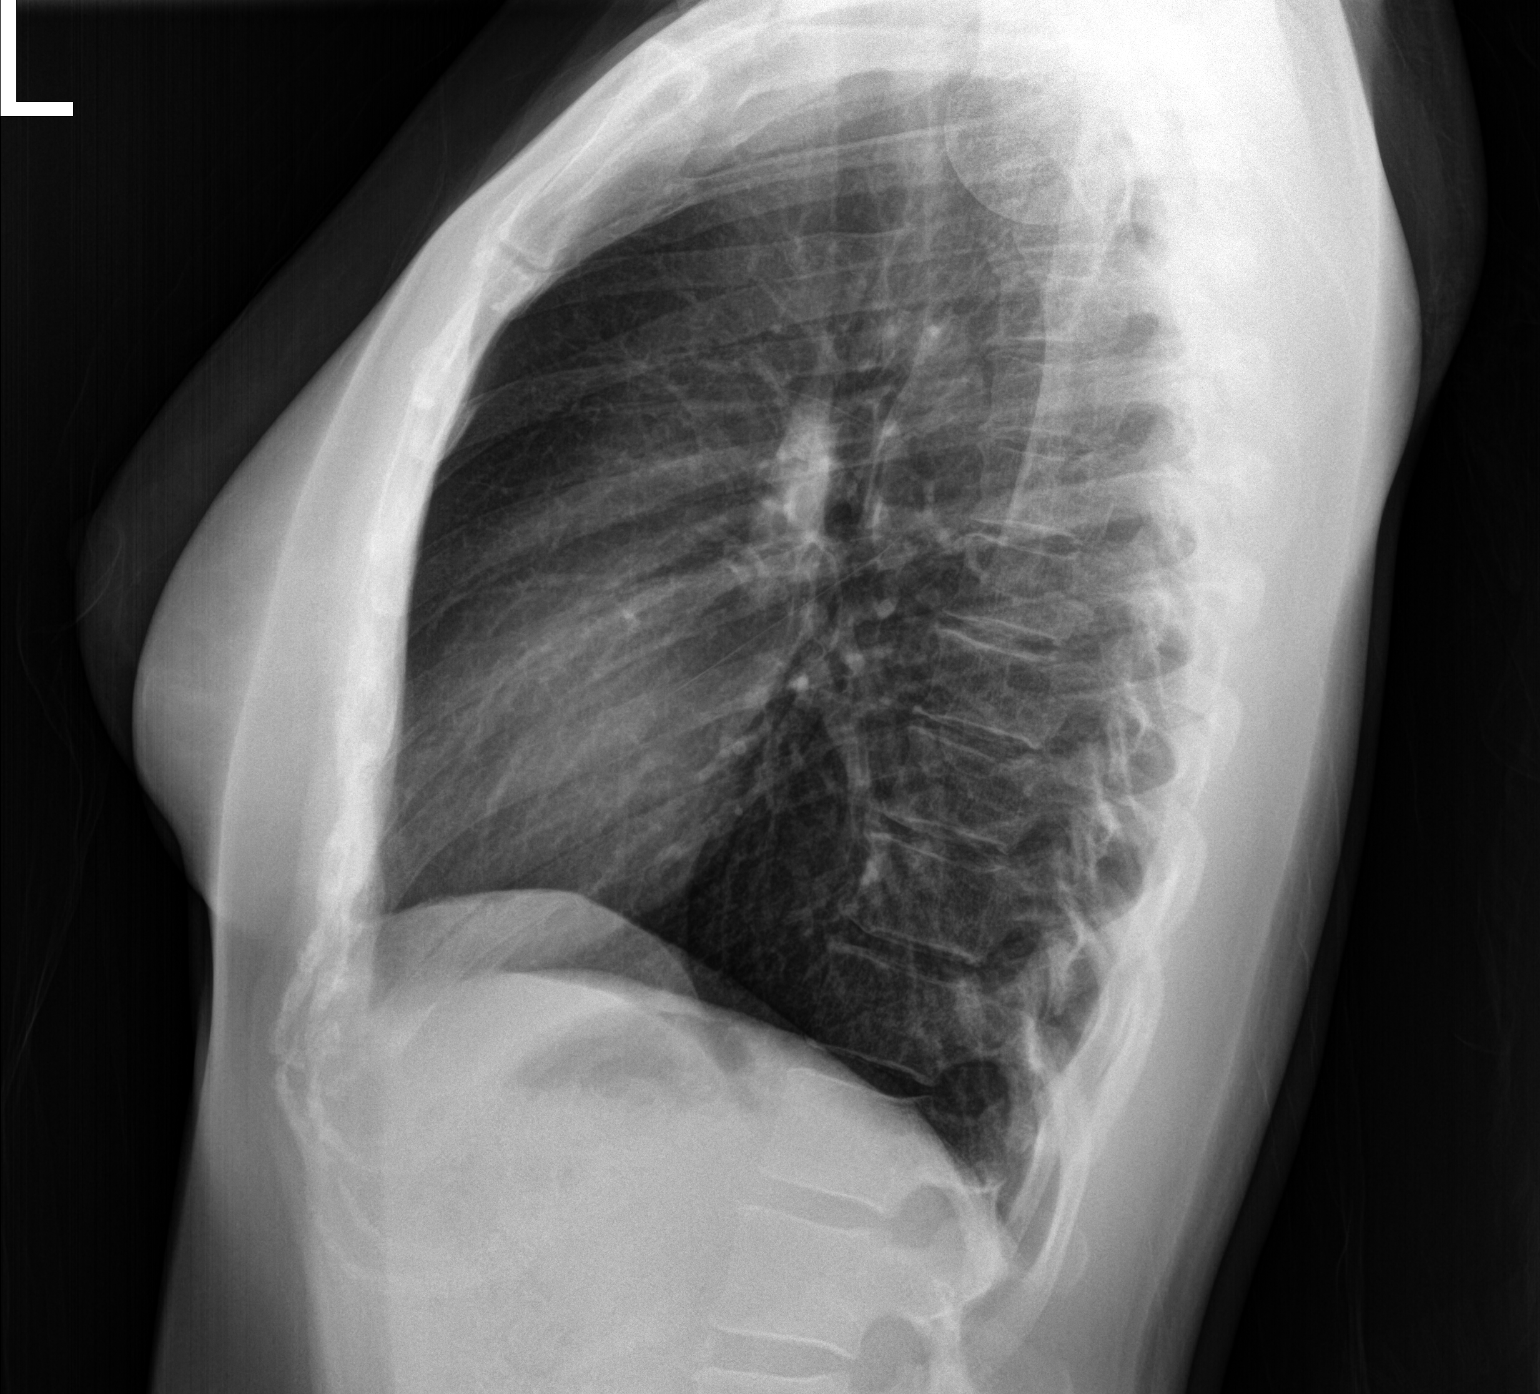

[2 of 2 positions shown; findings below may reference images not displayed]

FINDINGS: Normal heart size, mediastinal contours, and pulmonary vascularity.

Lungs hyperinflated but clear.

No pleural effusion or pneumothorax.

Bones unremarkable.
IMPRESSION: Hyperinflated lungs without infiltrate.

## 2017-08-09 ENCOUNTER — Ambulatory Visit: Payer: BLUE CROSS/BLUE SHIELD | Admitting: Physician Assistant

## 2017-09-11 IMAGING — DX DG CHEST 2V
2 series · 2 of 2 positions shown · non-contrast
Comparison: 03/01/2017

CLINICAL DATA: Cough

EXAM:
CHEST  2 VIEW

[chest pa]
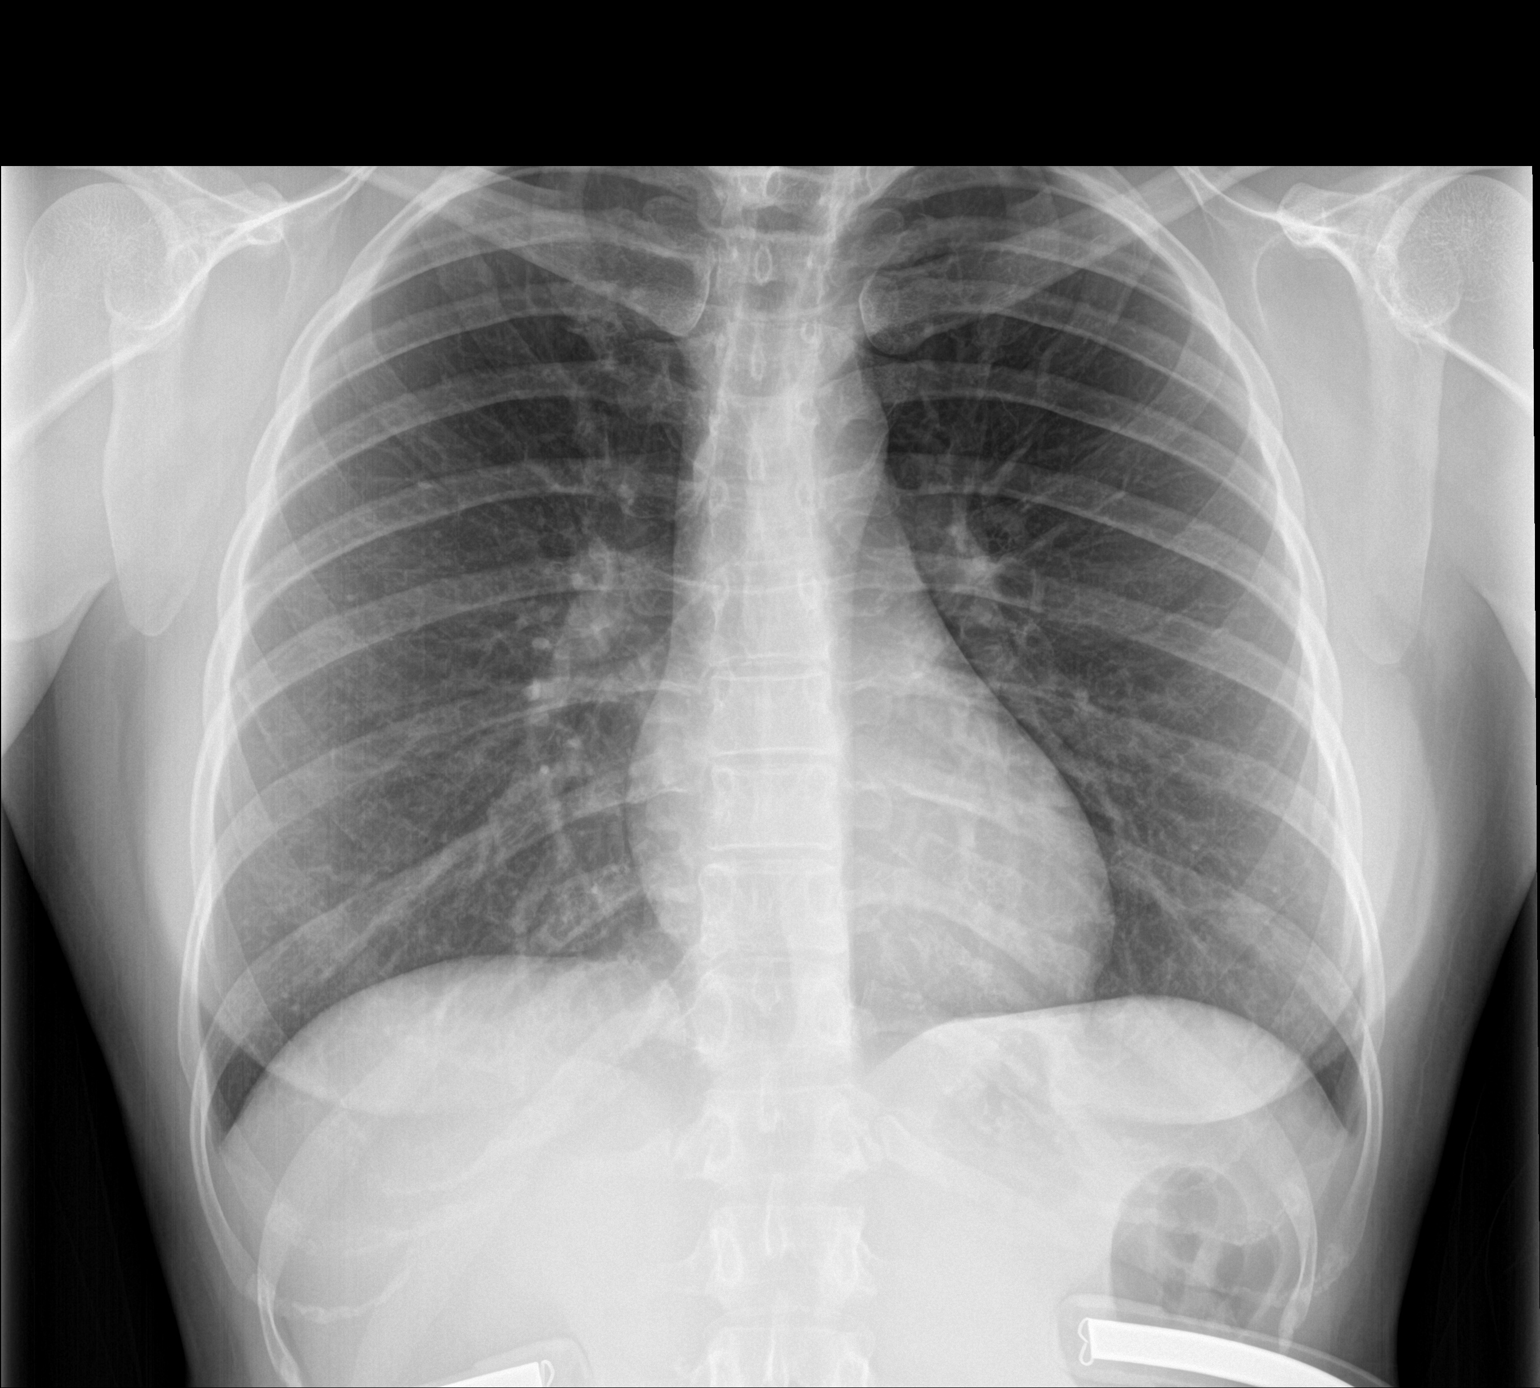

[chest lat]
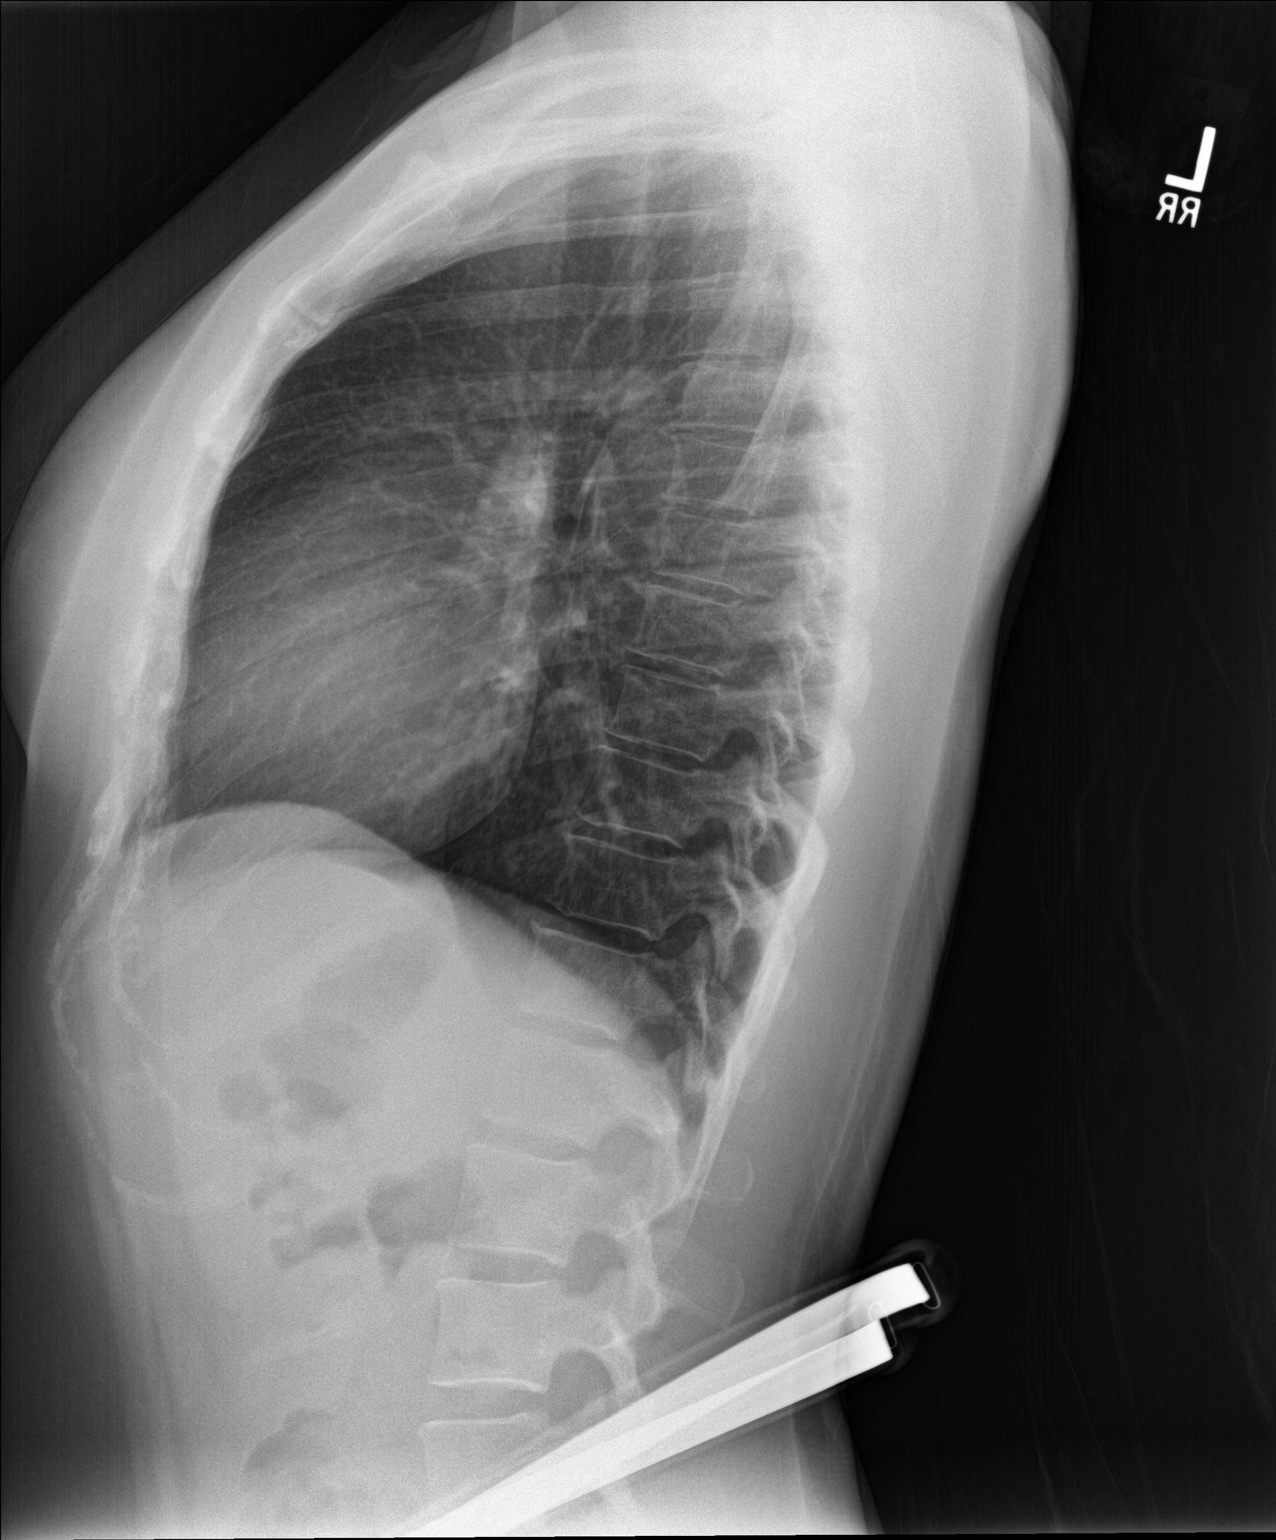

[2 of 2 positions shown; findings below may reference images not displayed]

FINDINGS: Normal heart size, mediastinal contours, and pulmonary vascularity.

Lungs clear.

No pleural effusion or pneumothorax.

Bones unremarkable.
IMPRESSION: Normal exam.

## 2017-09-11 IMAGING — DX DG ABDOMEN 1V
2 series · 2 of 2 positions shown · non-contrast
Comparison: None

CLINICAL DATA: History of constipation, recent diarrhea

EXAM:
ABDOMEN - 1 VIEW

[abdomen kub (1 of 2)]
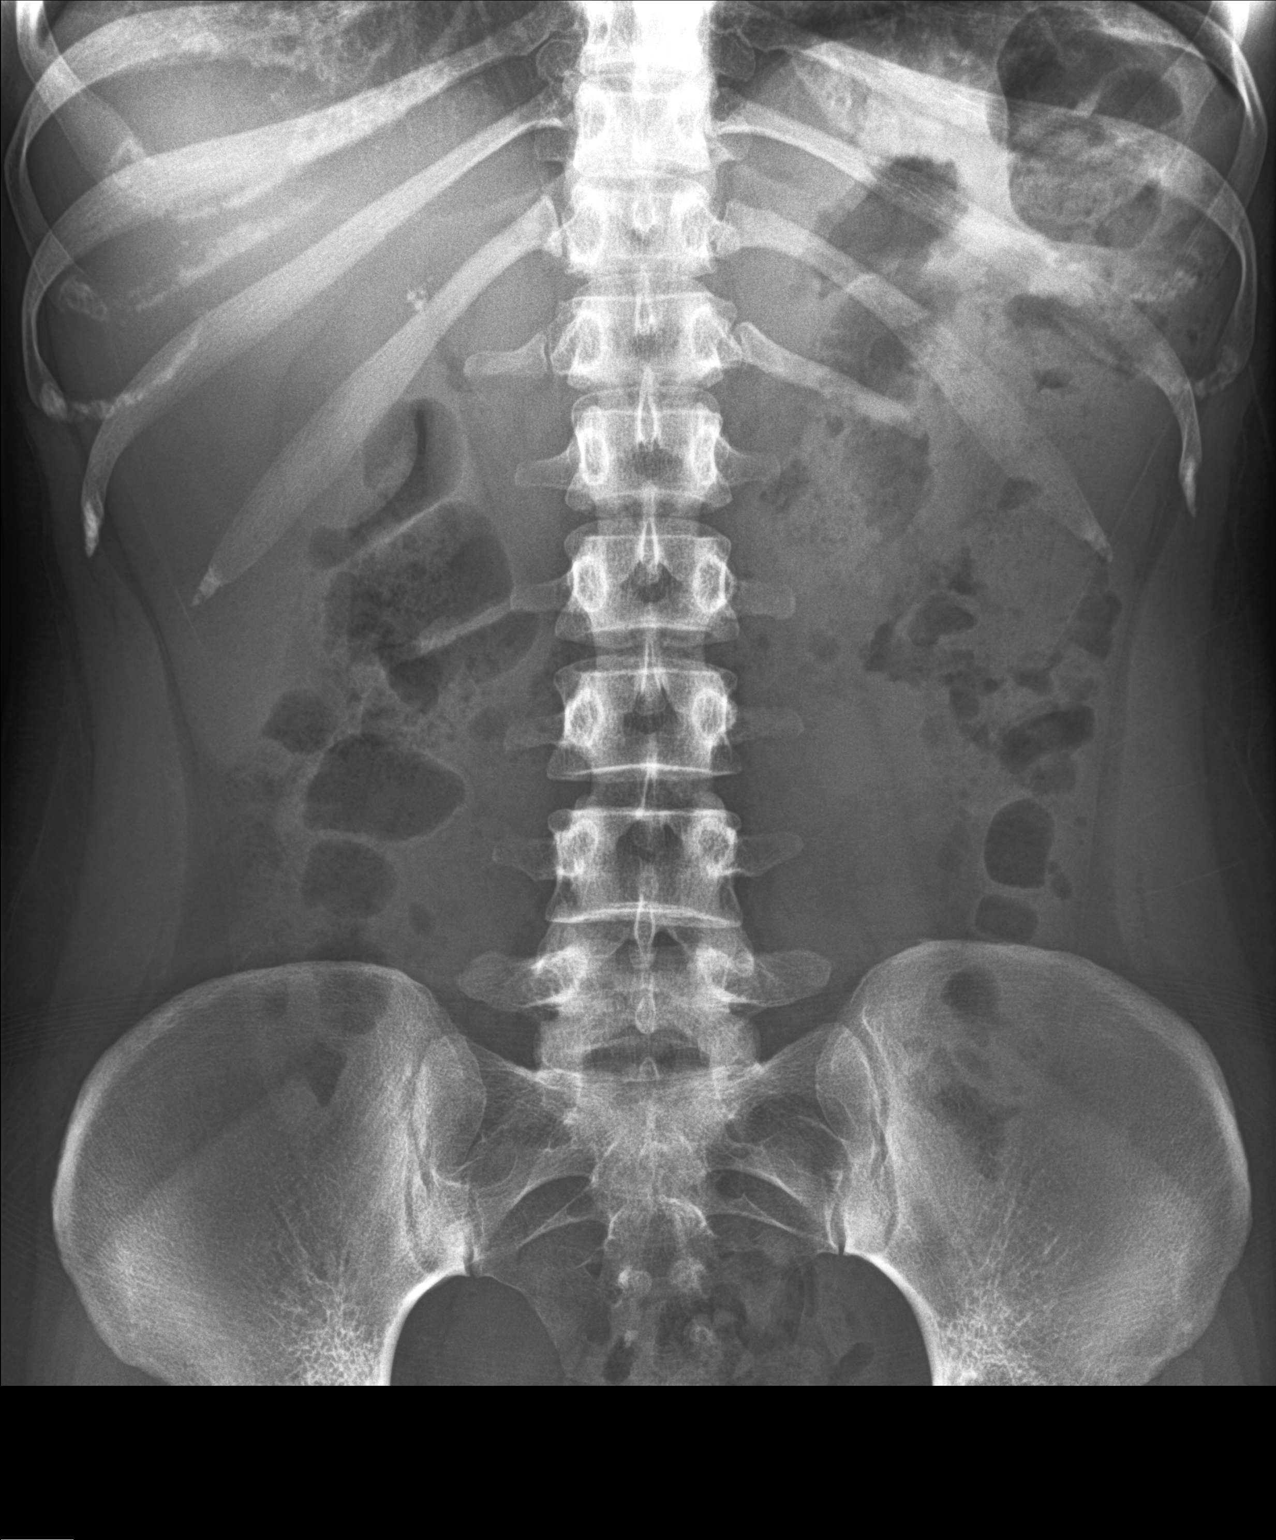

[abdomen kub (2 of 2)]
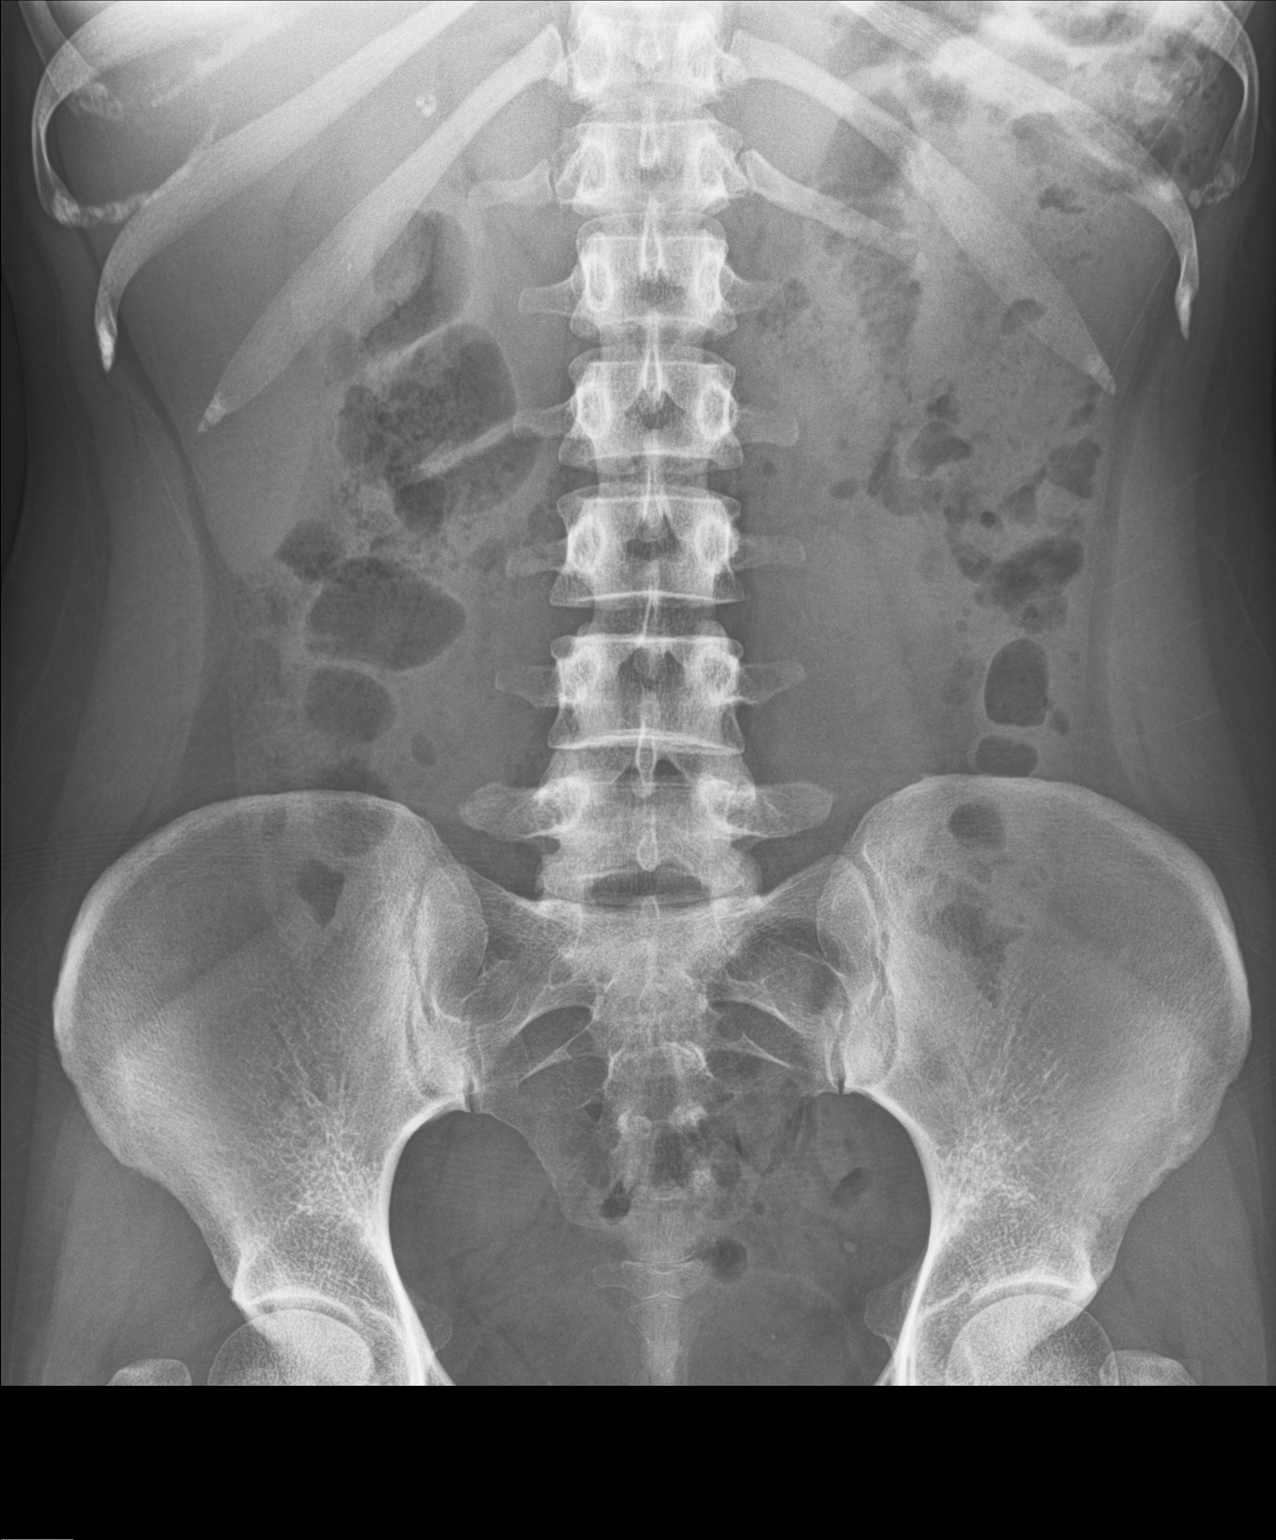

[2 of 2 positions shown; findings below may reference images not displayed]

FINDINGS: Normal bowel gas pattern.

No bowel dilatation or bowel wall thickening.

Small cluster of calcifications projects over the RIGHT upper
quadrant, question within the upper pole of RIGHT kidney versus less
likely gallbladder.

Small pelvic phleboliths.

No acute osseous findings.
IMPRESSION: Normal bowel gas pattern.

Small cluster of calcifications in RIGHT upper quadrant question
upper pole RIGHT renal calculi or less likely gallstones.

## 2017-10-20 ENCOUNTER — Telehealth: Payer: Self-pay | Admitting: Physician Assistant

## 2017-10-20 NOTE — Telephone Encounter (Signed)
Patient had [employer] fax in FMLA Forms for Chelle to complete. Pt has not been seen since 06/2017 sent fax back to [employer] explaining pt needs OV before we can complete forms.

## 2017-11-02 ENCOUNTER — Other Ambulatory Visit: Payer: Self-pay

## 2017-11-02 ENCOUNTER — Ambulatory Visit: Payer: BLUE CROSS/BLUE SHIELD | Admitting: Physician Assistant

## 2017-11-02 ENCOUNTER — Encounter: Payer: Self-pay | Admitting: Physician Assistant

## 2017-11-02 VITALS — BP 102/60 | HR 101 | Temp 98.5°F | Resp 18 | Ht 62.5 in | Wt 149.4 lb

## 2017-11-02 DIAGNOSIS — N930 Postcoital and contact bleeding: Secondary | ICD-10-CM

## 2017-11-02 DIAGNOSIS — M25531 Pain in right wrist: Secondary | ICD-10-CM | POA: Diagnosis not present

## 2017-11-02 DIAGNOSIS — N926 Irregular menstruation, unspecified: Secondary | ICD-10-CM | POA: Diagnosis not present
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
## 2017-11-02 NOTE — Patient Instructions (Addendum)
Wear your splint as much as you can! Including at work and at bedtime. Only take off to bathe. Take 600-800 mg of Ibuprofen 3 times a daily with meals for 2 weeks.  If it is has not improved in 2 weeks, let us know and we will refer you to Orthopedics.    IF you received an x-ray today, you will receive an invoice from [facility]. Please contact [facility] at [phone] with questions or concerns regarding your invoice.   IF you received labwork today, you will receive an invoice from [facility]. Please contact [facility] at [phone] with questions or concerns regarding your invoice.   Our billing staff will not be able to assist you with questions regarding bills from these companies.  You will be contacted with the lab results as soon as they are available. The fastest way to get your results is to activate your My Chart account. Instructions are located on the last page of this paperwork. If you have not heard from us regarding the results in 2 weeks, please contact this office.

## 2017-11-15 ENCOUNTER — Encounter: Payer: Self-pay | Admitting: Obstetrics and Gynecology

## 2017-11-17 ENCOUNTER — Telehealth: Payer: Self-pay | Admitting: Physician Assistant

## 2017-11-17 NOTE — Telephone Encounter (Signed)
Patient had [employer] fax over FMLA forms for her. I am assuming it is for her wrist injury but I am not sure. I have sent a mychart message to the patient asking her. But I was not sure how to complete the forms because I did not see any restrictions or any time off due to this injury. I will wait and see if the patient reply's to the Mychart message before giving them to Chelle to complete.

## 2017-11-18 ENCOUNTER — Ambulatory Visit: Payer: BLUE CROSS/BLUE SHIELD | Admitting: Physician Assistant

## 2017-11-18 ENCOUNTER — Ambulatory Visit (INDEPENDENT_AMBULATORY_CARE_PROVIDER_SITE_OTHER): Payer: BLUE CROSS/BLUE SHIELD

## 2017-11-18 ENCOUNTER — Other Ambulatory Visit: Payer: Self-pay

## 2017-11-18 ENCOUNTER — Encounter: Payer: Self-pay | Admitting: Physician Assistant

## 2017-11-18 VITALS — BP 110/78 | HR 88 | Temp 98.6°F | Resp 18 | Ht 62.5 in | Wt 150.6 lb

## 2017-11-18 DIAGNOSIS — M25531 Pain in right wrist: Secondary | ICD-10-CM

## 2017-11-18 DIAGNOSIS — S60211A Contusion of right wrist, initial encounter: Secondary | ICD-10-CM | POA: Diagnosis not present

## 2017-11-18 DIAGNOSIS — F322 Major depressive disorder, single episode, severe without psychotic features: Secondary | ICD-10-CM | POA: Diagnosis not present

## 2017-11-18 MED ORDER — PAROXETINE HCL 20 MG PO TABS: 30.0000 mg | ORAL_TABLET | Freq: Every day | ORAL | 1 refills | Status: DC

## 2017-11-18 NOTE — Patient Instructions (Addendum)
Restart the paroxetine by taking 10 mg daily for 3-4 days, then 20 mg for 1-2 weeks. You can increase the dose back to up to 30 mg if you want, or continue at 20 mg.  Call to schedule with Myriam JacobsonHelen as soon as you can.  Take 600 mg of ibuprofen, three times a day, with food. Continue the wrist splint. Minimize the use of the wrist.   IF you received an x-ray today, you will receive an invoice from [facility]. Please contact [facility] at [phone] with questions or concerns regarding your invoice.   IF you received labwork today, you will receive an invoice from [facility]. Please contact [facility] at [phone] with questions or concerns regarding your invoice.   Our billing staff will not be able to assist you with questions regarding bills from these companies.  You will be contacted with the lab results as soon as they are available. The fastest way to get your results is to activate your My Chart account. Instructions are located on the last page of this paperwork. If you have not heard from us regarding the results in 2 weeks, please contact this office.
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
## 2017-11-20 NOTE — Assessment & Plan Note (Signed)
Resume medical treatment with paroxetine. Counseled regarding potential adverse effects, including weight gain, though she gained weight after stopping treatment previously.

## 2017-11-21 ENCOUNTER — Telehealth: Payer: Self-pay | Admitting: Physician Assistant

## 2017-11-21 NOTE — Telephone Encounter (Signed)
Results sent to patient in MyChart.

## 2017-11-21 NOTE — Telephone Encounter (Signed)
Copied from CRM [phone]#[id number]. Topic: Quick Communication - See Telephone Encounter >> Nov 21, 2017 10:16 AM Maia Pettiesrtiz, Kristie S wrote: CRM for notification. See Telephone encounter for: 11/21/17.  Pt called in requesting call back with results of xray of her hand. Pt also states her employer sent in FMLA paperwork last week and it needs to be returned ASAP as there is only a 14 day window to determine approval/denial on her FMLA. Pt states Theora Gianottihelle Jeffrey, [place] is aware of the primary reason for FMLA and is requesting she include documentation of limited use of her R hand due to her recent injury. 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 She has had to leave work and incur points due to the issue. Pt is requesting a call back for this also.

## 2017-11-21 NOTE — Telephone Encounter (Signed)
FMLA forms will be placed in Chelle's box on 11/21/17 to be completed. Once I get them back I will fax them to [employer] for the patient.  Please return to the FMLA/Disability desk within 5-7 business days thank you

## 2017-11-24 NOTE — Telephone Encounter (Signed)
I have received the FMLA forms and started them, based on the two visits for her RIGHT hand pain. However, since the recommendation was for reduced use of the RIGHT hand/arm, not time away from work, I am not sure that this is the correct form.  Please clarify with the patient.

## 2017-11-24 NOTE — Telephone Encounter (Signed)
Sending pt a mychart message about her fmla forms

## 2017-11-28 NOTE — Telephone Encounter (Signed)
Patient emailed me back about her FMLA forms and stated " The FMLA forms are for personal time off if needed for doctor appointments if I am unable to get time off for my appointments or therapy appointments if needed. My therapy appointments are needed Due to depression and PTSD and also her appointments for follow ups.     Thank you   Toryn    --I will place the forms back in Chelle's box on 11/28/17 if you need to see the patient for an apt to complete let me know and I will give her a call.   Thank you

## 2017-11-29 NOTE — Telephone Encounter (Signed)
Paperwork scanned and faxed to [employer] on 11/29/17 °

## 2017-11-29 NOTE — Telephone Encounter (Signed)
Form completed and returned to FMLA/disability desk 

## 2017-12-14 DIAGNOSIS — F4311 Post-traumatic stress disorder, acute: Secondary | ICD-10-CM | POA: Diagnosis not present

## 2017-12-21 ENCOUNTER — Encounter: Payer: Self-pay | Admitting: Obstetrics and Gynecology

## 2017-12-23 ENCOUNTER — Ambulatory Visit: Payer: BLUE CROSS/BLUE SHIELD | Admitting: Physician Assistant

## 2017-12-26 DIAGNOSIS — F4311 Post-traumatic stress disorder, acute: Secondary | ICD-10-CM | POA: Diagnosis not present

## 2017-12-29 ENCOUNTER — Encounter: Payer: Self-pay | Admitting: Physician Assistant

## 2017-12-29 DIAGNOSIS — Z3169 Encounter for other general counseling and advice on procreation: Secondary | ICD-10-CM | POA: Diagnosis not present

## 2017-12-29 DIAGNOSIS — N970 Female infertility associated with anovulation: Secondary | ICD-10-CM | POA: Diagnosis not present

## 2017-12-29 DIAGNOSIS — Z01419 Encounter for gynecological examination (general) (routine) without abnormal findings: Secondary | ICD-10-CM | POA: Diagnosis not present

## 2017-12-29 DIAGNOSIS — N915 Oligomenorrhea, unspecified: Secondary | ICD-10-CM | POA: Diagnosis not present

## 2017-12-29 DIAGNOSIS — Z113 Encounter for screening for infections with a predominantly sexual mode of transmission: Secondary | ICD-10-CM | POA: Diagnosis not present

## 2017-12-29 DIAGNOSIS — Z0000 Encounter for general adult medical examination without abnormal findings: Secondary | ICD-10-CM | POA: Diagnosis not present

## 2017-12-29 DIAGNOSIS — Z6826 Body mass index (BMI) 26.0-26.9, adult: Secondary | ICD-10-CM | POA: Diagnosis not present

## 2017-12-29 DIAGNOSIS — Z13220 Encounter for screening for lipoid disorders: Secondary | ICD-10-CM | POA: Diagnosis not present

## 2017-12-29 DIAGNOSIS — N898 Other specified noninflammatory disorders of vagina: Secondary | ICD-10-CM | POA: Diagnosis not present

## 2017-12-29 DIAGNOSIS — Z130 Encounter for screening for diseases of the blood and blood-forming organs and certain disorders involving the immune mechanism: Secondary | ICD-10-CM | POA: Diagnosis not present

## 2017-12-29 DIAGNOSIS — Z118 Encounter for screening for other infectious and parasitic diseases: Secondary | ICD-10-CM | POA: Diagnosis not present

## 2017-12-29 DIAGNOSIS — Z3200 Encounter for pregnancy test, result unknown: Secondary | ICD-10-CM | POA: Diagnosis not present

## 2018-01-04 ENCOUNTER — Encounter: Payer: Self-pay | Admitting: Physician Assistant

## 2018-01-05 DIAGNOSIS — F4311 Post-traumatic stress disorder, acute: Secondary | ICD-10-CM | POA: Diagnosis not present

## 2018-01-17 DIAGNOSIS — N970 Female infertility associated with anovulation: Secondary | ICD-10-CM | POA: Diagnosis not present

## 2018-01-25 DIAGNOSIS — F4311 Post-traumatic stress disorder, acute: Secondary | ICD-10-CM | POA: Diagnosis not present

## 2018-01-26 DIAGNOSIS — N970 Female infertility associated with anovulation: Secondary | ICD-10-CM | POA: Diagnosis not present

## 2018-02-03 ENCOUNTER — Encounter (HOSPITAL_COMMUNITY): Payer: Self-pay | Admitting: Emergency Medicine

## 2018-02-03 ENCOUNTER — Ambulatory Visit (INDEPENDENT_AMBULATORY_CARE_PROVIDER_SITE_OTHER): Payer: BLUE CROSS/BLUE SHIELD

## 2018-02-03 ENCOUNTER — Ambulatory Visit: Payer: BLUE CROSS/BLUE SHIELD | Admitting: Physician Assistant

## 2018-02-03 ENCOUNTER — Ambulatory Visit (HOSPITAL_COMMUNITY)
Admission: EM | Admit: 2018-02-03 | Discharge: 2018-02-03 | Disposition: A | Discharge status: Home / Self Care | Payer: BLUE CROSS/BLUE SHIELD | Attending: Family Medicine | Admitting: Family Medicine

## 2018-02-03 DIAGNOSIS — R142 Eructation: Secondary | ICD-10-CM | POA: Diagnosis not present

## 2018-02-03 DIAGNOSIS — Z3202 Encounter for pregnancy test, result negative: Secondary | ICD-10-CM

## 2018-02-03 LAB — POCT PREGNANCY, URINE: Preg Test, Ur: NEGATIVE

## 2018-02-03 MED ORDER — KETOROLAC TROMETHAMINE 60 MG/2ML IM SOLN: 60.0000 mg | Freq: Once | INTRAMUSCULAR | Status: DC

## 2018-02-03 MED ORDER — BACLOFEN 5 MG PO TABS: 5.0000 mg | ORAL_TABLET | Freq: Three times a day (TID) | ORAL | 0 refills | Status: DC

## 2018-02-03 MED ORDER — METOCLOPRAMIDE HCL 5 MG/ML IJ SOLN
INTRAMUSCULAR | Status: AC
  Filled 2018-02-03: qty 2

## 2018-02-03 MED ORDER — METOCLOPRAMIDE HCL 5 MG/ML IJ SOLN: 5.0000 mg | Freq: Once | INTRAMUSCULAR | Status: DC

## 2018-02-03 MED ORDER — DEXAMETHASONE SODIUM PHOSPHATE 10 MG/ML IJ SOLN: 10.0000 mg | Freq: Once | INTRAMUSCULAR | Status: DC

## 2018-02-03 MED ORDER — KETOROLAC TROMETHAMINE 60 MG/2ML IM SOLN
INTRAMUSCULAR | Status: AC
  Filled 2018-02-03: qty 2

## 2018-02-03 MED ORDER — SUMATRIPTAN SUCCINATE 6 MG/0.5ML ~~LOC~~ SOLN: 6.0000 mg | Freq: Once | SUBCUTANEOUS | Status: DC

## 2018-02-03 MED ORDER — DEXAMETHASONE SODIUM PHOSPHATE 10 MG/ML IJ SOLN
INTRAMUSCULAR | Status: AC
  Filled 2018-02-03: qty 1

## 2018-02-03 MED ORDER — SUMATRIPTAN SUCCINATE 6 MG/0.5ML ~~LOC~~ SOLN
SUBCUTANEOUS | Status: AC
  Filled 2018-02-03: qty 0.5
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
## 2018-02-03 NOTE — ED Triage Notes (Signed)
Pt c/o burping non stop x4 days, has tried zantac and gas x without relief. Denies any pain.

## 2018-02-03 NOTE — Discharge Instructions (Addendum)
Consider increasing the paroxetine to 40 mg a day if stress is a factor Add simethicone and a probiotic 2 x a day Avoid gas forming foods and carbonated beverages Try the baclofen only if other measures do not work Caution drowsiness at first Ask husband to observe if belching occurs at night Follow up with PCP

## 2018-02-06 ENCOUNTER — Telehealth (HOSPITAL_COMMUNITY): Payer: Self-pay | Admitting: Family Medicine

## 2018-02-06 NOTE — Telephone Encounter (Signed)
Patient called with ongoing belching that was distressing to her.  Referred to gastroenterology

## 2018-02-08 ENCOUNTER — Other Ambulatory Visit: Payer: Self-pay | Admitting: Gastroenterology

## 2018-02-08 DIAGNOSIS — R142 Eructation: Secondary | ICD-10-CM | POA: Diagnosis not present

## 2018-02-14 ENCOUNTER — Encounter: Payer: Self-pay | Admitting: Physician Assistant

## 2018-02-14 ENCOUNTER — Ambulatory Visit
Admission: RE | Admit: 2018-02-14 | Discharge: 2018-02-14 | Disposition: A | Discharge status: Home / Self Care | Payer: BLUE CROSS/BLUE SHIELD | Source: Ambulatory Visit | Attending: Gastroenterology | Admitting: Gastroenterology

## 2018-02-14 DIAGNOSIS — R142 Eructation: Secondary | ICD-10-CM | POA: Insufficient documentation

## 2018-02-14 DIAGNOSIS — F4311 Post-traumatic stress disorder, acute: Secondary | ICD-10-CM | POA: Diagnosis not present

## 2018-02-14 DIAGNOSIS — K219 Gastro-esophageal reflux disease without esophagitis: Secondary | ICD-10-CM | POA: Diagnosis not present

## 2018-02-22 ENCOUNTER — Other Ambulatory Visit: Payer: Self-pay | Admitting: Gastroenterology

## 2018-02-22 DIAGNOSIS — R142 Eructation: Secondary | ICD-10-CM

## 2018-03-03 ENCOUNTER — Ambulatory Visit
Admission: RE | Admit: 2018-03-03 | Discharge: 2018-03-03 | Disposition: A | Discharge status: Home / Self Care | Payer: BLUE CROSS/BLUE SHIELD | Source: Ambulatory Visit | Attending: Gastroenterology | Admitting: Gastroenterology

## 2018-03-03 DIAGNOSIS — R142 Eructation: Secondary | ICD-10-CM | POA: Diagnosis not present

## 2018-03-06 DIAGNOSIS — F4311 Post-traumatic stress disorder, acute: Secondary | ICD-10-CM | POA: Diagnosis not present

## 2018-03-13 DIAGNOSIS — K760 Fatty (change of) liver, not elsewhere classified: Secondary | ICD-10-CM | POA: Diagnosis not present

## 2018-03-13 DIAGNOSIS — K219 Gastro-esophageal reflux disease without esophagitis: Secondary | ICD-10-CM | POA: Diagnosis not present

## 2018-03-13 DIAGNOSIS — R142 Eructation: Secondary | ICD-10-CM | POA: Diagnosis not present

## 2018-03-20 ENCOUNTER — Encounter (HOSPITAL_COMMUNITY): Payer: Self-pay | Admitting: Emergency Medicine

## 2018-03-20 ENCOUNTER — Emergency Department (HOSPITAL_COMMUNITY)
Admission: EM | Admit: 2018-03-20 | Discharge: 2018-03-21 | Disposition: A | Discharge status: Home / Self Care | Payer: BLUE CROSS/BLUE SHIELD | Attending: Emergency Medicine | Admitting: Emergency Medicine

## 2018-03-20 ENCOUNTER — Other Ambulatory Visit: Payer: Self-pay

## 2018-03-20 DIAGNOSIS — R142 Eructation: Secondary | ICD-10-CM | POA: Diagnosis not present

## 2018-03-20 DIAGNOSIS — Z79899 Other long term (current) drug therapy: Secondary | ICD-10-CM | POA: Insufficient documentation

## 2018-03-20 DIAGNOSIS — N281 Cyst of kidney, acquired: Secondary | ICD-10-CM | POA: Diagnosis not present

## 2018-03-20 DIAGNOSIS — R1013 Epigastric pain: Secondary | ICD-10-CM | POA: Diagnosis not present

## 2018-03-20 MED ORDER — FAMOTIDINE IN NACL 20-0.9 MG/50ML-% IV SOLN
20.0000 mg | Freq: Once | INTRAVENOUS | Status: AC
  Administered 2018-03-20: 20 mg via INTRAVENOUS
  Filled 2018-03-20: qty 50

## 2018-03-20 MED ORDER — GI COCKTAIL ~~LOC~~
30.0000 mL | Freq: Once | ORAL | Status: AC
  Administered 2018-03-20: 30 mL via ORAL
  Filled 2018-03-20: qty 30

## 2018-03-20 NOTE — ED Notes (Signed)
Pt stated she would like to go to a hallway bed

## 2018-03-20 NOTE — ED Notes (Signed)
Dr. Haviland at bedside. 

## 2018-03-20 NOTE — ED Triage Notes (Addendum)
Pt has had GI problem, states she has constant belching with nausea and centralized chest pain, feels like something is stick in her chest. Pt has been taking Baclofen and another med prescribed by urgent care, taking zantac and gas x with no relief. Pt has an endoscopy appointment scheduled for Thursday. She has had an XRAY done. Pt belching in triage.

## 2018-03-20 NOTE — ED Provider Notes (Signed)
Patient placed in Quick Look pathway, seen and evaluated   Chief Complaint:belching  HPI:  Adriana Dawson is a 32 y.o. female here with uncontrolled belching.  She states is been happening for 2 months. It came on suddenly.  Does not seem to related to eating more foods.  No prior GERD or acid reflux.  No known hiatal hernia.  Does not usually have stomach problems.  She does have a diet high in vegetables.  It is not changed.  No new supplements.  She periodically takes a probiotic but has not been using this regularly.  Does not feel like hiccups.  She belches several times a minute interrupting her conversation.  She does not feel able to control this when it happens. Patient reports having difficulty sleeping due to the belching.  No nausea or vomiting.  No change in bowel habits.  Last bowel movement was this morning. She does have a history of depression and anxiety.  She is on Paxil 30 mg a day.  She states she started taking it again a few months back.  She thinks it is working for her.  She denies any stress or anxiety at this time. Patient states she feels like she needs to vomit to try to get something out of her throat. Patient was evaluated at Urgent Care and    ROS: GI: belching  Physical Exam:  BP 120/87 (BP Location: Right Arm)   Pulse 90   Temp 98.1 F (36.7 C) (Oral)   Resp 14   Ht 5\' 3"  (1.6 Dawson)   Wt 67.6 kg (149 lb)   LMP 03/06/2018   SpO2 99%   BMI 26.39 kg/Dawson    Gen: No distress  Neuro: Awake and Alert  Skin: Warm and dry  ENT: no difficulty swallowing   Initiation of care has begun. The patient has been counseled on the process, plan, and necessity for staying for the completion/evaluation, and the remainder of the medical screening examination    Adriana Dawson, Adriana Dawson M, NP 03/20/18 2001    Melene PlanFloyd, Dan, DO 03/20/18 2322
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
## 2018-03-21 ENCOUNTER — Emergency Department (HOSPITAL_COMMUNITY): Payer: BLUE CROSS/BLUE SHIELD

## 2018-03-21 DIAGNOSIS — N281 Cyst of kidney, acquired: Secondary | ICD-10-CM | POA: Diagnosis not present

## 2018-03-21 LAB — COMPREHENSIVE METABOLIC PANEL
ALT: 21 U/L (ref 0–44)
AST: 25 U/L (ref 15–41)
Albumin: 4.2 g/dL (ref 3.5–5.0)
Alkaline Phosphatase: 52 U/L (ref 38–126)
Anion gap: 8 (ref 5–15)
BUN: 5 mg/dL — AB (ref 6–20)
CHLORIDE: 105 mmol/L (ref 98–111)
CO2: 24 mmol/L (ref 22–32)
Calcium: 9 mg/dL (ref 8.9–10.3)
Creatinine, Ser: 0.82 mg/dL (ref 0.44–1.00)
GFR calc Af Amer: >60 mL/min (ref >60)
GLUCOSE: 95 mg/dL (ref 70–99)
POTASSIUM: 3.7 mmol/L (ref 3.5–5.1)
SODIUM: 137 mmol/L (ref 135–145)
Total Bilirubin: 1.1 mg/dL (ref 0.3–1.2)
Total Protein: 7 g/dL (ref 6.5–8.1)

## 2018-03-21 LAB — CBC WITH DIFFERENTIAL/PLATELET
Abs Immature Granulocytes: 0 10*3/uL (ref 0.0–0.1)
Basophils Absolute: 0.1 10*3/uL (ref 0.0–0.1)
Basophils Relative: 1 %
EOS ABS: 0.1 10*3/uL (ref 0.0–0.7)
Eosinophils Relative: 1 %
HCT: 41.2 % (ref 36.0–46.0)
Hemoglobin: 14.1 g/dL (ref 12.0–15.0)
IMMATURE GRANULOCYTES: 0 %
Lymphocytes Relative: 48 %
Lymphs Abs: 3.3 10*3/uL (ref 0.7–4.0)
MCH: 29 pg (ref 26.0–34.0)
MCHC: 34.2 g/dL (ref 30.0–36.0)
MCV: 84.8 fL (ref 78.0–100.0)
MONOS PCT: 6 %
Monocytes Absolute: 0.4 10*3/uL (ref 0.1–1.0)
NEUTROS PCT: 44 %
Neutro Abs: 3.1 10*3/uL (ref 1.7–7.7)
Platelets: ADEQUATE 10*3/uL (ref 150–400)
RBC: 4.86 MIL/uL (ref 3.87–5.11)
RDW: 11.4 % — AB (ref 11.5–15.5)
WBC: 7 10*3/uL (ref 4.0–10.5)

## 2018-03-21 LAB — URINALYSIS, ROUTINE W REFLEX MICROSCOPIC
Bilirubin Urine: NEGATIVE
GLUCOSE, UA: NEGATIVE mg/dL
Hgb urine dipstick: NEGATIVE
Ketones, ur: NEGATIVE mg/dL
NITRITE: NEGATIVE
Protein, ur: NEGATIVE mg/dL
SPECIFIC GRAVITY, URINE: 1.014 (ref 1.005–1.030)
pH: 6 (ref 5.0–8.0)

## 2018-03-21 LAB — POC URINE PREG, ED: Preg Test, Ur: NEGATIVE

## 2018-03-21 LAB — LIPASE, BLOOD: Lipase: 48 U/L (ref 11–51)

## 2018-03-21 MED ORDER — CHLORPROMAZINE HCL 25 MG PO TABS: 25.0000 mg | ORAL_TABLET | Freq: Three times a day (TID) | ORAL | 0 refills | Status: DC | PRN

## 2018-03-21 MED ORDER — IOHEXOL 300 MG/ML  SOLN
100.0000 mL | Freq: Once | INTRAMUSCULAR | Status: AC | PRN
  Administered 2018-03-21: 100 mL via INTRAVENOUS

## 2018-03-21 MED ORDER — CHLORPROMAZINE HCL 25 MG PO TABS
25.0000 mg | ORAL_TABLET | Freq: Once | ORAL | Status: AC
  Administered 2018-03-21: 25 mg via ORAL
  Filled 2018-03-21: qty 1

## 2018-03-21 NOTE — Discharge Instructions (Addendum)
You were seen today for persistent belching.  Your work-up was reassuring.  Follow-up with gastroenterology as scheduled.  If you have improvement with Thorazine, you may take up to 3 times daily.

## 2018-03-21 NOTE — ED Provider Notes (Signed)
Patient signed out pending labs and CT imaging.  Patient given Thorazine persistent belching.  Labs reviewed and largely unremarkable.  CT scan also unremarkable.  Patient reassured.  She did have some intermittent improvement with Thorazine.  Will discharge home with Thorazine 3 times daily as needed.  Follow-up with gastroenterology as scheduled for endoscopy.  After history, exam, and medical workup I feel the patient has been appropriately medically screened and is safe for discharge home. Pertinent diagnoses were discussed with the patient. Patient was given return precautions.    Shon BatonHorton, Elmin Wiederholt F, MD 03/21/18 [phone]

## 2018-03-22 DIAGNOSIS — F4311 Post-traumatic stress disorder, acute: Secondary | ICD-10-CM | POA: Diagnosis not present

## 2018-03-23 DIAGNOSIS — R140 Abdominal distension (gaseous): Secondary | ICD-10-CM | POA: Diagnosis not present

## 2018-03-23 DIAGNOSIS — K219 Gastro-esophageal reflux disease without esophagitis: Secondary | ICD-10-CM | POA: Diagnosis not present

## 2018-03-27 DIAGNOSIS — K219 Gastro-esophageal reflux disease without esophagitis: Secondary | ICD-10-CM | POA: Diagnosis not present

## 2018-03-28 ENCOUNTER — Encounter: Payer: Self-pay | Admitting: Physician Assistant

## 2018-04-05 DIAGNOSIS — F4311 Post-traumatic stress disorder, acute: Secondary | ICD-10-CM | POA: Diagnosis not present

## 2018-04-13 DIAGNOSIS — K5900 Constipation, unspecified: Secondary | ICD-10-CM | POA: Diagnosis not present

## 2018-04-13 DIAGNOSIS — R142 Eructation: Secondary | ICD-10-CM | POA: Diagnosis not present

## 2018-04-13 DIAGNOSIS — R140 Abdominal distension (gaseous): Secondary | ICD-10-CM | POA: Diagnosis not present

## 2018-04-18 ENCOUNTER — Other Ambulatory Visit: Payer: Self-pay

## 2018-04-18 ENCOUNTER — Emergency Department (HOSPITAL_COMMUNITY)
Admission: EM | Admit: 2018-04-18 | Discharge: 2018-04-19 | Disposition: A | Discharge status: Home / Self Care | Payer: BLUE CROSS/BLUE SHIELD | Attending: Emergency Medicine | Admitting: Emergency Medicine

## 2018-04-18 ENCOUNTER — Encounter (HOSPITAL_COMMUNITY): Payer: Self-pay

## 2018-04-18 DIAGNOSIS — K219 Gastro-esophageal reflux disease without esophagitis: Secondary | ICD-10-CM | POA: Insufficient documentation

## 2018-04-18 DIAGNOSIS — Z79899 Other long term (current) drug therapy: Secondary | ICD-10-CM | POA: Insufficient documentation

## 2018-04-18 DIAGNOSIS — R142 Eructation: Secondary | ICD-10-CM

## 2018-04-18 NOTE — ED Triage Notes (Addendum)
Pt c/o acid reflux for months now, has been to 3 GI docs, multiple medications with no relief. Pt continues to belch and have burning feeling in her chest + nausea. Pt reports having endoscopy done with no answers. Pt belching in triage. Pt tearful, as she reports that she cant eat or drink anything without having this issue, interferes with normal daily life and work.

## 2018-04-19 ENCOUNTER — Telehealth: Payer: Self-pay | Admitting: Family Medicine

## 2018-04-19 ENCOUNTER — Encounter: Payer: Self-pay | Admitting: Physician Assistant

## 2018-04-19 ENCOUNTER — Telehealth: Payer: Self-pay | Admitting: *Deleted

## 2018-04-19 ENCOUNTER — Other Ambulatory Visit: Payer: Self-pay

## 2018-04-19 ENCOUNTER — Ambulatory Visit: Payer: BLUE CROSS/BLUE SHIELD | Admitting: Physician Assistant

## 2018-04-19 ENCOUNTER — Ambulatory Visit: Payer: Self-pay | Admitting: *Deleted

## 2018-04-19 VITALS — BP 111/73 | HR 84 | Temp 98.5°F | Resp 16 | Ht 63.0 in | Wt 149.0 lb

## 2018-04-19 DIAGNOSIS — F322 Major depressive disorder, single episode, severe without psychotic features: Secondary | ICD-10-CM | POA: Diagnosis not present

## 2018-04-19 DIAGNOSIS — F411 Generalized anxiety disorder: Secondary | ICD-10-CM | POA: Diagnosis not present

## 2018-04-19 DIAGNOSIS — R142 Eructation: Secondary | ICD-10-CM

## 2018-04-19 MED ORDER — BUSPIRONE HCL 10 MG PO TABS: 10.0000 mg | ORAL_TABLET | ORAL | 2 refills | Status: DC | PRN

## 2018-04-19 MED ORDER — PAROXETINE HCL 20 MG PO TABS: 30.0000 mg | ORAL_TABLET | Freq: Every day | ORAL | 2 refills | Status: DC

## 2018-04-19 NOTE — Patient Instructions (Addendum)
You will receive a phone call to schedule an appointment with therapy.   For anxiety: Buspar: 10 mg/day as needed.   For belching: Management includes education to decrease air swallowing and reassurancethat belching is a benign condition. Specific behavioral measures include discontinuation of gum chewing, smoking, drinking carbonated beverages, and gulping foods and liquids. In patients with underlying depression or anxiety, treatment should be initiated   Come back and see me after your trip  Thank you for coming in today. I hope you feel we met your needs.  Feel free to call PCP if you have any questions or further requests.  Please consider signing up for MyChart if you do not already have it, as this is a great way to communicate with me.  Best,  Whitney McVey, PA-C  IF you received an x-ray today, you will receive an invoice from [facility]. Please contact [facility] at [phone] with questions or concerns regarding your invoice.   IF you received labwork today, you will receive an invoice from [facility]. Please contact [facility] at [phone] with questions or concerns regarding your invoice.   Our billing staff will not be able to assist you with questions regarding bills from these companies.  You will be contacted with the lab results as soon as they are available. The fastest way to get your results is to activate your My Chart account. Instructions are located on the last page of this paperwork. If you have not heard from [place] regarding the results in 2 weeks, please contact this office.

## 2018-04-19 NOTE — Telephone Encounter (Signed)
[facility]regarding directions for patient use.  Buspar take one tablet daily, per [place], [place].

## 2018-04-19 NOTE — Discharge Instructions (Addendum)
Simethicone over-the-counter as needed for gas buildup.  Follow-up with your gastroenterologist tomorrow to arrange a follow-up appointment.
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
## 2018-04-19 NOTE — Telephone Encounter (Signed)
Summary: Heartburn/nausea   Pt is having really bad heartburn, nausea and gas. Pt is also experiencing constipation. Pt has a new pt appt on 7/26 with Benny LennertSarah Weber.  Pt: [phone]

## 2018-04-19 NOTE — Progress Notes (Signed)
Adriana Dawson  MRN: [id number] DOB: 1986-07-21  PCP: Porfirio Oar, PA-C  Subjective:  Pt is a 32 year old female who presents to clinic for non-resolving belching.  This is been a problem for her for about 2 months.  About 2 weeks  April she started taking Fertility medication, letrozole. Belching started after this.  She tried "everything I could over the counter" like gas-x, zantac. Went to urgent care 5/10 - Rx for baclofen. She took this for about 1-2 weeks. This did not help.  May 29 she went to [facility] - pantoprazole tid 1 week. This was not helping. She was given another medication, however cannot recall the name.  She started "getting a handle" on things.  Jun 27 - started developing heartburn along with belching. She went to the emergency department and was put on IV pepsid and oral GI cocktail. Negative CT abdomen and pelvis Rx for thorazine - this made her fall asleep. Could not take this due to drowsiness.  Last week she was another GI provider and Rx for linzess 90 mcg. US abdomen positive for mild fatty liver She has seen GI and has had an upper GI (5/21) which showed reflux.  She has had a negative [place] (6/7). Has f/u appt in the next few weeks.    Sometime within the past 2 months she stopped taking Paxil.  States symptoms worsened after this.  She is still not taking it She has never had this problem before. Review of Systems  Constitutional: Negative for chills and fever.  Gastrointestinal: Positive for abdominal pain. Negative for diarrhea, nausea and vomiting.  Psychiatric/Behavioral: Positive for dysphoric mood. The patient is nervous/anxious.     Patient Active Problem List   Diagnosis Date Noted  . Belching 02/14/2018  . History of positive PPD 06/07/2017  . MDD (major depressive disorder), single episode, severe , no psychosis (HCC) 06/19/2015    Current Outpatient Medications on File Prior to Visit  Medication Sig Dispense Refill  . Ascorbic Acid  (VITAMIN C) 100 MG tablet Take 100 mg by mouth daily.    . Probiotic Product (PROBIOTIC-10 PO) Take by mouth.    . baclofen 5 MG TABS Take 5 mg by mouth 3 (three) times daily. As needed belching (Patient not taking: Reported on 04/19/2018) 50 tablet 0  . Biotin 10 MG CAPS Take by mouth.    . chlorproMAZINE (THORAZINE) 25 MG tablet Take 1 tablet (25 mg total) by mouth 3 (three) times daily as needed (belching). (Patient not taking: Reported on 04/19/2018) 30 tablet 0  . pantoprazole (PROTONIX) 40 MG tablet Take 40 mg by mouth daily.    Marland Kitchen PARoxetine (PAXIL) 20 MG tablet Take 1.5 tablets (30 mg total) by mouth daily. (Patient not taking: Reported on 04/19/2018) 45 tablet 1   No current facility-administered medications on file prior to visit.     Allergies  Allergen Reactions  . Other Other (See Comments)    6 years ago patient was given a medication for the flu(possibly tamiflu) and it made her face fell funny.     Objective:  BP 111/73 (BP Location: Right Arm, Patient Position: Sitting, Cuff Size: Normal)   Pulse 84   Temp 98.5 F (36.9 C) (Oral)   Resp 16   Ht 5\' 3"  (1.6 m)   Wt 149 lb (67.6 kg)   LMP 04/05/2018   SpO2 100%   BMI 26.39 kg/m   Physical Exam  Constitutional: She is oriented to person, place, and time.  No distress.  Cardiovascular: Normal rate, regular rhythm and normal heart sounds.  Neurological: She is alert and oriented to person, place, and time.  Skin: Skin is warm and dry.  Psychiatric: Judgment normal.  Vitals reviewed.   Assessment and Plan :  1. Belching -Patient presents for non-resolving belching for the past 3 months. Negative CT abdomen and pelvis, upper GI (5/21) which showed reflux, US abdomen positive for mild fatty liver.  She has tried gas-x, zantac, baclofen, pantoprazole, thorazine, linzess.  I suspect contributing to the problem is uncontrolled depression and anxiety-plan to start Paxil 20 mg daily with instruction to increase to 30 mg if  needed.  Add BuSpar as needed. plan to refer to speech therapy.  For evaluation and treatment.  She will follow-up with me in 3 to 4 weeks. - Ambulatory referral to Speech Therapy  2. MDD (major depressive disorder), single episode, severe , no psychosis (HCC) - PARoxetine (PAXIL) 20 MG tablet; Take 1.5 tablets (30 mg total) by mouth daily.  Dispense: 45 tablet; Refill: 2  3. Anxiety state - busPIRone (BUSPAR) 10 MG tablet; Take 1 tablet (10 mg total) by mouth as needed.  Dispense: 30 tablet; Refill: 2   Marco CollieWhitney Maleke Feria, PA-C  Primary Care at [facility] 04/19/2018 3:28 PM

## 2018-04-19 NOTE — Telephone Encounter (Signed)
Pt. Called to schedule appt. With PCP office today.  Stated she was prev. a pt. of [employer].  C/o continuous belching, heartburn, and mid abdominal pain x 3-4 mos.  Reported that she went to the ER last night, due to mid chest pain. Stated they did an EKG, and advised her to follow up with her PCP, for the above symptoms. Reported she has been eval. by GI, and advised to take a probiotic and Linzess to improve bowel function, and to ask her PCP to consider an antianxiety medication.  C/o "burning" in mid chest that comes and goes, frequent belching, nausea, intermittent vomiting, and mid abdominal pain, to the left of the umbilicus. Reported the duration of the burning in her chest will last as much as half of the day."  Denied difficulty with breathing, but stated that with the frequent belching, she feels the need to stop and deliberately take a deep breath. periodically. Reported she is scheduled to fly to [place] on Friday, and inquired about canceling her trip, due to severity of her symptoms, but was not able to get a refund.  Pt. requesting to be seen today, as the symptoms are wearing her down, and she can't get rest, and finds it difficult and awkward to work, or go anywhere with the constant belching.  Reported she has an appt. to establish care with a provider on 7/26 @ 1:40 PM, but is scheduled to be at the airport by 4:00 PM.  Appt. Scheduled today at 2:40 PM.  (due to length of Triage call x 45 min., allowed 40 min. for today's office appt.) Pt. agrees with plan.              Reason for Disposition . [1] Chest pain lasts > 5 minutes AND [2] occurred > 3 days ago (72 hours) AND [3] NO chest pain or cardiac symptoms now  Answer Assessment - Initial Assessment Questions 1. LOCATION: "Where does it hurt?"       Center of chest between breasts and in mid abdomen to left of umbilicus 2. RADIATION: "Does the pain go anywhere else?" (e.g., into neck, jaw, arms, back)     Does feel something  stuck in her throat; feeling like she is going to vomit 3. ONSET: "When did the chest pain begin?" (Minutes, hours or days)      Intermittent burning sensation in chest  4. PATTERN "Does the pain come and go, or has it been constant since it started?"  "Does it get worse with exertion?"     Comes and goes  5. DURATION: "How long does it last" (e.g., seconds, minutes, hours)     About 1/2 day 6. SEVERITY: "How bad is the pain?"  (e.g., Scale 1-10; mild, moderate, or severe)    - MILD (1-3): doesn't interfere with normal activities     - MODERATE (4-7): interferes with normal activities or awakens from sleep    - SEVERE (8-10): excruciating pain, unable to do any normal activities       Moderate to severe 7. CARDIAC RISK FACTORS: "Do you have any history of heart problems or risk factors for heart disease?" (e.g., prior heart attack, angina; high blood pressure, diabetes, being overweight, high cholesterol, smoking, or strong family history of heart disease)     Denies diabetes, hypertension, high cholesterol, smoking; father and grandmother with hx of stroke; parents with elev. cholest and elev. BP 8. PULMONARY RISK FACTORS: "Do you have any history of lung disease?"  (  e.g., blood clots in lung, asthma, emphysema, birth control pills)     Not asked 9. CAUSE: "What do you think is causing the chest pain?"    Not asked 10. OTHER SYMPTOMS: "Do you have any other symptoms?" (e.g., dizziness, nausea, vomiting, sweating, fever, difficulty breathing, cough)       Nausea, constant burping; feels achy and feels this is because she did not sleep last night 11. PREGNANCY: "Is there any chance you are pregnant?" "When was your last menstrual period?"       LMP 2 weeks ago  Protocols used: CHEST PAIN-A-AH Message from Abbi R Burchel sent at 04/19/2018 9:47 AM EDT   Summary: Heartburn/nausea   Pt is having really bad heartburn, nausea and gas. Pt is also experiencing constipation. Pt has a new pt appt  on 7/26 with Benny LennertSarah Weber.

## 2018-04-19 NOTE — Telephone Encounter (Signed)
Copied from CRM [id number]. Topic: General - Other >> Apr 19, 2018  4:17 PM Stephannie LiSimmons, Nicci Vaughan L, NT wrote: Reason for CRM: [facility] pharmacy called to verify how many times a day the patient is to take  busPIRone (BUSPAR) 10 MG tablet ,please call [phone] (Phone

## 2018-04-21 ENCOUNTER — Encounter

## 2018-04-21 ENCOUNTER — Ambulatory Visit: Payer: BLUE CROSS/BLUE SHIELD | Admitting: Physician Assistant
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
## 2018-04-27 ENCOUNTER — Encounter: Payer: Self-pay | Admitting: *Deleted

## 2018-04-27 NOTE — Progress Notes (Signed)
UPPER GI     IMPRESSION:   Normal stomach      Normal examined      duodenum Biopsed      Bloating and belching     GERD, withoutesophagitis

## 2018-05-09 DIAGNOSIS — F4311 Post-traumatic stress disorder, acute: Secondary | ICD-10-CM | POA: Diagnosis not present

## 2018-05-16 DIAGNOSIS — K5904 Chronic idiopathic constipation: Secondary | ICD-10-CM | POA: Diagnosis not present

## 2018-05-16 DIAGNOSIS — R140 Abdominal distension (gaseous): Secondary | ICD-10-CM | POA: Diagnosis not present

## 2018-05-31 DIAGNOSIS — F4311 Post-traumatic stress disorder, acute: Secondary | ICD-10-CM | POA: Diagnosis not present

## 2018-06-21 ENCOUNTER — Ambulatory Visit: Payer: BLUE CROSS/BLUE SHIELD | Admitting: Physician Assistant

## 2018-06-21 ENCOUNTER — Encounter: Payer: Self-pay | Admitting: Physician Assistant

## 2018-06-21 VITALS — BP 111/77 | HR 77 | Temp 98.0°F | Resp 17 | Ht 63.0 in | Wt 153.0 lb

## 2018-06-21 DIAGNOSIS — R059 Cough, unspecified: Secondary | ICD-10-CM

## 2018-06-21 DIAGNOSIS — R142 Eructation: Secondary | ICD-10-CM | POA: Diagnosis not present

## 2018-06-21 DIAGNOSIS — R350 Frequency of micturition: Secondary | ICD-10-CM

## 2018-06-21 DIAGNOSIS — F419 Anxiety disorder, unspecified: Secondary | ICD-10-CM | POA: Diagnosis not present

## 2018-06-21 DIAGNOSIS — F329 Major depressive disorder, single episode, unspecified: Secondary | ICD-10-CM

## 2018-06-21 DIAGNOSIS — F32A Depression, unspecified: Secondary | ICD-10-CM

## 2018-06-21 DIAGNOSIS — R05 Cough: Secondary | ICD-10-CM

## 2018-06-21 LAB — POCT URINALYSIS DIP (MANUAL ENTRY)
Bilirubin, UA: NEGATIVE
Glucose, UA: NEGATIVE mg/dL
Ketones, POC UA: NEGATIVE mg/dL
Leukocytes, UA: NEGATIVE
Nitrite, UA: NEGATIVE
Protein Ur, POC: NEGATIVE mg/dL
Spec Grav, UA: 1.025 (ref 1.010–1.025)
Urobilinogen, UA: 0.2 U/dL
pH, UA: 6.5 (ref 5.0–8.0)

## 2018-06-21 LAB — POC MICROSCOPIC URINALYSIS (UMFC): Mucus: ABSENT

## 2018-06-21 MED ORDER — BENZONATATE 100 MG PO CAPS: 100.0000 mg | ORAL_CAPSULE | Freq: Three times a day (TID) | ORAL | 0 refills | Status: DC | PRN

## 2018-06-21 MED ORDER — ALPRAZOLAM 0.25 MG PO TABS: 0.2500 mg | ORAL_TABLET | ORAL | 1 refills | Status: DC | PRN

## 2018-06-21 MED ORDER — ESCITALOPRAM OXALATE 10 MG PO TABS: 10.0000 mg | ORAL_TABLET | Freq: Every day | ORAL | 1 refills | Status: DC

## 2018-06-21 NOTE — Patient Instructions (Addendum)
See below for medication instructions.  - Lexapro - you will take this every day. - Xanax is as needed if Buspar is not working.   Start Lexapro 10 mg once daily (morning or evening, with or without food). After 2 weeks you can increase the dose to 20mg if you feel like you need it.  It may take Lexapro a week or two to take effect.  (Please know you need to take this medication every day. Do not miss a dose. Plan to continue taking this medication at least 8-12 months. When you and your medical provider decide it's appropriate, you will need to taper off this medication.). Common side effects include, but are not limited to, GI upset, nausea, vomiting, insomnia, and drowsiness. Typically these side effects will resolve after 2 weeks.  You should not take this if you are or become pregnant.   If Buspar is not helping: Xanax is for acute symptoms: 0.25 - 0.5 mg as needed up to 3x/daily.   Try xanax at night before bed to help your mind stop swirling.  Come back and see me in 4 weeks to recheck.   OTHER:   RELAXATION Consider practicing mindfulness meditation or other relaxation techniques such as deep breathing, prayer, yoga, tai chi, massage. See website mindful.org or the apps Headspace or Calm to help get started.   SLEEP  Regular exercise and reduced caffeine will help you sleep better. Practice good sleep hygeine techniques. See website sleep.org for more information.     For therapy -- [facility] (Terri Bauert) - [phone] [facility] (Beth Kincaid, Ernest McCoy, Heather Kitchens, Karla Townsend) - [phone] [facility] - [phone] [facility] - [phone] Bob Mylan - [phone] Sarah Young - [facility], [phone]  You can call the 24-hour [facility] HelpLine at [phone] or [phone] for immediate assistance. Among several  different types of services, they offer an Intensive oupatient program for mood disorders - which is a group type setting Monday-Friday 9-noon.  You can schedule an assessment by calling the above numbers during which the costs for the program and insurance benefits will be reviewed.    You can always go to [facility] if suicidal or there are walk-in crisis services available at: [facility] [address] [place], [place] [address] [phone]  Hour of operations: Monday-Friday, 8:30-5 p.m.  They take medicare/medicaid and the patient can contact [facility] referral department at [phone] or [email or web address] for more information.       IF you received an x-ray today, you will receive an invoice from [facility]. Please contact [facility] at [phone] with questions or concerns regarding your invoice.   IF you received labwork today, you will receive an invoice from [facility]. Please contact [facility] at [phone] with questions or concerns regarding your invoice.   Our billing staff will not be able to assist you with questions regarding bills from these companies.  You will be contacted with the lab results as soon as they are available. The fastest way to get your results is to activate your My Chart account. Instructions are located on the last page of this paperwork. If you have not heard from us regarding the results in 2 weeks, please contact this office.      
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
## 2018-06-22 DIAGNOSIS — F4311 Post-traumatic stress disorder, acute: Secondary | ICD-10-CM | POA: Diagnosis not present

## 2018-06-23 LAB — URINE CULTURE

## 2018-06-26 ENCOUNTER — Telehealth: Payer: Self-pay | Admitting: Physician Assistant

## 2018-06-26 NOTE — Telephone Encounter (Signed)
Patient needs FMLA forms completed for her last OV for anxiety/depression. I completed what I could from the OV notes and highlighted the areas I was not sure about. I will place the forms in Whitney's box on 06/26/18 please return to the FMLA/Disability box at [address] checkout with in 5-7 business days. Thank you.

## 2018-06-28 ENCOUNTER — Telehealth: Payer: Self-pay | Admitting: Physician Assistant

## 2018-06-28 NOTE — Telephone Encounter (Signed)
Copied from CRM [id number]. Topic: Quick Communication - See Telephone Encounter >> Jun 28, 2018  2:32 PM Luanna Cole wrote: CRM for notification. See Telephone encounter for: 06/28/18. Pt called and stated that she is calling to check status of fmla paper work.  Pt states that we can leave a voicemail. Please advise

## 2018-07-03 DIAGNOSIS — F4311 Post-traumatic stress disorder, acute: Secondary | ICD-10-CM | POA: Diagnosis not present

## 2018-07-03 NOTE — Telephone Encounter (Signed)
FMLA paperwork being dropped off to [employer] box

## 2018-07-03 NOTE — Telephone Encounter (Signed)
Today is the 5th business day have we completed these forms  °

## 2018-07-04 NOTE — Telephone Encounter (Signed)
Paperwork scanned and faxed on 07/04/18

## 2018-07-10 ENCOUNTER — Telehealth: Payer: Self-pay | Admitting: Physician Assistant

## 2018-07-10 IMAGING — DX DG ABDOMEN 1V
1 series · 1 of 1 positions shown · non-contrast
Comparison: 04/07/2017

CLINICAL DATA: Persistent eructation

EXAM:
ABDOMEN - 1 VIEW

[abdomen kub]
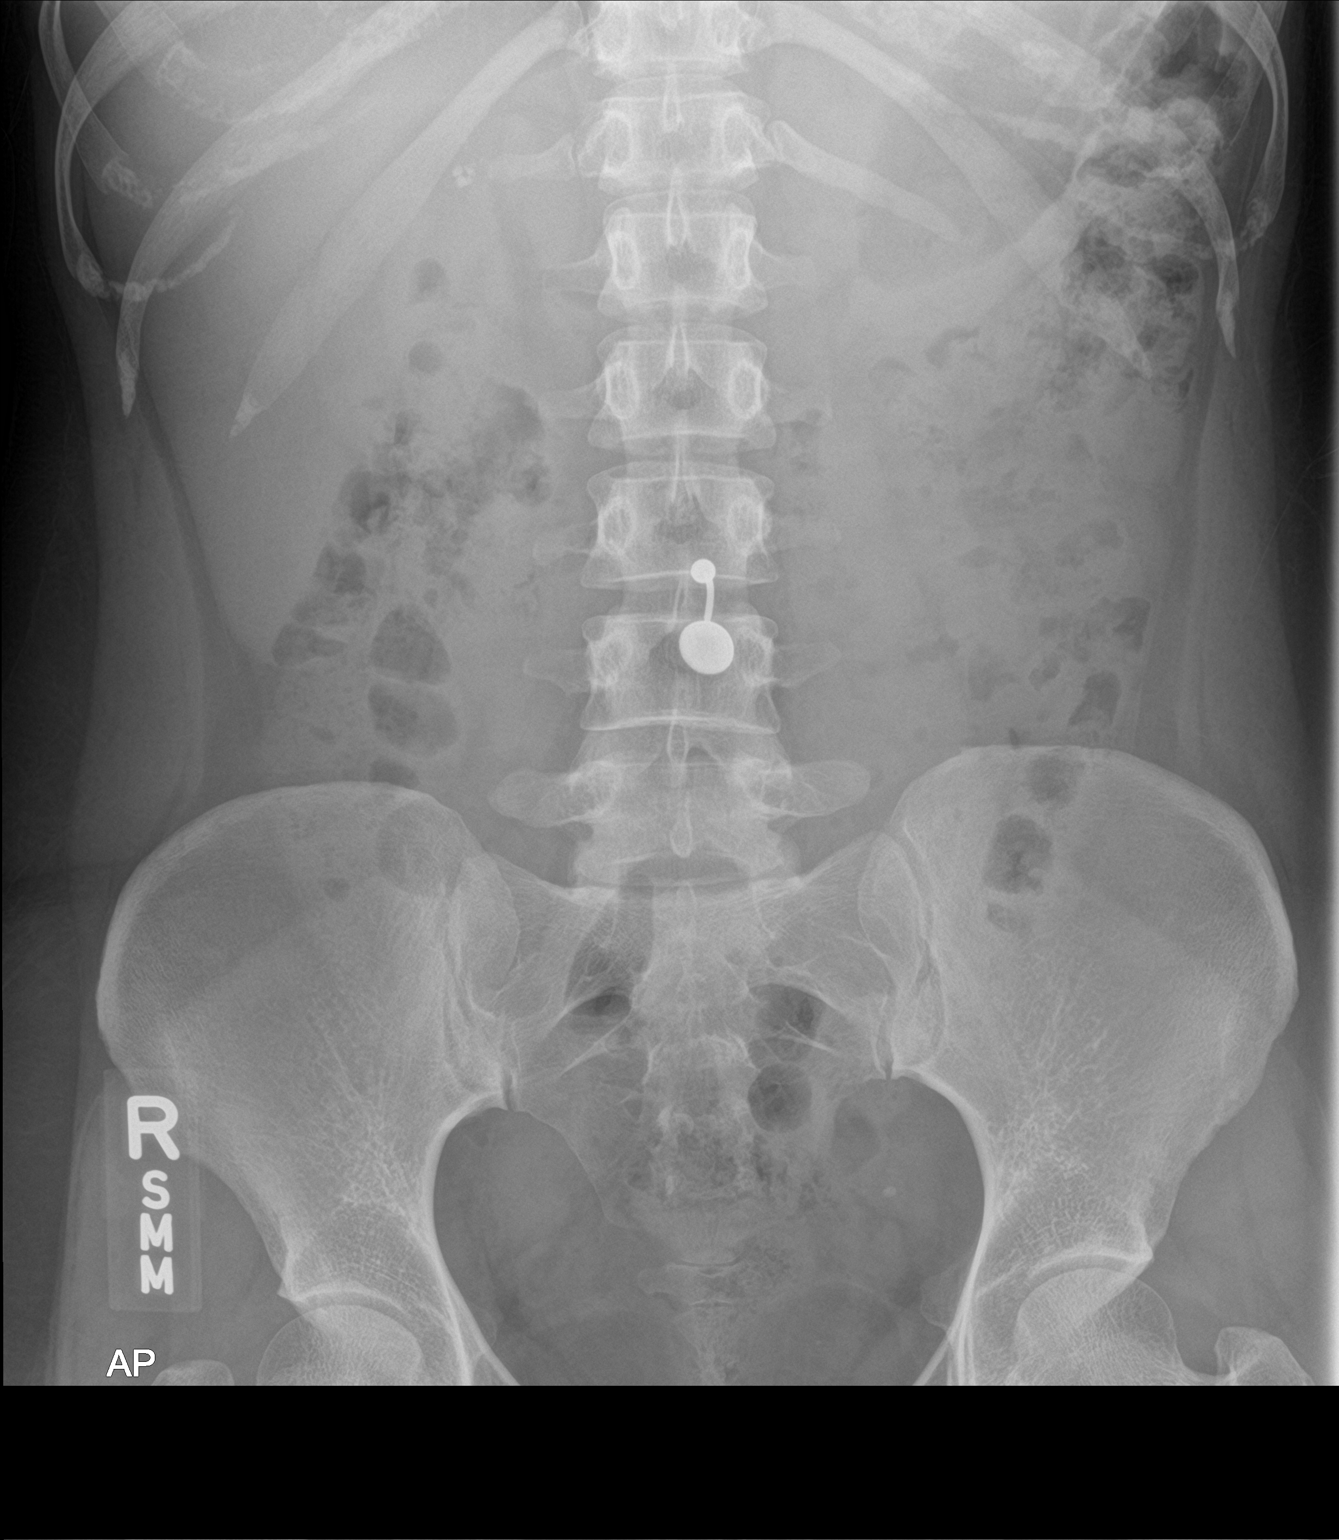

[1 of 1 positions shown; findings below may reference images not displayed]

FINDINGS: Unchanged right upper quadrant calcifications. Normal bowel gas
pattern. The bones are normal.
IMPRESSION: Negative.

## 2018-07-10 NOTE — Telephone Encounter (Signed)
PEC pt message sent to Mc[facility] Re: pt req meds for acid reflux.

## 2018-07-10 NOTE — Telephone Encounter (Unsigned)
Copied from CRM [id number]. Topic: General - Other >> Jul 10, 2018 12:19 PM Gaynelle Adu wrote: Reason for CRM:  Patient is requesting a medication for her  Acid Reflux. She stated in the past she took  Dexilant and that has helped a little. She stated her GI doctor is not  available for another 2- 3 weeks. She is unsure what to do. Please advise.

## 2018-07-11 ENCOUNTER — Other Ambulatory Visit: Payer: Self-pay | Admitting: Physician Assistant

## 2018-07-11 DIAGNOSIS — K219 Gastro-esophageal reflux disease without esophagitis: Secondary | ICD-10-CM

## 2018-07-11 DIAGNOSIS — F4311 Post-traumatic stress disorder, acute: Secondary | ICD-10-CM | POA: Diagnosis not present

## 2018-07-11 MED ORDER — DEXLANSOPRAZOLE 30 MG PO CPDR: 30.0000 mg | DELAYED_RELEASE_CAPSULE | Freq: Every day | ORAL | 0 refills | Status: DC

## 2018-07-11 NOTE — Telephone Encounter (Signed)
Rx sent to pharmacy. Will need refills from GI

## 2018-07-14 ENCOUNTER — Ambulatory Visit: Payer: BLUE CROSS/BLUE SHIELD | Admitting: Physician Assistant

## 2018-07-17 ENCOUNTER — Ambulatory Visit: Payer: Self-pay

## 2018-07-17 NOTE — Telephone Encounter (Signed)
Call placed to patient who called with C/O depression and belching as ongoing symptoms for several months.  Pt is belching frequently while speaking to me. She states that she has see Ms McVey PA and been referred to GI. Pt states she has spoken to behavioral health for help and been to ED. She states that she has a psychiatrist Dr Beatris [address] in [place]. She is out of town and does not wish to see the on call doctor. Pt states today she is out of work because of the belching. She is very embarrassed by the belching.  She has been off her depression medications because she states she is unsure how to take them. Appointment scheduled with W. McVey to review medications with her but pt is urged to call behavioral health to talk to a professional about her depression. She was given the [employer] phone number. She was also told to text [facility]ISE to [phone] for help with anxiety issues. Pt refused to be connected via phone by me to [facility] stating she lives in [place] and there is a [facility] near that she prefers to speak to and she would reach out on her own to them. Pt was urged to call back if the depression becomes worse and she needs help. Pt was advised to go to the nearest ED today for help which she stated "they do nothing and send me home". Pt will see Dr Dala Dock Wednesday as scheduled. Per office she must see her PCP for medication issues. Pt advised.  Reason for Disposition . [1] Depression AND [2] unable to do any of normal activities (e.g., self care, school, work; in comparison to baseline).  Answer Assessment - Initial Assessment Questions 1. CONCERN: "What happened that made you call today?"     Needs to talk to McVey 2. DEPRESSION SYMPTOM SCREENING: "How are you feeling overall?" (e.g., decreased energy, increased sleeping or difficulty sleeping, difficulty concentrating, feelings of sadness, guilt, hopelessness, or worthlessness)  Stressful work life causing belching 3. RISK OF HARM - SUICIDAL IDEATION:  "Do you ever have thoughts of hurting or killing yourself?"  (e.g., yes, no, no but preoccupation with thoughts about death)   - INTENT:  "Do you have thoughts of hurting or killing yourself right NOW?" (e.g., yes, no, N/A)   - PLAN: "Do you have a specific plan for how you would do this?" (e.g., gun, knife, overdose, no plan, N/A)     No not today 4. RISK OF HARM - HOMICIDAL IDEATION:  "Do you ever have thoughts of hurting or killing someone else?"  (e.g., yes, no, no but preoccupation with thoughts about death)   - INTENT:  "Do you have thoughts of hurting or killing someone right NOW?" (e.g., yes, no, N/A)   - PLAN: "Do you have a specific plan for how you would do this?" (e.g., gun, knife, no plan, N/A)      Rough week no plans Calls suicide hotline last week 5. FUNCTIONAL IMPAIRMENT: "How have things been going for you overall in your life? Have you had any more difficulties than usual doing your normal daily activities?"  (e.g., better, same, worse; self-care, school, work, interactions)     Working through this but today needs help with belching issue that interfers with work 6. SUPPORT: "Who is with you now?" "Who do you live with?" "Do you have family or friends nearby who you can talk to?"      no 7. THERAPIST: "  Do you have a counselor or therapist? Name?"     (2017) Dr Marcia Brash, [address] [place] 8. STRESSORS: "Has there been any new stress or recent changes in your life?"      husband fighting all the time parents have issues 9. DRUG ABUSE/ALCOHOL: "Do you drink alcohol or use any illegal drugs?"       Drinks a beer a couple times a week 10. OTHER: "Do you have any other health or medical symptoms right now?" (e.g., fever)       belching 11. PREGNANCY: "Is there any chance you are pregnant?" "When was your last menstrual period?"        On period now  Protocols used: DEPRESSION-A-AH

## 2018-07-19 ENCOUNTER — Encounter: Payer: Self-pay | Admitting: Physician Assistant

## 2018-07-19 ENCOUNTER — Other Ambulatory Visit: Payer: Self-pay

## 2018-07-19 ENCOUNTER — Ambulatory Visit: Payer: BLUE CROSS/BLUE SHIELD | Admitting: Physician Assistant

## 2018-07-19 VITALS — BP 123/85 | HR 77 | Temp 98.2°F | Resp 18 | Ht 63.0 in | Wt 153.0 lb

## 2018-07-19 DIAGNOSIS — F329 Major depressive disorder, single episode, unspecified: Secondary | ICD-10-CM | POA: Diagnosis not present

## 2018-07-19 DIAGNOSIS — R142 Eructation: Secondary | ICD-10-CM | POA: Diagnosis not present

## 2018-07-19 DIAGNOSIS — F32A Depression, unspecified: Secondary | ICD-10-CM

## 2018-07-19 DIAGNOSIS — F419 Anxiety disorder, unspecified: Secondary | ICD-10-CM

## 2018-07-19 NOTE — Progress Notes (Signed)
Adriana Dawson  MRN: [id number] DOB: May 31, 1986  Subjective:  Adriana Dawson is a 32 y.o. female seen in office today for a chief complaint of follow-up on depression and belching.  PCP is PA McVay, who has been following her for this issue.  For belching:Has been followed by GI for this. Has tried baclofen, PPI, thorizine,Taking medications. They stated it was from stress and depression. Seeing natural medicine therapy as welll.  Has not noticed any relief with any other medications.  Continues to belch all during the day.  Denies eating gas provoking foods.  Denies abdominal pain, nausea, vomiting, weight loss, fever, chills, dysphasia, odynophagia.  This is really impacting her life.  Her husband is really worried about her.  She contacted speech therapy but they said they cannot help her.  For depression, she recently discontinued all of her medications.  Just started taking lexapro 20mg  daily, xanax 0.25mg  daily, buspar 10mg , and paxil 20mg  daily x 1 week.  Had prescription bottles for all of his medications and decided to come on because she did not know which when she is supposed to be taking.  Depression does not feel well controlled at this time.  She denies any SI or HI.  She sees a therapist weekly.  Has been recommended to seek care at outpatient behavioral health facility, which she is doing tomorrow.  She will need FMLA filled out if she gets accepted into the program.   Review of Systems Per HPI Patient Active Problem List   Diagnosis Date Noted  . Belching 02/14/2018  . History of positive PPD 06/07/2017  . MDD (major depressive disorder), single episode, severe , no psychosis (HCC) 06/19/2015    Current Outpatient Medications on File Prior to Visit  Medication Sig Dispense Refill  . ALPRAZolam (XANAX) 0.25 MG tablet Take 1-2 tablets (0.25-0.5 mg total) by mouth as needed for anxiety. 20 tablet 1  . busPIRone (BUSPAR) 10 MG tablet Take 1 tablet (10 mg total) by mouth as needed.  30 tablet 2  . escitalopram (LEXAPRO) 10 MG tablet Take 1 tablet (10 mg total) by mouth daily. May increase dose to 20mg  after 3 weeks if needed. 60 tablet 1  . PARoxetine (PAXIL) 20 MG tablet Take 1.5 tablets (30 mg total) by mouth daily. 45 tablet 2  . Ascorbic Acid (VITAMIN C) 100 MG tablet Take 100 mg by mouth daily.    . baclofen 5 MG TABS Take 5 mg by mouth 3 (three) times daily. As needed belching (Patient not taking: Reported on 04/19/2018) 50 tablet 0  . benzonatate (TESSALON) 100 MG capsule Take 1-2 capsules (100-200 mg total) by mouth 3 (three) times daily as needed for cough. (Patient not taking: Reported on 07/19/2018) 40 capsule 0  . Biotin 10 MG CAPS Take by mouth.    . chlorproMAZINE (THORAZINE) 25 MG tablet Take 1 tablet (25 mg total) by mouth 3 (three) times daily as needed (belching). (Patient not taking: Reported on 04/19/2018) 30 tablet 0  . Dexlansoprazole (DEXILANT) 30 MG capsule Take 1 capsule (30 mg total) by mouth daily. (Patient not taking: Reported on 07/19/2018) 30 capsule 0  . pantoprazole (PROTONIX) 40 MG tablet Take 40 mg by mouth daily.    . Probiotic Product (PROBIOTIC-10 PO) Take by mouth.     No current facility-administered medications on file prior to visit.     Allergies  Allergen Reactions  . Other Other (See Comments)    6 years ago patient was given a medication  for the flu(possibly tamiflu) and it made her face fell funny.     Objective:  BP 123/85   Pulse 77   Temp 98.2 F (36.8 C) (Oral)   Resp 18   Ht 5\' 3"  (1.6 m)   Wt 153 lb (69.4 kg)   LMP 07/19/2018   SpO2 98%   BMI 27.10 kg/m   Physical Exam  Constitutional: She is oriented to person, place, and time. She appears well-developed and well-nourished.  HENT:  Head: Normocephalic and atraumatic.  When not talking, pt continuously forcing air into her throat leading to frequent belching.   When talking, no belching noted.    Eyes: Conjunctivae are normal.  Neck: Normal range of  motion.  Pulmonary/Chest: Effort normal.  Neurological: She is alert and oriented to person, place, and time.  Skin: Skin is warm and dry.  Psychiatric: She exhibits a depressed mood.  Vitals reviewed.    Depression screen [facility] 2/9 07/19/2018 06/21/2018 04/19/2018 11/18/2017 11/02/2017  Decreased Interest 3 2 0 2 1  Down, Depressed, Hopeless 3 2 0 2 1  PHQ - 2 Score 6 4 0 4 2  Altered sleeping 3 2 - 2 0  Tired, decreased energy 0 2 - 1 1  Change in appetite 1 2 - 3 3  Feeling bad or failure about yourself  3 0 - 1 0  Trouble concentrating 3 2 - 1 0  Moving slowly or fidgety/restless 3 2 - 1 0  Suicidal thoughts 2 0 - 0 0  PHQ-9 Score 21 14 - 13 6  Difficult doing work/chores - Somewhat difficult - Somewhat difficult -    GAD 7 : Generalized Anxiety Score 07/19/2018 06/04/2016  Nervous, Anxious, on Edge 3 2  Control/stop worrying 3 1  Worry too much - different things 3 2  Trouble relaxing 0 1  Restless 1 1  Easily annoyed or irritable 3 2  Afraid - awful might happen 3 2  Total GAD 7 Score 16 11  Anxiety Difficulty Very difficult -     Assessment and Plan :  1. Anxiety and depression Uncontrolled.  No SI or HI.  Believe she would highly benefit from evaluation by psychiatry.  Strongly encouraged her to go to the behavioral outpatient program.  Will complete FMLA for her.  Also discussed medication management with her as she is on 2 SSRIs right now.  She needs to discontinue Paxil and BuSpar.  Continue with Lexapro.  Use Xanax only as needed.  If any of her depression symptoms worsen, recommend she seek care immediately at [facility], ED. - Ambulatory referral to Psychiatry  2. Belching This is my first time examining patient for this issue.  She has tried multiple regimens in the past without success.  Has been evaluated by GI as well, unfortunately cannot see these records in the system.  I do believe that there is a psych component to patient's belching as you can hear her  actively forcing air into her throat to cause the belching to occur.  She does not belch whenever she is talking.  Strongly encouraged her to attend behavioral outpatient program as I believe this will help.   A total of 25 minutes was spent in the room with the patient, greater than 50% of which was in counseling/coordination of care regarding above.  Benjiman Core PA-C  Primary Care at [facility] 07/19/2018 10:15 AM

## 2018-07-19 NOTE — Patient Instructions (Addendum)
Stop paxil. Stop buspar.  Continue lexapro daily. Use xanax as needed. Follow up with behavioral health tomorrow. If you develop any suicidal thoughts, go immediately to [facility] ED. Contact our office when you know you are starting the outpatient program so you can set up appointment with [facility] to do FMLA forms.     If you have lab work done today you will be contacted with your lab results within the next 2 weeks.  If you have not heard from [place] then please contact us. The fastest way to get your results is to register for My Chart. Suicidal Feelings: How to Help Yourself Suicide is the taking of one's own life. If you feel as though life is getting too tough to handle and are thinking about suicide, get help right away. To get help:  Call your local emergency services (911 in the [place]  Call a suicide hotline to speak with a trained counselor who understands how you are feeling. The following is a list of suicide hotlines in the [place]. For a list of hotlines in [place], visit InkDistributor.it. ? 1-800-273-TALK 210-630-5654). ? 1-800-SUICIDE (786)262-2075). ? 4301150315. This is a hotline for Spanish speakers. ? 1-800-799-4TTY 309-872-1191). This is a hotline for TTY users. ? 1-866-4-U-TREVOR 217-417-4576). This is a hotline for lesbian, gay, bisexual, transgender, or questioning youth.  Contact a crisis center or a local suicide prevention center. To find a crisis center or suicide prevention center: ? Call your local hospital, clinic, community service organization, mental health center, social service provider, or health department. Ask for assistance in connecting to a crisis center. ? Visit https://www.Shadix-king.com/ for a list of crisis centers in the [place], or visit www.suicideprevention.ca/thinking-about-suicide/find-a-crisis-centre for a list of centers in  [place].  Visit the following websites: ? National Suicide Prevention Lifeline: www.suicidepreventionlifeline.org ? Hopeline: www.hopeline.com ? [employer] for Suicide Prevention: https://www.ayers.com/ ? [employer] (for lesbian, gay, bisexual, transgender, or questioning youth): www.thetrevorproject.org  How can I help myself feel better?  Promise yourself that you will not do anything drastic when you have suicidal feelings. Remember, there is hope. Many people have gotten through suicidal thoughts and feelings, and you will, too. You may have gotten through them before, and this proves that you can get through them again.  Let family, friends, teachers, or counselors know how you are feeling. Try not to isolate yourself from those who care about you. Remember, they will want to help you. Talk with someone every day, even if you do not feel sociable. Face-to-face conversation is best.  Call a mental health professional and see one regularly.  Visit your primary health care provider every year.  Eat a well-balanced diet, and space your meals so you eat regularly.  Get plenty of rest.  Avoid alcohol and drugs, and remove them from your home. They will only make you feel worse.  If you are thinking of taking a lot of medicine, give your medicine to someone who can give it to you one day at a time. If you are on antidepressants and are concerned you will overdose, let your health care provider know so he or she can give you safer medicines. Ask your mental health professional about the possible side effects of any medicines you are taking.  Remove weapons, poisons, knives, and anything else that could harm you from your home.  Try to stick to routines. Follow a schedule every day. Put self-care on your schedule.  Make a list of realistic goals, and  cross them off when you achieve them. Accomplishments give a sense of worth.  Wait until you are feeling better before doing the  things you find difficult or unpleasant.  Exercise if you are able. You will feel better if you exercise for even a half hour each day.  Go out in the sun or into nature. This will help you recover from depression faster. If you have a favorite place to walk, go there.  Do the things that have always given you pleasure. Play your favorite music, read a good book, paint a picture, play your favorite instrument, or do anything else that takes your mind off your depression if it is safe to do.  Keep your living space well lit.  When you are feeling well, write yourself a letter about tips and support that you can read when you are not feeling well.  Remember that life's difficulties can be sorted out with help. Conditions can be treated. You can work on thoughts and strategies that serve you well. This information is not intended to replace advice given to you by your health care provider. Make sure you discuss any questions you have with your health care provider. Document Released: 03/20/2003 Document Revised: 05/12/2016 Document Reviewed: 01/08/2014 Elsevier Interactive Patient Education  2018 [employer].   IF you received an x-ray today, you will receive an invoice from [facility]. Please contact [facility] at [phone] with questions or concerns regarding your invoice.   IF you received labwork today, you will receive an invoice from [facility]. Please contact [facility] at [phone] with questions or concerns regarding your invoice.   Our billing staff will not be able to assist you with questions regarding bills from these companies.  You will be contacted with the lab results as soon as they are available. The fastest way to get your results is to activate your My Chart account. Instructions are located on the last page of this paperwork. If you have not heard from [place] regarding the results in 2 weeks, please contact this office.

## 2018-07-20 ENCOUNTER — Other Ambulatory Visit (HOSPITAL_COMMUNITY): Payer: BLUE CROSS/BLUE SHIELD | Attending: Psychiatry | Admitting: Licensed Clinical Social Worker

## 2018-07-20 DIAGNOSIS — Z823 Family history of stroke: Secondary | ICD-10-CM | POA: Diagnosis not present

## 2018-07-20 DIAGNOSIS — Z79899 Other long term (current) drug therapy: Secondary | ICD-10-CM | POA: Diagnosis not present

## 2018-07-20 DIAGNOSIS — F332 Major depressive disorder, recurrent severe without psychotic features: Secondary | ICD-10-CM | POA: Insufficient documentation

## 2018-07-20 DIAGNOSIS — F4310 Post-traumatic stress disorder, unspecified: Secondary | ICD-10-CM

## 2018-07-20 DIAGNOSIS — Z813 Family history of other psychoactive substance abuse and dependence: Secondary | ICD-10-CM | POA: Diagnosis not present

## 2018-07-20 DIAGNOSIS — Z8249 Family history of ischemic heart disease and other diseases of the circulatory system: Secondary | ICD-10-CM | POA: Diagnosis not present

## 2018-07-20 DIAGNOSIS — Z833 Family history of diabetes mellitus: Secondary | ICD-10-CM | POA: Diagnosis not present

## 2018-07-21 IMAGING — RF DG UGI W/ HIGH DENSITY W/KUB
6 series · 14 of 23 positions shown · non-contrast
Comparison: None.

CLINICAL DATA: Frequent burping

EXAM:
UPPER GI SERIES WITH KUB
TECHNIQUE: After obtaining a scout radiograph a routine upper GI series was
performed using thin and high density barium.
FLUOROSCOPY TIME:  Fluoroscopy Time:  1 minutes 18 seconds
Radiation Exposure Index (if provided by the fluoroscopic device):
102 mGy
Number of Acquired Spot Images: 0

[Series 1: one shot · 1 of 1 slices shown (1 of 3)]
[im 1/1]
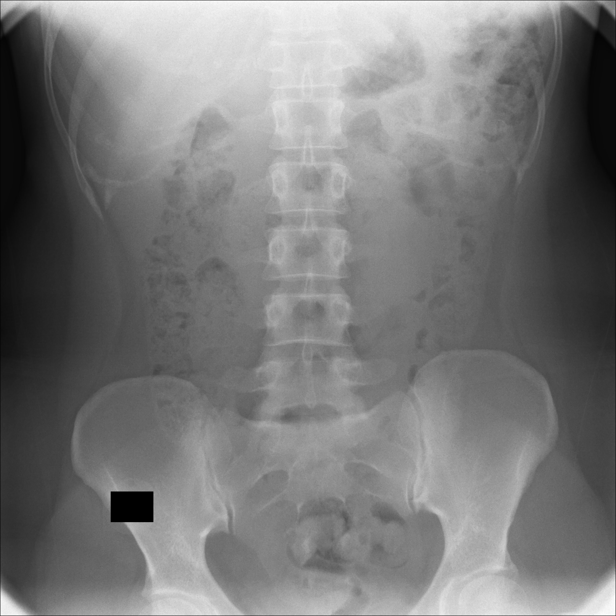

[Series 2: sequence · 2 of 9 frames shown (1 of 3)]
[frame 5/9]
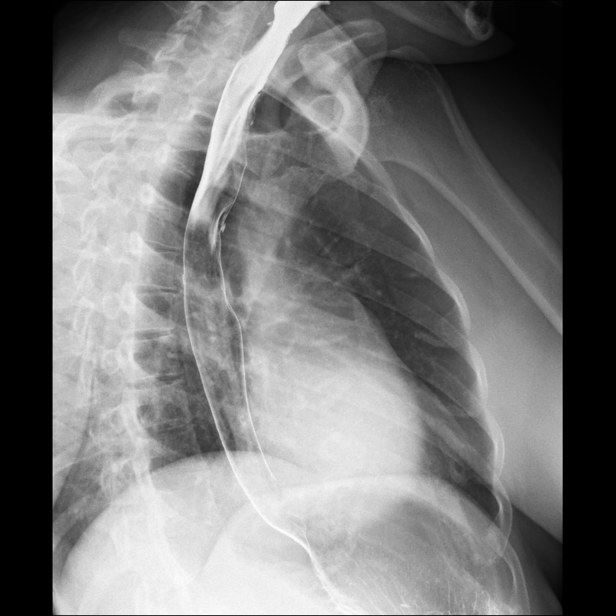
[frame 9/9]
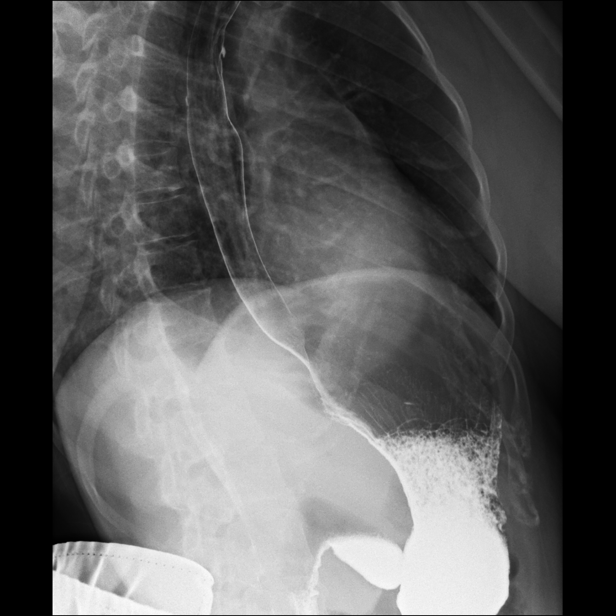

[Series 3: one shot · 2 of 4 slices shown (2 of 3)]
[im 1/4]
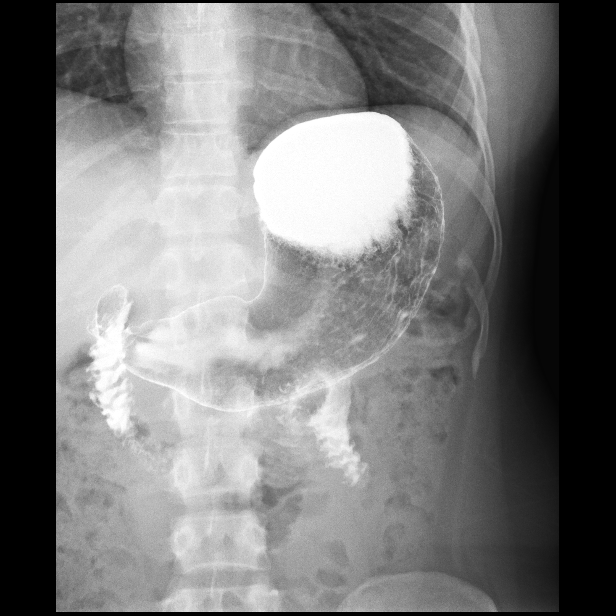
[im 3/4]
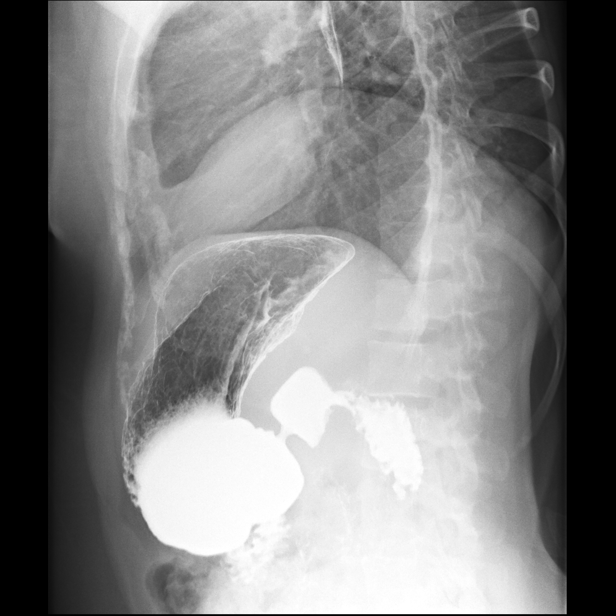

[Series 4: sequence · 3 of 17 frames shown (2 of 3)]
[frame 3/17]
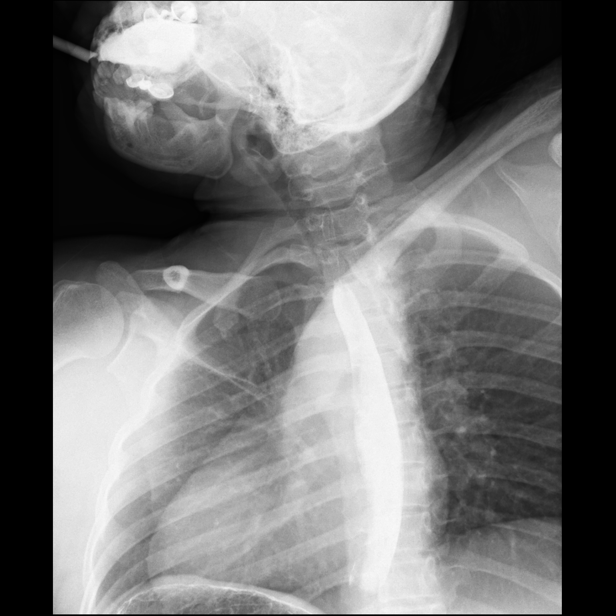
[frame 7/17]
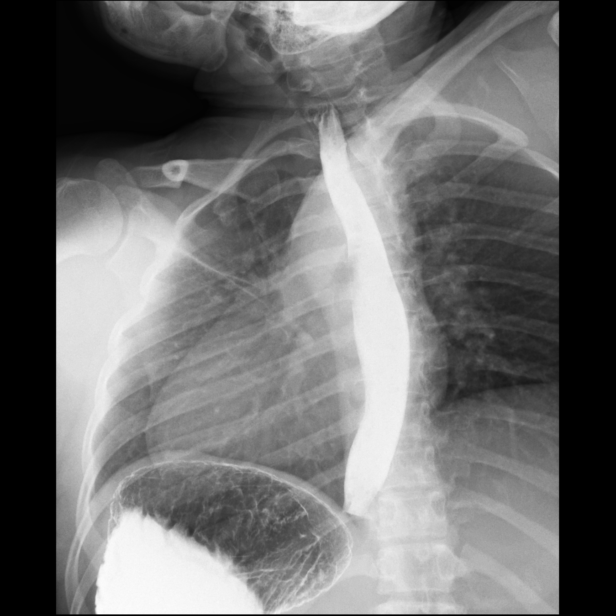
[frame 15/17]
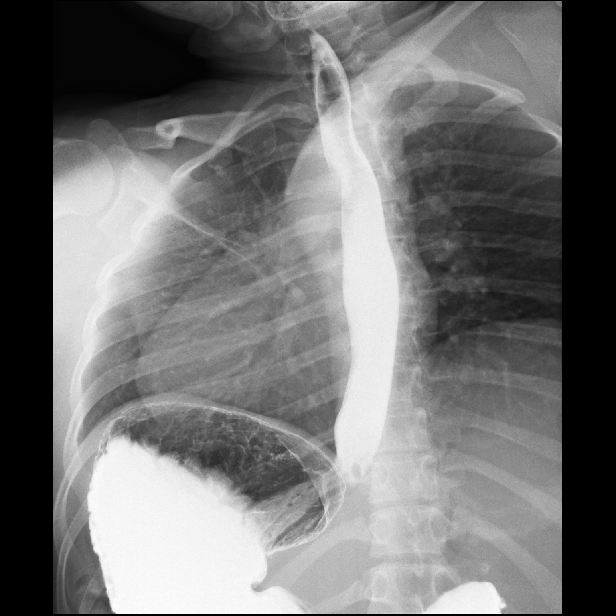

[Series 5: sequence · 2 of 49 frames shown (3 of 3)]
[frame 8/49]
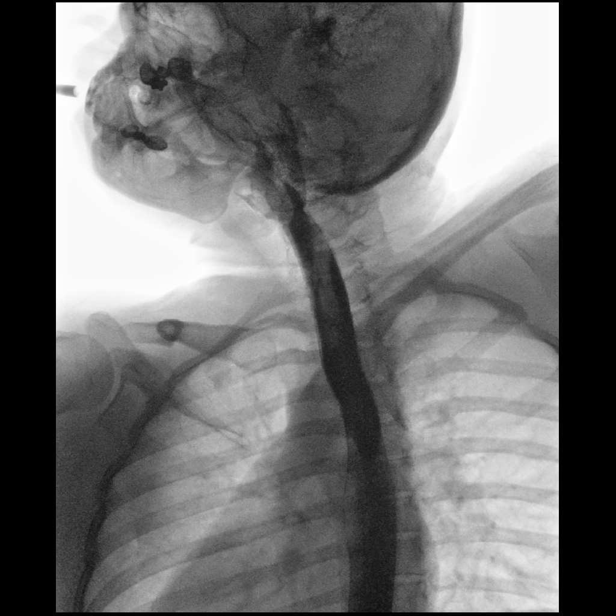
[frame 25/49]
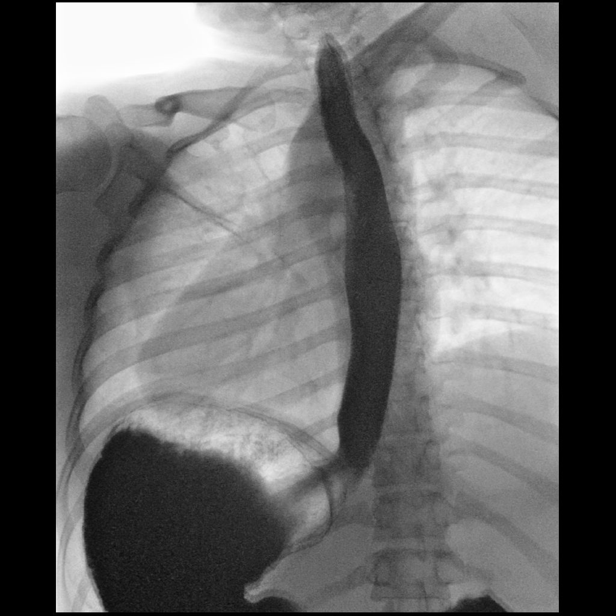

[Series 6: one shot · 4 of 6 slices shown (3 of 3)]
[im 1/6]
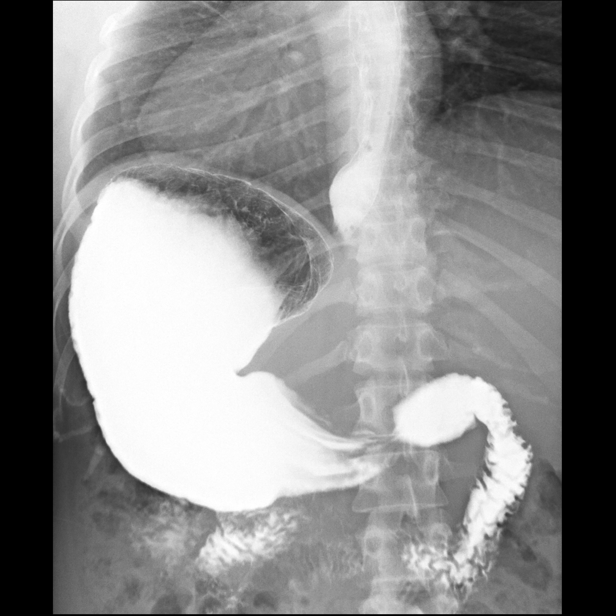
[im 2/6]
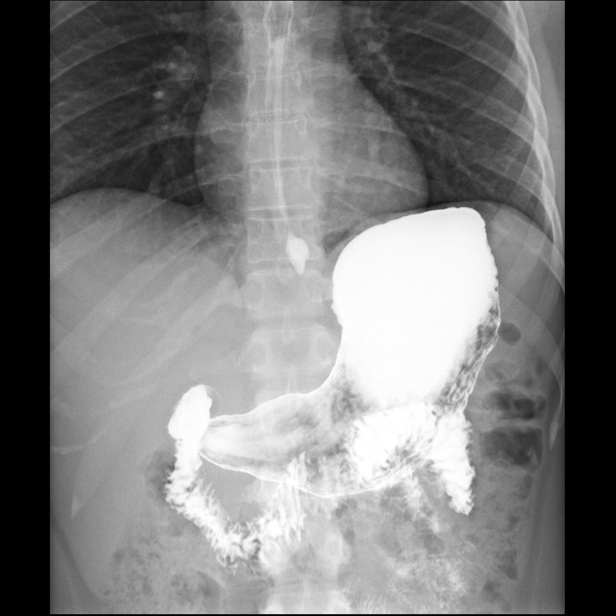
[im 4/6]
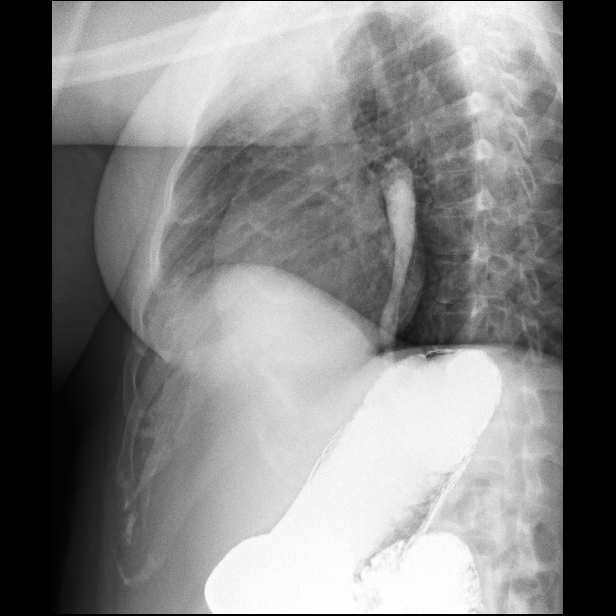
[im 6/6]
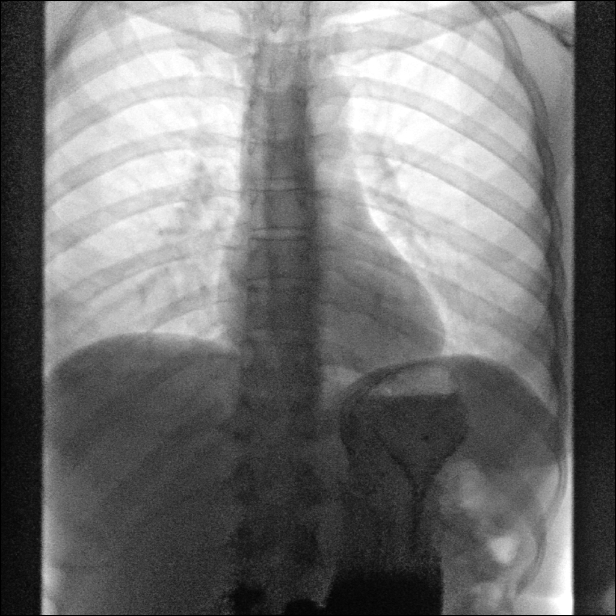

[14 of 23 positions shown; findings below may reference images not displayed]

FINDINGS: A preliminary film of the abdomen shows a nonspecific bowel gas
pattern. Faint calcifications are noted in the right upper quadrant
which could represent gallstones or possibly renal calculi.

A double-contrast study was performed. The mucosa of the esophagus
is unremarkable. No mucosal abnormality is seen. Esophageal
peristalsis appears normal. No hiatal hernia is seen. Mild to
moderate gastroesophageal reflux is demonstrated with the water
siphon maneuver. A barium pill was given at the end the study which
passed into the stomach without delay.

The stomach is normal in contour and peristalsis. The duodenal bulb
fills and the duodenal loop is in normal position.
IMPRESSION: 1. Mild to moderate gastroesophageal reflux.  No hiatal hernia.
2. Barium pill passes into the stomach without delay.
3. No abnormality of the stomach or duodenum is seen.
4. Calcifications in the right upper quadrant may represent renal
calculi or possibly gallstones.

## 2018-07-24 ENCOUNTER — Other Ambulatory Visit: Payer: Self-pay

## 2018-07-24 ENCOUNTER — Other Ambulatory Visit (HOSPITAL_COMMUNITY): Payer: BLUE CROSS/BLUE SHIELD | Admitting: Occupational Therapy

## 2018-07-24 ENCOUNTER — Other Ambulatory Visit (HOSPITAL_COMMUNITY): Payer: BLUE CROSS/BLUE SHIELD | Admitting: Licensed Clinical Social Worker

## 2018-07-24 ENCOUNTER — Encounter (HOSPITAL_COMMUNITY): Payer: Self-pay | Admitting: Occupational Therapy

## 2018-07-24 DIAGNOSIS — F4310 Post-traumatic stress disorder, unspecified: Secondary | ICD-10-CM

## 2018-07-24 DIAGNOSIS — F332 Major depressive disorder, recurrent severe without psychotic features: Secondary | ICD-10-CM

## 2018-07-24 DIAGNOSIS — R4589 Other symptoms and signs involving emotional state: Secondary | ICD-10-CM

## 2018-07-24 DIAGNOSIS — F070 Personality change due to known physiological condition: Secondary | ICD-10-CM
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
## 2018-07-25 ENCOUNTER — Encounter (HOSPITAL_COMMUNITY): Payer: Self-pay | Admitting: Family

## 2018-07-25 ENCOUNTER — Encounter (HOSPITAL_COMMUNITY): Payer: Self-pay | Admitting: Occupational Therapy

## 2018-07-25 ENCOUNTER — Encounter: Payer: Self-pay | Admitting: Physician Assistant

## 2018-07-25 ENCOUNTER — Other Ambulatory Visit (HOSPITAL_COMMUNITY): Payer: BLUE CROSS/BLUE SHIELD | Admitting: Family

## 2018-07-25 ENCOUNTER — Other Ambulatory Visit (INDEPENDENT_AMBULATORY_CARE_PROVIDER_SITE_OTHER): Payer: BLUE CROSS/BLUE SHIELD | Admitting: Occupational Therapy

## 2018-07-25 VITALS — BP 102/66 | HR 67 | Ht 63.0 in | Wt 150.0 lb

## 2018-07-25 DIAGNOSIS — F332 Major depressive disorder, recurrent severe without psychotic features: Secondary | ICD-10-CM

## 2018-07-25 DIAGNOSIS — F4310 Post-traumatic stress disorder, unspecified: Secondary | ICD-10-CM

## 2018-07-25 DIAGNOSIS — F070 Personality change due to known physiological condition: Secondary | ICD-10-CM

## 2018-07-25 DIAGNOSIS — R4589 Other symptoms and signs involving emotional state: Secondary | ICD-10-CM
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
## 2018-07-25 NOTE — Psych (Signed)
[facility] PHP THERAPIST PROGRESS NOTE  Adriana Dawson [id number]  Session Time: 9:00 - 11:00  Participation Level: Active  Behavioral Response: CasualAlertDepressed  Type of Therapy: Group Therapy  Treatment Goals addressed: Coping  Interventions: CBT, DBT, Solution Focused and Reframing  Summary: Clinician led check-in regarding current stressors and situation, and review of patient completed daily inventory. Clinician utilized active listening and empathetic response and validated patient emotions. Clinician facilitated processing group on pertinent issues.   Therapist Response: Fleur Audino is a 32 y.o. female who presents with depression and trauma symptoms. Patient arrived within time allowed and reports that she is feeling "down." Patient rates her mood at a 5 on a scale of 1-10 with 10 being great. Pt states she was not sure up until the time she left if she was coming today or not. Pt shares she is worried about being out of work but decided "I need to do this for me." Pt reports a stressful weekend and that she spent a lot of time alone. Pt states she and her husband had a fight on Thursday and he left to stay with her parents and has not returned. Pt states it was a holiday weekend for her culture and it hurt her that he did not at least reach out for that. Pt reports struggling with relationship and not wanting friends, however wanting support. Pt able to process.  Patient engaged in discussion.      Session Time: 11:00 -12:00  Participation Level: Active  Behavioral Response: CasualAlertDepressed  Type of Therapy: Group Therapy, psychotherapy  Treatment Goals addressed: Coping  Interventions: Psychosocial skills training, Supportive  Summary:  Occupational therapy group  Therapist Response: Patient engaged in group. See OT note.         Session Time: 12:00 - 12:45  Participation Level: Active  Behavioral Response: CasualAlertDepressed  Type  of Therapy: Group Therapy  Treatment Goals addressed: Coping  Interventions: Psychologist, occupational, Supportive  Summary:  Reflection Group: Patients encouraged to practice skills and interpersonal techniques or work on mindfulness and relaxation techniques. The importance of self-care and making skills part of a routine to increase usage were stressed   Therapist Response: Patient engaged and participated appropriately.        Session Time: 12:45- 2:00  Participation Level: Active  Behavioral Response: CasualAlertDepressed  Type of Therapy: Group Therapy, Psychoeducation; Psychotherapy  Treatment Goals addressed: Coping  Interventions: CBT; Solution focused; Supportive; Reframing  Summary: 12:45 - 1:50: Clinician introduced topic of cognitive distortions. [name] educated on what cognitive distortions are and how they affect [place]. [name] introduced "Catch, Challenge, Change" and group began reviewing cognitive distortion handout and came up with examples to work on "catch." 1:50 -2:00 Clinician led check-out. Clinician assessed for immediate needs, medication compliance and efficacy, and safety concerns   Therapist Response: Patient engaged activity and discussion. Pt was able to identify real life examples of cognitive distortions discussed.  At check-out, patient rates her mood at a 6 on a scale of 1-10 with 10 being great. Patient reports afternoon plans of going with her parents to [place] for the [place] celebration. Patient demonstrates some progress as evidenced by participating in her first group session. Patient denies SI/HI/self-harm at the end of group.       Suicidal/Homicidal: Nowithout intent/plan   Plan: Pt will continue in PHP while working to decrease depression and trauma symptoms, increase support, and increase ability to manage symptoms as they arise.    Diagnosis: PTSD (post-traumatic stress  disorder) [F43.10]    1. PTSD (post-traumatic  stress disorder)   2. Severe episode of recurrent major depressive disorder, without psychotic features (HCC)       Donia Guiles, LCSW 07/25/2018
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
## 2018-07-26 ENCOUNTER — Telehealth: Payer: Self-pay | Admitting: Physician Assistant

## 2018-07-26 ENCOUNTER — Encounter (HOSPITAL_COMMUNITY): Payer: Self-pay | Admitting: Family

## 2018-07-26 ENCOUNTER — Other Ambulatory Visit (HOSPITAL_COMMUNITY): Payer: BLUE CROSS/BLUE SHIELD | Admitting: Licensed Clinical Social Worker

## 2018-07-26 DIAGNOSIS — F332 Major depressive disorder, recurrent severe without psychotic features: Secondary | ICD-10-CM

## 2018-07-26 DIAGNOSIS — F4310 Post-traumatic stress disorder, unspecified: Secondary | ICD-10-CM

## 2018-07-26 MED ORDER — PRAZOSIN HCL 1 MG PO CAPS: 1.0000 mg | ORAL_CAPSULE | Freq: Every day | ORAL | 0 refills | Status: DC

## 2018-07-26 MED ORDER — CARBAMAZEPINE ER 100 MG PO TB12: 100.0000 mg | ORAL_TABLET | Freq: Two times a day (BID) | ORAL | 2 refills | Status: DC

## 2018-07-26 MED ORDER — HYDROXYZINE PAMOATE 25 MG PO CAPS: 25.0000 mg | ORAL_CAPSULE | Freq: Three times a day (TID) | ORAL | 0 refills | Status: DC | PRN

## 2018-07-26 NOTE — Telephone Encounter (Signed)
Mychart message sent to pt about having Behavioral Health complete her FMLA forms

## 2018-07-26 NOTE — Telephone Encounter (Signed)
Patient was seen on 07/19/18 by [facility]--it was document that patient would need FMLA forms if accepted into [facility]. I am not sure what to put on the forms. Do you want me to place them in your box blank or can someone at outpatient behavioral health complete the forms?

## 2018-07-26 NOTE — Telephone Encounter (Signed)
I think it would be completely appropriate for them to complete the forms as they are the ones seeing her for this condition and know what her diagnosis/treatment plan is. If they are unwilling to, I can complete them but will need their office notes (which I think I can see through epic).

## 2018-07-26 NOTE — Telephone Encounter (Signed)
Ok I will forward the forms to them. Thank you
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
## 2018-07-26 NOTE — Addendum Note (Signed)
Addended by: Oneta Rack on: 07/26/2018 04:09 PM   Modules accepted: Orders

## 2018-07-27 ENCOUNTER — Other Ambulatory Visit (HOSPITAL_COMMUNITY): Payer: BLUE CROSS/BLUE SHIELD | Admitting: Licensed Clinical Social Worker

## 2018-07-27 ENCOUNTER — Other Ambulatory Visit (HOSPITAL_COMMUNITY): Payer: BLUE CROSS/BLUE SHIELD | Admitting: Occupational Therapy

## 2018-07-27 ENCOUNTER — Encounter (HOSPITAL_COMMUNITY): Payer: Self-pay | Admitting: Occupational Therapy

## 2018-07-27 DIAGNOSIS — R4589 Other symptoms and signs involving emotional state: Secondary | ICD-10-CM

## 2018-07-27 DIAGNOSIS — F4310 Post-traumatic stress disorder, unspecified: Secondary | ICD-10-CM

## 2018-07-27 DIAGNOSIS — F070 Personality change due to known physiological condition: Secondary | ICD-10-CM

## 2018-07-27 DIAGNOSIS — F332 Major depressive disorder, recurrent severe without psychotic features: Secondary | ICD-10-CM
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
## 2018-07-27 NOTE — Psych (Signed)
   [facility] PHP THERAPIST PROGRESS NOTE  Adriana Dawson [id number]  Session Time: 9:00 - 11:00  Participation Level: Active  Behavioral Response: CasualAlertDepressed  Type of Therapy: Group Therapy  Treatment Goals addressed: Coping  Interventions: CBT, DBT, Solution Focused and Reframing  Summary: Clinician led check-in regarding current stressors and situation, and review of patient completed daily inventory. Clinician utilized active listening and empathetic response and validated patient emotions. Clinician facilitated processing group on pertinent issues.   Therapist Response: Zell Doucette is a 32 y.o. female who presents with depression and trauma symptoms. Patient arrived within time allowed and reports that she is feeling "not down, not up". Patient rates her mood at a 5 on a scale of 1-10 with 10 being great. Pt states that she picked her husband up and went home but they did not really talk. Pt. states that when they got home she cooked, cleaned and watched T.V. to keep herself busy due to her "racing mind." Patient continues to struggle with her husband. Patient engaged in discussion.         Session Time: 11:00 -12:15  Participation Level: Active  Behavioral Response: CasualAlertDepressed  Type of Therapy: Group Therapy, psychotherapy  Treatment Goals addressed: Coping  Interventions: Strengths based, reframing, Supportive,   Summary:  Spiritual Care group  Therapist Response: Patient engaged in group. See chaplain note.         Session Time: 12:15 - 1:00  Participation Level: Active  Behavioral Response: CasualAlertDepressed  Type of Therapy: Group Therapy  Treatment Goals addressed: Coping  Interventions: Psychologist, occupational, Supportive  Summary:  Reflection Group: Patients encouraged to practice skills and interpersonal techniques or work on mindfulness and relaxation techniques. The importance of self-care and making skills part of a routine  to increase usage were stressed   Therapist Response: Patient engaged and participated appropriately.        Session Time: 1:00- 2:00  Participation Level: Active  Behavioral Response: CasualAlertDepressed  Type of Therapy: Group Therapy, Psychoeducation  Treatment Goals addressed: Coping  Interventions: relaxation training; Supportive; Reframing  Summary: 12:45 - 1:50: Relaxation group: [name] led group focused on retraining the body's response to stress.   1:50 -2:00 Clinician led check-out. Clinician assessed for immediate needs, medication compliance and efficacy, and safety concerns   Therapist Response: Patient engaged in activity and discussion. At check-out, patient rates her mood at a 6 on a scale of 1-10 with 10 being great. Patient reports afternoon plans to follow-up w/ work and grocery shopping. Patient demonstrates some progress as evidenced by ability to state gratitudes. Patient denies SI/HI/self-harm thoughts at the end of group.      Suicidal/Homicidal: Nowithout intent/plan   Plan: Pt will continue in PHP while working to decrease depression and trauma symptoms, increase support, and increase ability to manage symptoms as they arise.    Diagnosis: PTSD (post-traumatic stress disorder) [F43.10]    1. PTSD (post-traumatic stress disorder)   2. Severe episode of recurrent major depressive disorder, without psychotic features (HCC)       Donia Guiles, LCSW 07/27/2018

## 2018-07-28 ENCOUNTER — Other Ambulatory Visit (HOSPITAL_COMMUNITY): Payer: BLUE CROSS/BLUE SHIELD | Attending: Psychiatry | Admitting: Occupational Therapy

## 2018-07-28 ENCOUNTER — Encounter (HOSPITAL_COMMUNITY): Payer: Self-pay | Admitting: Occupational Therapy

## 2018-07-28 ENCOUNTER — Other Ambulatory Visit (HOSPITAL_COMMUNITY): Payer: BLUE CROSS/BLUE SHIELD | Admitting: Licensed Clinical Social Worker

## 2018-07-28 DIAGNOSIS — F4310 Post-traumatic stress disorder, unspecified: Secondary | ICD-10-CM | POA: Diagnosis not present

## 2018-07-28 DIAGNOSIS — F419 Anxiety disorder, unspecified: Secondary | ICD-10-CM | POA: Insufficient documentation

## 2018-07-28 DIAGNOSIS — F332 Major depressive disorder, recurrent severe without psychotic features: Secondary | ICD-10-CM | POA: Insufficient documentation

## 2018-07-28 DIAGNOSIS — R4589 Other symptoms and signs involving emotional state: Secondary | ICD-10-CM

## 2018-07-28 DIAGNOSIS — F070 Personality change due to known physiological condition: Secondary | ICD-10-CM

## 2018-07-28 DIAGNOSIS — Z91410 Personal history of adult physical and sexual abuse: Secondary | ICD-10-CM | POA: Diagnosis not present

## 2018-07-28 NOTE — Progress Notes (Signed)
GROUP NOTE - spiritual care group 07/26/2018 11:00 - 12:00 ?Facilitated by Wilkie Aye, MDiv, BCC.  ? Group focused on topic of strength. ?Group members reflected on what thoughts and feelings emerge when they hear this topic. ?They then engaged in facilitated dialog around how strength is present in their lives. This dialog focused on representing what strength had been to them in their lives (images and patterns given) and what they saw as helpful in their life now (what they needed / wanted). ?  ?  Activity drew on Adlerian and narrative framework.   Marrietta was present throughout group.  Engaged in dialog voluntarily.  Presented with blunted affect which brightened with engagement with facilitator and other group members.   Naylah noted strength in being able to hold on to perspective.  Stating she can hold on to recognition that things will change, even when she is feeling this is not possible.   Noted with another group member that it is difficult for her to claim happiness, as she feels nervous that something will happen to take this away.  Described being happy with "getting by with a 5 or 6 rather than hoping for a 9"      Burnis Kingfisher, MDiv, [facility]
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
## 2018-07-31 ENCOUNTER — Encounter (HOSPITAL_COMMUNITY): Payer: Self-pay

## 2018-07-31 ENCOUNTER — Encounter (HOSPITAL_COMMUNITY): Payer: Self-pay | Admitting: Occupational Therapy

## 2018-07-31 ENCOUNTER — Other Ambulatory Visit (HOSPITAL_COMMUNITY): Payer: BLUE CROSS/BLUE SHIELD | Admitting: Licensed Clinical Social Worker

## 2018-07-31 ENCOUNTER — Other Ambulatory Visit (HOSPITAL_COMMUNITY): Payer: BLUE CROSS/BLUE SHIELD | Admitting: Occupational Therapy

## 2018-07-31 VITALS — BP 104/72 | HR 84 | Ht 63.0 in | Wt 151.0 lb

## 2018-07-31 DIAGNOSIS — F332 Major depressive disorder, recurrent severe without psychotic features: Secondary | ICD-10-CM

## 2018-07-31 DIAGNOSIS — Z91410 Personal history of adult physical and sexual abuse: Secondary | ICD-10-CM | POA: Diagnosis not present

## 2018-07-31 DIAGNOSIS — F4310 Post-traumatic stress disorder, unspecified: Secondary | ICD-10-CM | POA: Diagnosis not present

## 2018-07-31 DIAGNOSIS — F070 Personality change due to known physiological condition: Secondary | ICD-10-CM

## 2018-07-31 DIAGNOSIS — R4589 Other symptoms and signs involving emotional state: Secondary | ICD-10-CM

## 2018-07-31 DIAGNOSIS — F419 Anxiety disorder, unspecified: Secondary | ICD-10-CM | POA: Diagnosis not present

## 2018-07-31 NOTE — Telephone Encounter (Signed)
Pt is calling back to check status of FMLA papers

## 2018-07-31 NOTE — Progress Notes (Signed)
Patient presented with sad affect, depressed mood and rated her depression a 7, anxiety 4, and hopelessness a 7 on a scale of 0-10 with 0 being none and 10 the worst she could manage.  States her family has noticed some improvement and is sleeping 5-6 hours a night, appetite okay and denies any current suicidal or homicidal ideations, no auditory or visual hallucinations and no plan or intent to harm self or others.  Reports processing in group tends to be helpful but admits she has started burping more again and is not sure if this is related to her way of managing stress or not as no physiological explanation has been found yet. Patient still taking medications for this and reports takes Lexapro daily for the past 2 weeks.  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  Patient considering if she wants to find another job later but agrees has time to make this decision while out.  Patient denied any problems with medications or other concerns at this time and will see NP this week to update on medications.  Denies any side effects currently.
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
## 2018-07-31 NOTE — Telephone Encounter (Signed)
MyChart message was sent to pt about having behavioral health complete her FMLA forms because [place] does not feel comfortable completing them since she isnt the one treating her.  Please have patient check her MyChart messages.  Thank you

## 2018-08-01 ENCOUNTER — Encounter (HOSPITAL_COMMUNITY): Payer: Self-pay | Admitting: Occupational Therapy

## 2018-08-01 ENCOUNTER — Other Ambulatory Visit (HOSPITAL_COMMUNITY): Payer: BLUE CROSS/BLUE SHIELD | Admitting: Licensed Clinical Social Worker

## 2018-08-01 ENCOUNTER — Other Ambulatory Visit (HOSPITAL_COMMUNITY): Payer: BLUE CROSS/BLUE SHIELD | Admitting: Occupational Therapy

## 2018-08-01 DIAGNOSIS — F332 Major depressive disorder, recurrent severe without psychotic features: Secondary | ICD-10-CM

## 2018-08-01 DIAGNOSIS — F4310 Post-traumatic stress disorder, unspecified: Secondary | ICD-10-CM

## 2018-08-01 DIAGNOSIS — Z91410 Personal history of adult physical and sexual abuse: Secondary | ICD-10-CM | POA: Diagnosis not present

## 2018-08-01 DIAGNOSIS — F419 Anxiety disorder, unspecified: Secondary | ICD-10-CM | POA: Diagnosis not present

## 2018-08-01 DIAGNOSIS — F070 Personality change due to known physiological condition: Secondary | ICD-10-CM

## 2018-08-01 DIAGNOSIS — R4589 Other symptoms and signs involving emotional state: Secondary | ICD-10-CM
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
## 2018-08-02 ENCOUNTER — Ambulatory Visit: Payer: BLUE CROSS/BLUE SHIELD | Admitting: Physician Assistant

## 2018-08-02 ENCOUNTER — Other Ambulatory Visit (HOSPITAL_COMMUNITY): Payer: BLUE CROSS/BLUE SHIELD | Admitting: Licensed Clinical Social Worker

## 2018-08-02 ENCOUNTER — Telehealth: Payer: Self-pay | Admitting: Physician Assistant

## 2018-08-02 DIAGNOSIS — F419 Anxiety disorder, unspecified: Secondary | ICD-10-CM | POA: Diagnosis not present

## 2018-08-02 DIAGNOSIS — F332 Major depressive disorder, recurrent severe without psychotic features: Secondary | ICD-10-CM

## 2018-08-02 DIAGNOSIS — F4310 Post-traumatic stress disorder, unspecified: Secondary | ICD-10-CM | POA: Diagnosis not present

## 2018-08-02 DIAGNOSIS — Z91410 Personal history of adult physical and sexual abuse: Secondary | ICD-10-CM | POA: Diagnosis not present

## 2018-08-02 NOTE — Psych (Signed)
A Adriana Dawson [facility] PHP THERAPIST PROGRESS NOTE  Adriana Dawson [id number]  Session Time: 9:00 - 11:00  Participation Level: Active  Behavioral Response: CasualAlertDepressed  Type of Therapy: Group Therapy  Treatment Goals addressed: Coping  Interventions: CBT, DBT, Solution Focused and Reframing  Summary: Clinician led check-in regarding current stressors and situation, and review of patient completed daily inventory. Clinician utilized active listening and empathetic response and validated patient emotions. Clinician facilitated processing group on pertinent issues.   Therapist Response: Adriana Dawson is a 32 y.o. female who presents with depression and trauma symptoms. Patient arrived within time allowed and reports that she is feeling okay. Pt. states that she wants to spend time with her parents but worries about feeling depressed when she leaves them. Patient rates her mood at a 4 on a scale of 1-10 with 10 being great. Pt reports that she got groceries, cooked and cleaned her house yesterday. Pt. expressed desire to speak with husband but was hesitant to do it, because she worried they would fight,and so said nothing. Patient continues to struggle with feelings of isolation and isolating herself. Patient engaged in discussion.        Session Time: 11:00 -12:00  Participation Level: Active  Behavioral Response: CasualAlertDepressed  Type of Therapy: Group Therapy, psychotherapy  Treatment Goals addressed: Coping  Interventions: Psychosocial skills training, Supportive  Summary:  Occupational therapy group  Therapist Response: Patient engaged in group. See OT note.         Session Time: 12:00 - 12:45  Participation Level: Active  Behavioral Response: CasualAlertDepressed  Type of Therapy: Group Therapy  Treatment Goals addressed: Coping  Interventions: Psychologist, occupational, Supportive  Summary:  Reflection Group: Patients encouraged to practice  skills and interpersonal techniques or work on mindfulness and relaxation techniques. The importance of self-care and making skills part of a routine to increase usage were stressed   Therapist Response: Patient engaged and participated appropriately.        Session Time: 12:45- 2:00  Participation Level: Active  Behavioral Response: CasualAlertDepressed  Type of Therapy: Group Therapy, Psychoeducation; Psychotherapy  Treatment Goals addressed: Coping  Interventions: CBT; Solution focused; Supportive; Reframing  Summary: 12:45 - 1:50 [name] led art therapy group. Group used creativity to depict how their mental health feels on bad days and then how they want to feel in the future. Pt's shared their artwork.  1:50 -2:00 Clinician led check-out. Clinician assessed for immediate needs, medication compliance and efficacy, and safety concerns   Therapist Response: Patient engaged in activity. Pt depicted bad days as the snakes of [place], each of them being a different negative feeling. Pt depicted good days as her depression being a smiling monster with positive affirmations around it.  At check-out, patient rates her mood at a 5 on a scale of 1-10 with 10 being great. Patient reports afternoon plans of possibly meeting a friend for coffee. Patient demonstrates some progress as evidenced by continued openness about struggles, however patient seems resistant to trying new behaviors. Patient denies SI/HI/self-harm thoughts at the end of group.     Suicidal/Homicidal: Nowithout intent/plan   Plan: Pt will continue in PHP while working to decrease depression and trauma symptoms, increase support, and increase ability to manage symptoms as they arise.    Diagnosis: Severe episode of recurrent major depressive disorder, without psychotic features (HCC) [F33.2]    1. Severe episode of recurrent major depressive disorder, without psychotic features (HCC)   2. PTSD  (post-traumatic stress disorder)  Donia Guiles, LCSW 08/02/2018

## 2018-08-02 NOTE — Telephone Encounter (Signed)
Forms were faxed on 07/04/18

## 2018-08-02 NOTE — Psych (Signed)
   [facility] PHP THERAPIST PROGRESS NOTE  Adriana Dawson [id number]  Session Time: 9:00 - 10:15  Participation Level: Active  Behavioral Response: CasualAlertDepressed  Type of Therapy: Group Therapy  Treatment Goals addressed: Coping  Interventions: CBT, DBT, Solution Focused and Reframing  Summary: Clinician led check-in regarding current stressors and situation, and review of patient completed daily inventory. Clinician utilized active listening and empathetic response and validated patient emotions. Clinician facilitated processing group on pertinent issues.   Therapist Response: Adriana Dawson is a 32 y.o. female who presents with depression and trauma symptoms. Patient arrived within time allowed and reports that she is feeling "not good." Patient rates her mood at a 4 on a scale of 1-10 with 10 being great. Pt reports she did meet a coworker for coffee yesterday however did not enjoy herself. Patient continues to struggle with talking to her husband and states she did ask him if they could talk, but then changed her mind by the time he answered. Patient engaged in discussion.        Session Time: 10:15 - 11:00  Participation Level: Active  Behavioral Response: CasualAlertDepressed  Type of Therapy: Group Therapy, Psychotherapy  Treatment Goals addressed: Coping  Interventions: CBT, Solution focused, Supportive, Reframing  Summary:  Clinician led discussion on how discipline and effort factor into recovery.       Therapist Response: Pt reports feeling it is difficult to put forth effort and stay disciplined in terms of things that will help her and is unable to say why. [name] suggested that pt's cognitive distortions and struggle with vulnerability may play a role in this, pt was non-committal.          Session Time: 11:00 -12:00   Participation Level: Active   Behavioral Response: CasualAlertDepressed   Type of Therapy: Group Therapy, OT   Treatment Goals addressed:  Coping   Interventions: Psychosocial skills training, Supportive,    Summary:  Occupational Therapy group   Therapist Response: Patient engaged in group. See OT note.            Session Time: 12:00- 1:00  Participation Level: Active  Behavioral Response: CasualAlertDepressed  Type of Therapy: Group Therapy, Psychoeducation; Psychotherapy  Treatment Goals addressed: Coping  Interventions: CBT; Solution focused; Supportive; Reframing  Summary: 12:00 - 12:50 Group watched "On Being Wrong" TedTalk and connected it to cognitive distortions and the power of reframing perception.  12:50 -1:00 Clinician led check-out. Clinician assessed for immediate needs, medication compliance and efficacy, and safety concerns   Therapist Response:  Pt engaged in discussion on perception shifts and is able to identify an area in which doing this would improve her outlook.  At check-out, patient rates her mood at a 5 on a scale of 1-10 with 10 being great. Patient reports no particular plans for the afternoon and weekend. Patient demonstrates some progress as evidenced by increasing consideration of trying new things. Patient denies SI/HI/self-harm at the end of group.     Suicidal/Homicidal: Nowithout intent/plan   Plan: Pt will continue in PHP while working to decrease depression and trauma symptoms, increase support, and increase ability to manage symptoms as they arise.    Diagnosis: Severe episode of recurrent major depressive disorder, without psychotic features (HCC) [F33.2]    1. Severe episode of recurrent major depressive disorder, without psychotic features (HCC)   2. PTSD (post-traumatic stress disorder)       Donia Guiles, LCSW 08/02/2018

## 2018-08-02 NOTE — Telephone Encounter (Signed)
Copied from CRM [id number]. Topic: Quick Communication - See Telephone Encounter >> Aug 02, 2018  9:11 AM Herby Abraham C wrote: CRM for notification. See Telephone encounter for: 08/02/18.   Pt called in to check the status of her paperwork from [employer]. Pt would like a status update.   CB: [phone]

## 2018-08-03 ENCOUNTER — Other Ambulatory Visit (HOSPITAL_COMMUNITY): Payer: BLUE CROSS/BLUE SHIELD | Admitting: Occupational Therapy

## 2018-08-03 ENCOUNTER — Encounter (HOSPITAL_COMMUNITY): Payer: Self-pay | Admitting: Occupational Therapy

## 2018-08-03 ENCOUNTER — Other Ambulatory Visit (HOSPITAL_COMMUNITY): Payer: BLUE CROSS/BLUE SHIELD | Admitting: Licensed Clinical Social Worker

## 2018-08-03 DIAGNOSIS — F070 Personality change due to known physiological condition: Secondary | ICD-10-CM

## 2018-08-03 DIAGNOSIS — F332 Major depressive disorder, recurrent severe without psychotic features: Secondary | ICD-10-CM

## 2018-08-03 DIAGNOSIS — R4589 Other symptoms and signs involving emotional state: Secondary | ICD-10-CM

## 2018-08-03 DIAGNOSIS — F419 Anxiety disorder, unspecified: Secondary | ICD-10-CM | POA: Diagnosis not present

## 2018-08-03 DIAGNOSIS — F4310 Post-traumatic stress disorder, unspecified: Secondary | ICD-10-CM

## 2018-08-03 DIAGNOSIS — Z91410 Personal history of adult physical and sexual abuse: Secondary | ICD-10-CM | POA: Diagnosis not present
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
## 2018-08-03 NOTE — Progress Notes (Signed)
Spiritual care group 08/02/2018 11:00-12:00  Facilitated by Wilkie Aye, MDiv    Group focused on topic of "self-care"  Patients engaged in facilitated discussion about topic.  Explored quotes related to self care and chose one which they agreed with and one which they disliked.  Engaged in discussion around quote choices and their experience / understanding of care for themselves.   Kimbery was present throughout group.  Engaged in group discussion, spoke of self care as having boundaries in relationships.  Related that coming to the group had been self care for her.

## 2018-08-04 ENCOUNTER — Encounter (HOSPITAL_COMMUNITY): Payer: Self-pay | Admitting: Occupational Therapy

## 2018-08-04 ENCOUNTER — Other Ambulatory Visit (HOSPITAL_COMMUNITY): Payer: BLUE CROSS/BLUE SHIELD | Admitting: Licensed Clinical Social Worker

## 2018-08-04 ENCOUNTER — Other Ambulatory Visit (HOSPITAL_COMMUNITY): Payer: BLUE CROSS/BLUE SHIELD | Admitting: Occupational Therapy

## 2018-08-04 DIAGNOSIS — Z91410 Personal history of adult physical and sexual abuse: Secondary | ICD-10-CM | POA: Diagnosis not present

## 2018-08-04 DIAGNOSIS — F332 Major depressive disorder, recurrent severe without psychotic features: Secondary | ICD-10-CM | POA: Diagnosis not present

## 2018-08-04 DIAGNOSIS — F070 Personality change due to known physiological condition: Secondary | ICD-10-CM

## 2018-08-04 DIAGNOSIS — F4310 Post-traumatic stress disorder, unspecified: Secondary | ICD-10-CM

## 2018-08-04 DIAGNOSIS — R4589 Other symptoms and signs involving emotional state: Secondary | ICD-10-CM

## 2018-08-04 DIAGNOSIS — F419 Anxiety disorder, unspecified: Secondary | ICD-10-CM | POA: Diagnosis not present

## 2018-08-04 NOTE — Telephone Encounter (Signed)
Patient was notified that Behavioral health will have to complete her forms now since they are handling her care.
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
## 2018-08-07 ENCOUNTER — Other Ambulatory Visit (HOSPITAL_COMMUNITY): Payer: BLUE CROSS/BLUE SHIELD | Admitting: Occupational Therapy

## 2018-08-07 ENCOUNTER — Encounter (HOSPITAL_COMMUNITY): Payer: Self-pay | Admitting: Occupational Therapy

## 2018-08-07 ENCOUNTER — Other Ambulatory Visit (HOSPITAL_COMMUNITY): Payer: BLUE CROSS/BLUE SHIELD | Admitting: Licensed Clinical Social Worker

## 2018-08-07 DIAGNOSIS — F070 Personality change due to known physiological condition: Secondary | ICD-10-CM

## 2018-08-07 DIAGNOSIS — F332 Major depressive disorder, recurrent severe without psychotic features: Secondary | ICD-10-CM | POA: Diagnosis not present

## 2018-08-07 DIAGNOSIS — Z91410 Personal history of adult physical and sexual abuse: Secondary | ICD-10-CM | POA: Diagnosis not present

## 2018-08-07 DIAGNOSIS — R4589 Other symptoms and signs involving emotional state: Secondary | ICD-10-CM

## 2018-08-07 DIAGNOSIS — F4310 Post-traumatic stress disorder, unspecified: Secondary | ICD-10-CM | POA: Diagnosis not present

## 2018-08-07 DIAGNOSIS — F419 Anxiety disorder, unspecified: Secondary | ICD-10-CM | POA: Diagnosis not present
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
## 2018-08-08 ENCOUNTER — Encounter (HOSPITAL_COMMUNITY): Payer: Self-pay | Admitting: Occupational Therapy

## 2018-08-08 ENCOUNTER — Other Ambulatory Visit (HOSPITAL_COMMUNITY): Payer: BLUE CROSS/BLUE SHIELD | Admitting: Occupational Therapy

## 2018-08-08 ENCOUNTER — Other Ambulatory Visit (HOSPITAL_COMMUNITY): Payer: BLUE CROSS/BLUE SHIELD | Admitting: Licensed Clinical Social Worker

## 2018-08-08 ENCOUNTER — Ambulatory Visit: Payer: BLUE CROSS/BLUE SHIELD | Admitting: Physician Assistant

## 2018-08-08 ENCOUNTER — Encounter (HOSPITAL_COMMUNITY): Payer: Self-pay | Admitting: Family

## 2018-08-08 VITALS — BP 102/64 | HR 72 | Ht 63.0 in | Wt 156.0 lb

## 2018-08-08 DIAGNOSIS — F419 Anxiety disorder, unspecified: Secondary | ICD-10-CM | POA: Diagnosis not present

## 2018-08-08 DIAGNOSIS — Z91410 Personal history of adult physical and sexual abuse: Secondary | ICD-10-CM | POA: Diagnosis not present

## 2018-08-08 DIAGNOSIS — F4310 Post-traumatic stress disorder, unspecified: Secondary | ICD-10-CM

## 2018-08-08 DIAGNOSIS — F332 Major depressive disorder, recurrent severe without psychotic features: Secondary | ICD-10-CM

## 2018-08-08 DIAGNOSIS — R4589 Other symptoms and signs involving emotional state: Secondary | ICD-10-CM

## 2018-08-08 DIAGNOSIS — F070 Personality change due to known physiological condition: Secondary | ICD-10-CM

## 2018-08-08 NOTE — Psych (Signed)
[facility]  Adriana HummingbirdSweta Dawson [id number]  Session Time: 9:00 - 11:00  Participation Level: Active  Behavioral Response: CasualAlertDepressed  Type of Therapy: Group Therapy  Treatment Goals addressed: Coping  Interventions: CBT, DBT, Solution Focused and Reframing  Summary: Clinician led check-in regarding current stressors and situation, and review of patient completed daily inventory. Clinician utilized active listening and empathetic response and validated patient emotions. Clinician facilitated processing group on pertinent issues.   Therapist Response: Adriana Dawson is a 32 y.o. female who presents with depression and trauma symptoms. Patient arrived within time allowed and reports that she is feeling "more bad than good." Patient rates her mood at a 4 on a scale of 1-10 with 10 being great. Pt states her grandmother fell last night and again this morning and her father fell over the weekend. Pt reports she has been struggling with rumination about her family's mortality and what she will do when they die. Pt states both are fine overall, but her grandmother will have to be checked out. Pt reports she was able to distract herself with cooking and states she is happy she is able to spend more time in the kitchen recently.  Pt is struggling with keeping thoughts neutral. Patient engaged in discussion.        Session Time: 11:00 -12:00  Participation Level: Active  Behavioral Response: CasualAlertDepressed  Type of Therapy: Group Therapy, psychotherapy  Treatment Goals addressed: Coping  Interventions: Psychosocial skills training, Supportive  Summary:  Occupational therapy group  Therapist Response: Patient engaged in group. See OT note.         Session Time: 12:00 - 12:45  Participation Level: Active  Behavioral Response: CasualAlertDepressed  Type of Therapy: Group Therapy  Treatment Goals addressed:  Coping  Interventions: Psychologist, occupationalocial Skills Training, Supportive  Summary:  Reflection Group: Patients encouraged to practice skills and interpersonal techniques or work on mindfulness and relaxation techniques. The importance of self-care and making skills part of a routine to increase usage were stressed   Therapist Response: Patient engaged and participated appropriately.        Session Time: 12:45- 2:00  Participation Level: Active  Behavioral Response: CasualAlertDepressed  Type of Therapy: Group Therapy, Psychoeducation; Psychotherapy  Treatment Goals addressed: Coping  Interventions: CBT; Solution focused; Supportive; Reframing  Summary: 12:45 - 1:50 Clinician continued topic of feelings. Clinician led review of topic. Group played feelings Jenga and group members worked to access specificity with feeling recognition. 1:50 -2:00 Clinician led check-out. Clinician assessed for immediate needs, medication compliance and efficacy, and safety concerns.   Therapist Response: Pt participated in activity. Pt is able to recognize and define a spectrum of feeling words and how she has experienced them. At check-out, patient rates her mood at a 5 on a scale of 1-10 with 10 being great. Patient reports afternoon plans of taking her grandmother to the doctor and spending time at home. Patient demonstrates some progress as evidenced by increased recognition of negative thoughts, however needs further work on challenging these thoughts. Patient denies SI/HI/self-harm thoughts at the end of group.     Suicidal/Homicidal: Nowithout intent/plan   Plan: Pt will continue in PHP while working to decrease depression and trauma symptoms, increase support, and increase ability to manage symptoms as they arise.    Diagnosis: Severe episode of recurrent major depressive disorder, without psychotic features (HCC) [F33.2]    1. Severe episode of recurrent major depressive disorder,  without psychotic features (HCC)   2. PTSD (  post-traumatic stress disorder)       Donia Guiles, LCSW 08/08/2018

## 2018-08-08 NOTE — Progress Notes (Signed)
Patient presented with flat affect, depressed mood and reported plans [facility].  Patient denied any suicidal or homicidal ideations, no auditory or visual hallucinations and no plans or intent to want to harm self or others.  Patient rated her current level of depression a 4, anxiety a 9, and hopelessness a 7 on a scale of 0-10 with 0 being none and 10 the worst she could manage.  Stated she was a little more anxious today with pending transition to IOP and that she was concerned her husband did not take things so well when she told them they needed to try couples therapy.  Patient scored a 15 on her PHQ9 depression screening, down from 22 when she first started the program on 07/19/18. Patient reported no current problems with medications or side effects that are new.  Patient continues to burp often, every 20-30 seconds and will follow up with neurologist on this symptom.  Patient now taking a higher dosage of Lexapro, up to 20 mg now and encouraged her to continue with this dosage per Hillery Jacksanika Lewis, NP instructions to her regarding this plan.  Discussed with patient what IOP would offer her and she will contact NP if any problems with her approved transition.

## 2018-08-08 NOTE — Psych (Signed)
[facility] PHP THERAPIST PROGRESS NOTE  Adriana Dawson [id number]  Session Time: 9:00 - 11:00  Participation Level: Active  Behavioral Response: CasualAlertDepressed  Type of Therapy: Group Therapy  Treatment Goals addressed: Coping  Interventions: CBT, DBT, Solution Focused and Reframing  Summary: Clinician led check-in regarding current stressors and situation, and review of patient completed daily inventory. Clinician utilized active listening and empathetic response and validated patient emotions. Clinician facilitated processing group on pertinent issues.   Therapist Response: Adriana Dawson is a 32 y.o. female who presents with depression and trauma symptoms. Patient arrived within time allowed and reports that she is feeling "okay." Patient rates her mood at a 4 on a scale of 1-10 with 10 being great. Pt reports having a "nothing" weekend and staying home mainly. Pt reports continued reticence to engage in relationship or prosocial activities because "there's no point." Pt struggles with catastrophic thinking. Patient engaged in discussion.        Session Time: 11:00 -12:00  Participation Level: Active  Behavioral Response: CasualAlertDepressed  Type of Therapy: Group Therapy, psychotherapy  Treatment Goals addressed: Coping  Interventions: Psychosocial skills training, Supportive  Summary:  Occupational therapy group  Therapist Response: Patient engaged in group. See OT note.         Session Time: 12:00 - 12:45  Participation Level: Active  Behavioral Response: CasualAlertDepressed  Type of Therapy: Group Therapy  Treatment Goals addressed: Coping  Interventions: Psychologist, occupational, Supportive  Summary:  Reflection Group: Patients encouraged to practice skills and interpersonal techniques or work on mindfulness and relaxation techniques. The importance of self-care and making skills part of a routine to increase usage were stressed    Therapist Response: Patient engaged and participated appropriately.        Session Time: 12:45- 2:00  Participation Level: Active  Behavioral Response: CasualAlertDepressed  Type of Therapy: Group Therapy, Psychoeducation; Psychotherapy  Treatment Goals addressed: Coping  Interventions: CBT; Solution focused; Supportive; Reframing  Summary: 12:45 - 1:50 Clinician introduced topic of feelings and emotions. Clinician discussed the way to contextualize feelings as things that come and go and the ability to choose which feelings we attach to. [name] used metaphor of leaves in the wind. Group viewed TED talk "The power of vulnerability" and discussed how vulnerability and other difficult feelings can be thought of differently.  1:50 -2:00 Clinician led check-out. Clinician assessed for immediate needs, medication compliance and efficacy, and safety concerns   Therapist Response: Pt reports understanding of feelings and topics discussed. Pt is able to recognize ways in which this framework can affect how she views feelings however states she is unsure if she will be able to implement in the moment.  At check-out, patient rates her mood at a 5 on a scale of 1-10 with 10 being great. Patient reports afternoon plans of her sister-in-law coming over to study and cooking dinner. Patient demonstrates some progress as evidenced by sharing that she verbalized a concern to her husband over the weekend when normally she just thinks what she wants to say. Patient denies SI/HI/self-harm thoughts at the end of group.     Suicidal/Homicidal: Nowithout intent/plan   Plan: Pt will continue in PHP while working to decrease depression and trauma symptoms, increase support, and increase ability to manage symptoms as they arise.    Diagnosis: Severe episode of recurrent major depressive disorder, without psychotic features (HCC) [F33.2]    1. Severe episode of recurrent major depressive  disorder, without psychotic features (HCC)   2.  PTSD (post-traumatic stress disorder)       Donia GuilesJenny Rooney Swails, LCSW 08/08/2018
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
## 2018-08-09 ENCOUNTER — Encounter (HOSPITAL_COMMUNITY): Payer: Self-pay | Admitting: Family

## 2018-08-09 ENCOUNTER — Other Ambulatory Visit (HOSPITAL_COMMUNITY): Payer: BLUE CROSS/BLUE SHIELD | Admitting: Licensed Clinical Social Worker

## 2018-08-09 DIAGNOSIS — R4589 Other symptoms and signs involving emotional state: Secondary | ICD-10-CM

## 2018-08-09 DIAGNOSIS — F332 Major depressive disorder, recurrent severe without psychotic features: Secondary | ICD-10-CM

## 2018-08-09 DIAGNOSIS — F4310 Post-traumatic stress disorder, unspecified: Secondary | ICD-10-CM | POA: Diagnosis not present

## 2018-08-09 DIAGNOSIS — F419 Anxiety disorder, unspecified: Secondary | ICD-10-CM | POA: Diagnosis not present

## 2018-08-09 DIAGNOSIS — Z91410 Personal history of adult physical and sexual abuse: Secondary | ICD-10-CM | POA: Diagnosis not present
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
## 2018-08-09 NOTE — Psych (Signed)
   [facility] THERAPIST [facility]GRESS [facility]  Adriana HummingbirdSweta Dawson [id number]  Session Time: 9:00 - 11:00  Participation Level: Active  Behavioral Response: CasualAlertDepressed  Type of Therapy: Group Therapy  Treatment Goals addressed: Coping  Interventions: CBT, DBT, Solution Focused and Reframing  Summary: Clinician led check-in regarding current stressors and situation, and review of patient completed daily inventory. Clinician utilized active listening and empathetic response and validated patient emotions. Clinician facilitated processing group on pertinent issues.   Therapist Response: Adriana HummingbirdSweta Folkerts is a 32 y.o. female who presents with depression and trauma symptoms. Patient arrived within time allowed and reports that she is feeling lonely. Patient rates her mood at a 3 on a scale of 1-10 with 10 being great. Pt states she is concerned about her family and the health of her grandmother. Pt states that went for a walk and felt energized during the walk which was exciting. Patient continues to struggle with the consistency in applying skills. Patient engaged in discussion.         Session Time: 11:00 -12:15  Participation Level: Active  Behavioral Response: CasualAlertDepressed  Type of Therapy: Group Therapy, psychotherapy  Treatment Goals addressed: Coping  Interventions: Strengths based, reframing, Supportive,   Summary:  Spiritual Care group  Therapist Response: Patient engaged in group. See chaplain note.         Session Time: 12:15 - 1:00  Participation Level: Active  Behavioral Response: CasualAlertDepressed  Type of Therapy: Group Therapy  Treatment Goals addressed: Coping  Interventions: Psychologist, occupationalocial Skills Training, Supportive  Summary:  Reflection Group: Patients encouraged to practice skills and interpersonal techniques or work on mindfulness and relaxation techniques. The importance of self-care and making skills part of a routine to increase usage were  stressed   Therapist Response: Patient engaged and participated appropriately.        Session Time: 1:00- 2:00  Participation Level: Active  Behavioral Response: CasualAlertDepressed  Type of Therapy: Group Therapy, Psychoeducation  Treatment Goals addressed: Coping  Interventions: relaxation training; Supportive; Reframing  Summary: 12:45 - 1:50: Relaxation group: [name] led group focused on retraining the body's response to stress.   1:50 -2:00 Clinician led check-out. Clinician assessed for immediate needs, medication compliance and efficacy, and safety concerns   Therapist Response: Patient engaged in activity and discussion. At check-out, patient rates her mood at a 5 on a scale of 1-10 with 10 being great. Patient reports afternoon plans of going walking and cooking dinner. Patient demonstrates some progress as evidenced by recognizing the importance of reframing. Patient denies SI/HI/self-harm thoughts at the end of group.       Suicidal/Homicidal: Nowithout intent/plan   Plan: Pt will continue in PHP while working to decrease depression and trauma symptoms, increase support, and increase ability to manage symptoms as they arise.    Diagnosis: Severe episode of recurrent major depressive disorder, without psychotic features (HCC) [F33.2]    1. Severe episode of recurrent major depressive disorder, without psychotic features (HCC)   2. PTSD (post-traumatic stress disorder)       Donia GuilesJenny Fransico Sciandra, LCSW 08/09/2018

## 2018-08-10 ENCOUNTER — Other Ambulatory Visit (HOSPITAL_COMMUNITY): Payer: Self-pay

## 2018-08-10 ENCOUNTER — Other Ambulatory Visit (HOSPITAL_COMMUNITY): Payer: BLUE CROSS/BLUE SHIELD | Admitting: Psychiatry

## 2018-08-10 ENCOUNTER — Encounter (HOSPITAL_COMMUNITY): Payer: Self-pay | Admitting: Family

## 2018-08-10 ENCOUNTER — Ambulatory Visit (HOSPITAL_COMMUNITY): Payer: Self-pay

## 2018-08-10 DIAGNOSIS — F332 Major depressive disorder, recurrent severe without psychotic features: Secondary | ICD-10-CM

## 2018-08-10 NOTE — Psych (Signed)
[name] PHP THERAPIST PROGRESS NOTE  Adriana Dawson [id number]  Session Time: 9:00 - 11:00  Participation Level: Active  Behavioral Response: CasualAlertDepressed  Type of Therapy: Group Therapy  Treatment Goals addressed: Coping  Interventions: CBT, DBT, Solution Focused and Reframing  Summary: Clinician led check-in regarding current stressors and situation, and review of patient completed daily inventory. Clinician utilized active listening and empathetic response and validated patient emotions. Clinician facilitated processing group on pertinent issues.   Therapist Response: Adriana Dawson is a 32 y.o. female who presents with depression and trauma symptoms. Patient arrived within time allowed and reports that she is feeling "very anxious." Patient rates her mood at a 3 on a scale of 1-10 with 10 being great. Pt reports feeling very frustrated yesterday due to feeling as if she could not get the answers she was seeking. Pt reports visiting her school to see about credits and options to finish her degree and going to her doctor's office to find out about paperwork. Pt states that both places gave her the run around and she felt they were dismissive and not willing to help. Pt states she was assertive and did get some of what she needed however felt powerless due to the struggle. Pt states she walked when she got home and is hoping to keep this a habit.  Pt is struggling with diverting her thoughts and not ruminating. Patient engaged in discussion.        Session Time: 11:00 -12:00  Participation Level: Active  Behavioral Response: CasualAlertDepressed  Type of Therapy: Group Therapy, psychotherapy  Treatment Goals addressed: Coping  Interventions: Psychosocial skills training, Supportive  Summary:  Occupational therapy group  Therapist Response: Patient engaged in group. See OT note.         Session Time: 12:00 - 12:45  Participation Level:  Active  Behavioral Response: CasualAlertDepressed  Type of Therapy: Group Therapy  Treatment Goals addressed: Coping  Interventions: Systems analyst, Supportive  Summary:  Reflection Group: Patients encouraged to practice skills and interpersonal techniques or work on mindfulness and relaxation techniques. The importance of self-care and making skills part of a routine to increase usage were stressed   Therapist Response: Patient engaged and participated appropriately.        Session Time: 12:45 - 2:00   Participation Level: Active   Behavioral Response: CasualAlertDepressed   Type of Therapy: Group Therapy, Psychotherapy; Psychoeducation   Treatment Goals addressed: Coping   Interventions: CBT, Solution focused, Supportive, Reframing   Summary: 12:45 - 1:50 [name] introduced topic of boundaries. [name] provided psychoeducation on what boundaries are, porous, rigid and healthy boundary characteristics, and the different types of boundaries. Group related the information to themselves to begin discovering potential boundary issues.  1:50 -2:00 Clinician led check-out. Clinician assessed for immediate needs, medication compliance and efficacy, and safety concerns      Therapist Response: Patient engaged in group. Pt reports understanding of boundaries and shares she has mainly porous boundaries.  At [name], patient rates her mood at a 4 on a scale of 1-10 with 10 being great. Pt reports no specific plans this afternoon. Patient demonstrates some progress as evidenced by being able to be assertive to get her needs met yesterday. Patient denies SI/HI/self-harm thoughts at the end of group.      Suicidal/Homicidal: Nowithout intent/plan   Plan: Pt will continue in PHP while working to decrease depression and trauma symptoms, increase support, and increase ability to manage symptoms as they arise.  Diagnosis: Severe episode of recurrent major depressive  disorder, without psychotic features (HCC) [F33.2]    1. Severe episode of recurrent major depressive disorder, without psychotic features (HCC)   2. PTSD (post-traumatic stress disorder)        , LCSW 08/10/2018 
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
## 2018-08-10 NOTE — Psych (Signed)
[facility] PHP THERAPIST PROGRESS NOTE  Luana Tatro [id number]  Session Time: 9:00 - 10:15  Participation Level: Active  Behavioral Response: CasualAlertDepressed  Type of Therapy: Group Therapy  Treatment Goals addressed: Coping  Interventions: CBT, DBT, Solution Focused and Reframing  Summary: Clinician led check-in regarding current stressors and situation, and review of patient completed daily inventory. Clinician utilized active listening and empathetic response and validated patient emotions. Clinician facilitated processing group on pertinent issues.   Therapist Response: Adriana Dawson is a 32 y.o. female who presents with depression and trauma symptoms. Patient arrived within time allowed and reports that she is feeling "better." Patient rates her mood at a 4 on a scale of 1-10 with 10 being great. Pt states her yesterday was "normal." Pt reports concern for her grandmother because she is not recovering from her fall and pt will take her to the doctor this afternoon. Pt reports she was able to get in her walk, but shorter than planned because of the rain. Patient states her concentration continues to be a struggle. Patient engaged in discussion.        Session Time: 10:15 -11:00  Participation Level: Active  Behavioral Response: CasualAlertDepressed  Type of Therapy: Group Therapy, psychoeducation, psychotherapy  Treatment Goals addressed: Coping  Interventions: CBT, DBT, Solution Focused, Supportive and Reframing  Summary:  Clinician led discussion on hopelessness and how to manage it. Group was able to share and process triggers for their hopelessness and how to reframe.      Therapist Response: Patient engaged and participated in discussion. Pt continues to lean negative with her thought processes and and really struggle to not disqualify the positive. With help from cln and group, pt is able to determine ways to manage hopelessness when it arises.       Session  Time: 11:00 -12:00   Participation Level: Active   Behavioral Response: CasualAlertDepressed   Type of Therapy: Group Therapy, OT   Treatment Goals addressed: Coping   Interventions: Psychosocial skills training, Supportive,    Summary:  Occupational Therapy group   Therapist Response: Patient engaged in group. See OT note.        Session Time: 12:00- 1:00  Participation Level: Active  Behavioral Response: CasualAlertDepressed  Type of Therapy: Group Therapy, Psychoeducation; Psychotherapy  Treatment Goals addressed: Coping  Interventions: CBT; Solution focused; Supportive; Reframing Summary: 12:00 - 12:50: Clinician introduced topic of "Positive Psychology." Group watched "The Happiness Advantage" TED talk and discussed how the "lens" through which they view life affects the way they feel. Pts identified a strategy they would be willing to try to change their "lens."  12:50 -1:00 Clinician led check-out. Clinician assessed for immediate needs, medication compliance and efficacy, and safety concerns.   Therapist Response:  Pt engaged in discussion regarding ways to train your mind to scan for the positive. Pt reports willingness to try daily gratitudes as a way to practice.   At check-out, patient rates her mood at a 4 on a scale of 1-10 with 10 being great. Patient reports afternoon plans of taking her grandmother to the doctor and spending time with family over the weekend. Patient demonstrates some progress as evidenced by keeping up walking in effort to begin new behaviors. Patient denies SI/HI/self-harm at the end of group.    Suicidal/Homicidal: Nowithout intent/plan   Plan: Pt will continue in PHP while working to decrease depression and trauma symptoms, increase support, and increase ability to manage symptoms as they arise.    Diagnosis:  Severe episode of recurrent major depressive disorder, without psychotic features (HCC) [F33.2]    1. Severe episode of  recurrent major depressive disorder, without psychotic features (HCC)   2. PTSD (post-traumatic stress disorder)       Donia GuilesJenny Micha Dosanjh, LCSW 08/10/2018
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
## 2018-08-11 ENCOUNTER — Other Ambulatory Visit (HOSPITAL_COMMUNITY): Payer: BLUE CROSS/BLUE SHIELD

## 2018-08-11 ENCOUNTER — Other Ambulatory Visit (HOSPITAL_COMMUNITY): Payer: Self-pay

## 2018-08-11 ENCOUNTER — Ambulatory Visit (HOSPITAL_COMMUNITY): Payer: Self-pay

## 2018-08-14 ENCOUNTER — Other Ambulatory Visit (HOSPITAL_COMMUNITY): Payer: BLUE CROSS/BLUE SHIELD | Admitting: Licensed Clinical Social Worker

## 2018-08-14 ENCOUNTER — Ambulatory Visit (HOSPITAL_COMMUNITY): Payer: Self-pay

## 2018-08-14 DIAGNOSIS — F332 Major depressive disorder, recurrent severe without psychotic features: Secondary | ICD-10-CM | POA: Diagnosis not present

## 2018-08-14 DIAGNOSIS — Z91410 Personal history of adult physical and sexual abuse: Secondary | ICD-10-CM | POA: Diagnosis not present

## 2018-08-14 DIAGNOSIS — F419 Anxiety disorder, unspecified: Secondary | ICD-10-CM | POA: Diagnosis not present

## 2018-08-14 DIAGNOSIS — F4310 Post-traumatic stress disorder, unspecified: Secondary | ICD-10-CM | POA: Diagnosis not present

## 2018-08-14 NOTE — Progress Notes (Signed)
    Daily Group Progress Note  Program: IOP  Group Time: 9am-12pm   Participation Level: Active  Behavioral Response: Appropriate and Motivated  Type of Therapy:  Group Therapy; psycho-educational group, process group  Summary of Progress:  9am-10am Pharmacist met with group to provide psycho-education related to medications and side effects. Clients were allowed time to ask questions and engage in discussions with any questions or concerns medication and symptom related.  10am-12pm Clinician presented topic of Mindfulness and Difficult Emotions. Clinician provided skills from [facility] on coping with difficult emotions including accepting and labeling emotions, identifying triggers, and responding rather than reacting. Clinician and group members practiced using assertive communication and I-statements to get needs met at work to help manage levels of anxiety. Clinician and group members practiced using positive self talk and gratitude to counteract automatic negative thoughts. Clinician provided Mindfulness handouts focused on coping with feeling overwhelmed and unsure. Clinician praised group members processing of when skills would be useful and how they can be adapted to fit individual client needs. Client participated in group discussions and activities. Client used example of manager to role play assertive communication and setting boundaries.Client was receptive to feedback from group though admitted lack of self confidence could factor into her communication style.   Olegario Messier, LCSW

## 2018-08-15 ENCOUNTER — Ambulatory Visit (HOSPITAL_COMMUNITY): Payer: Self-pay

## 2018-08-15 ENCOUNTER — Other Ambulatory Visit (HOSPITAL_COMMUNITY): Payer: BLUE CROSS/BLUE SHIELD | Admitting: Licensed Clinical Social Worker

## 2018-08-15 DIAGNOSIS — F4310 Post-traumatic stress disorder, unspecified: Secondary | ICD-10-CM | POA: Diagnosis not present

## 2018-08-15 DIAGNOSIS — F419 Anxiety disorder, unspecified: Secondary | ICD-10-CM | POA: Diagnosis not present

## 2018-08-15 DIAGNOSIS — F332 Major depressive disorder, recurrent severe without psychotic features: Secondary | ICD-10-CM | POA: Diagnosis not present

## 2018-08-15 DIAGNOSIS — Z91410 Personal history of adult physical and sexual abuse: Secondary | ICD-10-CM | POA: Diagnosis not present

## 2018-08-15 NOTE — Psych (Signed)
[facility] THERAPIST PROGRESS NOTE  Adriana HummingbirdSweta Dawson [id number]  Session Time: 9:00 - 11:00  Participation Level: Active  Behavioral Response: CasualAlertDepressed  Type of Therapy: Group Therapy  Treatment Goals addressed: Coping  Interventions: CBT, DBT, Solution Focused and Reframing  Summary: Clinician led check-in regarding current stressors and situation, and review of patient completed daily inventory. Clinician utilized active listening and empathetic response and validated patient emotions. Clinician facilitated processing group on pertinent issues.   Therapist Response: Adriana HummingbirdSweta Kupper is a 32 y.o. female who presents with depression and trauma symptoms. Patient arrived within time allowed and reports that she is feeling "low." Patient rates her mood at a 3 on a scale of 1-10 with 10 being great. Pt states she was at the hospital most of the weekend because they admitted her grandmother to complete more tests. Pt reports she felt very stressed during this time as she stayed mainly at the hospital and served as a liaison for her grandmother as she does not speak english and is often confused and combative. Pt states it is also difficult to watch her grandmother in poor health because pt is close with her.  Pt is still noting difficulty in implementing coping skills, however is seeing some improvement in reframing negative thoughts. Patient engaged in discussion.        Session Time: 11:00 -12:00  Participation Level: Active  Behavioral Response: CasualAlertDepressed  Type of Therapy: Group Therapy, psychotherapy  Treatment Goals addressed: Coping  Interventions: Psychosocial skills training, Supportive  Summary:  Occupational therapy group  Therapist Response: Patient engaged in group. See OT note.         Session Time: 12:00 - 12:45  Participation Level: Active  Behavioral Response: CasualAlertDepressed  Type of Therapy: Group  Therapy  Treatment Goals addressed: Coping  Interventions: Psychologist, occupationalocial Skills Training, Supportive  Summary:  Reflection Group: Patients encouraged to practice skills and interpersonal techniques or work on mindfulness and relaxation techniques. The importance of self-care and making skills part of a routine to increase usage were stressed   Therapist Response: Patient engaged and participated appropriately.        Session Time: 12:45 - 2:00  Participation Level: Alert  Behavioral Response: CasualAlertDepressed  Type of Therapy: Group Therapy, Psychoeducation; Psychotherapy  Treatment Goals addressed: Coping  Interventions: CBT; Solution focused; Supportive; Reframing  Summary: 12:45 - 1:50: [name] discussed how to set and maintain boundaries. Group reviewed handout "How to set boundaries" and walked through steps together.  1:50 -2:00 Clinician led check-out. Clinician assessed for immediate needs, medication compliance and efficacy, and safety concerns   Therapist Response: Patient engaged in activity. Pt reports understanding of how to set boundaries, however states feeling uneasy about implementing. At check-out, patient rates her mood at a 5 on a scale of 1-10 with 10 being great. Pt reports afternoon plans of taking her grandmother home from the hospital. Patient demonstrates some progress as evidenced by recognition of boundary issues at work. Patient denies SI/HI/self-harm thoughts at the end of group.      Suicidal/Homicidal: Nowithout intent/plan   Plan: Pt will continue in PHP while working to decrease depression and trauma symptoms, increase support, and increase ability to manage symptoms as they arise.    Diagnosis: Severe episode of recurrent major depressive disorder, without psychotic features (HCC) [F33.2]    1. Severe episode of recurrent major depressive disorder, without psychotic features (HCC)   2. PTSD (post-traumatic stress disorder)        Donia GuilesJenny Daysy Santini, LCSW  08/15/2018 

## 2018-08-15 NOTE — Progress Notes (Signed)
    Daily Group Progress Note  Program: IOP  Group Time: 9am-12pm  Participation Level: Active  Behavioral Response: Appropriate, Sharing and Motivated  Type of Therapy:  Group Therapy; pscyho-educational group, process group  Summary of Progress:  The purpose of this group is to utilize CBT and DBT skills in a group setting to increase use of healthy coping skills and decrease active mental health symptoms.  9am-10:30am Clinician presented DBT Distress Tolerance Skills ACCEPTS. Clinician and group members discussed the importance with the ACCEPT skill to be mindful of only using distraction skills to lessen intensity of emotion, not numb the emotion and return to isolating.  10:30am-12pm Clinician presented DBT Distress Tolerance Skill TIPP, focused on changing body chemistry to reduce extreme emotions quickly. Clinician provided psycho-educational information to clients related to DIVE reflex. Clinician facilitated mindful stretching exercise and mindful breathing exercise by providing clients with 'Mindful Moments' cards and requesting group members learn and teach new skills to each other.  Clinician requested group members share self care activity planned for the rest of the day. Client reports initially being 10/10 for anxiety and depression when walking into group, partially due to poor sleep and having to call into work daily to ensure she is able to keep her job. Client reports new skill of visualizing her anxiety as an object and comparing sizes is a skill she will try using. Client notes by the end of group a decrease in anxiety symptoms.   Harlon DittyKarissa A Isola Mehlman, LCSW

## 2018-08-16 ENCOUNTER — Other Ambulatory Visit (HOSPITAL_COMMUNITY): Payer: BLUE CROSS/BLUE SHIELD | Admitting: Licensed Clinical Social Worker

## 2018-08-16 DIAGNOSIS — Z91410 Personal history of adult physical and sexual abuse: Secondary | ICD-10-CM | POA: Diagnosis not present

## 2018-08-16 DIAGNOSIS — F4310 Post-traumatic stress disorder, unspecified: Secondary | ICD-10-CM | POA: Diagnosis not present

## 2018-08-16 DIAGNOSIS — F332 Major depressive disorder, recurrent severe without psychotic features: Secondary | ICD-10-CM | POA: Diagnosis not present

## 2018-08-16 DIAGNOSIS — F419 Anxiety disorder, unspecified: Secondary | ICD-10-CM | POA: Diagnosis not present

## 2018-08-16 NOTE — Progress Notes (Signed)
    Daily Group Progress Note  Program: IOP  Group Time: 9am-12pm  Participation Level: Active  Behavioral Response: Appropriate  Type of Therapy:  Group Therapy; psycho-educational, processing  Summary of Progress:  The purpose of this group is to utilize CBT and DBT skills in a group setting to increase use of healthy coping skills and decrease intensity of active mental health symptoms.  9am-10:30am Clinician provided psychoeducational material related to Emotional Freedom Techniques, or Tapping. Clinician demonstrated points for clients and provided options for focusing. Clinician explained the goal of the Tapping procedure is to accept a feeling fully, acknowledge the thoughts the feeling creates, and create more helpful or realistic thoughts which in turn decrease the intensity of an unhelpful feeling. Clinician facilitated discussion with group members on the importance of fully identifying and feeling an emotion, uncomfortable or not. Clinician and group members discussed why someone would chose not to feel some feelings and the possible consequences of "stuffing" feelings. Clinician and group members participated in a guided tapping exercise to address thoughts and feelings related to 'Quieting the voice that says You are not enough." Clinician facilitated discussion on clients' responses to activity. Clinician actively listed and validated the need to feel safe when expressing feelings and clients discussion on expectations and barriers to gaining support.  10:30am-12pm Clinician facilitated a discussion on self esteem utilizing admiration and positive self trait exercise Totika. Activity focused on using open-ended questions intended to promote discussions and processing about self-confidence, setting and achieving goals, role models, motivation, personal success and happiness. Client participated in group discussions and activities. Client verbalized consideration of tapping however not  as a regular or public skill as she finds deep breathing more helpful. Client shared about boundaries she has set with her parents to maintain her mental health.  Harlon DittyKarissa A Hikaru Delorenzo, LCSW

## 2018-08-17 ENCOUNTER — Other Ambulatory Visit (HOSPITAL_COMMUNITY): Payer: BLUE CROSS/BLUE SHIELD | Admitting: Psychiatry

## 2018-08-17 ENCOUNTER — Telehealth (HOSPITAL_COMMUNITY): Payer: Self-pay | Admitting: Psychiatry

## 2018-08-17 NOTE — Telephone Encounter (Signed)
D:  Pt didn't attend MH-IOP today, nor did she call Clinical research associatewriter.  A:  Placed call to pt.  According to pt, she had called the main number this morning.  She states she has had a headache since yesterday and became nauseated from the belching.  Plans to return to MH-IOP tomorrow.  Inform treatment team.  R:  Pt receptive.

## 2018-08-17 NOTE — Psych (Signed)
[facility]  Adriana Dawson [id number]  Session Time: 9:00 - 11:00  Participation Level: Active  Behavioral Response: CasualAlertDepressed  Type of Therapy: Group Therapy  Treatment Goals addressed: Coping  Interventions: CBT, DBT, Solution Focused and Reframing  Summary: Clinician led check-in regarding current stressors and situation, and review of patient completed daily inventory. Clinician utilized active listening and empathetic response and validated patient emotions. Clinician facilitated processing group on pertinent issues.   Therapist Response: Adriana Dawson is a 32 y.o. female who presents with depression and trauma symptoms. Patient arrived within time allowed and reports that she is feeling "low." Patient rates her mood at a 3 on a scale of 1-10 with 10 being great. Pt reports she had a "bad night." Pt reports she asked her husband to go to couples therapy and he was defensive and dismissive in her view. Pt states he also made a comment that she took to mean he didn't care about her getting better, which upset her. Pt is able to see ways to reframe, however is mildly resistant to fully believing it. Pt reports having SI later in the evening and feeling "overwhlemingly worthless." Pt states she managed by watching TV, doing her bedtime routine, and reminding herself to wait until therapy tomorrow.  Pt continues to struggle with battling negative thoughts when they arise which sends her to a worse place. Patient engaged in discussion.        Session Time: 11:00 -12:00  Participation Level: Active  Behavioral Response: CasualAlertDepressed  Type of Therapy: Group Therapy, psychotherapy  Treatment Goals addressed: Coping  Interventions: Psychosocial skills training, Supportive  Summary:  Occupational therapy group  Therapist Response: Patient engaged in group. See OT note.         Session Time: 12:00 - 12:45  Participation  Level: Active  Behavioral Response: CasualAlertDepressed  Type of Therapy: Group Therapy  Treatment Goals addressed: Coping  Interventions: Psychologist, occupationalocial Skills Training, Supportive  Summary:  Reflection Group: Patients encouraged to practice skills and interpersonal techniques or work on mindfulness and relaxation techniques. The importance of self-care and making skills part of a routine to increase usage were stressed   Therapist Response: Patient engaged and participated appropriately.        Session Time: 12:45 - 2:00  Participation Level: Alert  Behavioral Response: CasualAlertDepressed  Type of Therapy: Group Therapy, Psychoeducation; Psychotherapy  Treatment Goals addressed: Coping  Interventions: CBT; Solution focused; Supportive; Reframing  Summary: 12:45 - 1:50: [name] facilitated boundaries workshop. Group members discussed boundary issues from their lives and group provided feedback and problem solving based on boundary education.  1:50 -2:00 Clinician led check-out. Clinician assessed for immediate needs, medication compliance and efficacy, and safety concerns   Therapist Response: Patient engaged in activity. Pt shared boundary issues with her partner and struggling with pushing through what she needs. Pt accepted and provided feedback and reports increased understanding of how to set and maintain boundaries.  At check-out, patient rates her mood at a 4 on a scale of 1-10 with 10 being great. Pt reports having no afternoon plans. Patient demonstrates some progress as evidenced by effectively managing SI last night. Patient denies SI/HI/self-harm thoughts at the end of group.      Suicidal/Homicidal: Nowithout intent/plan   Plan: Pt will continue in PHP while working to decrease depression and trauma symptoms, increase support, and increase ability to manage symptoms as they arise.    Diagnosis: Severe episode of recurrent major depressive disorder,  without  psychotic features (HCC) [F33.2]    1. Severe episode of recurrent major depressive disorder, without psychotic features (HCC)   2. PTSD (post-traumatic stress disorder)       Donia Guiles, LCSW 08/17/2018

## 2018-08-17 NOTE — Psych (Signed)
[facility]  Adriana Dawson [id number]  Session Time: 9:00 - 11:00  Participation Level: Active  Behavioral Response: CasualAlertDepressed  Type of Therapy: Group Therapy  Treatment Goals addressed: Coping  Interventions: CBT, DBT, Solution Focused and Reframing  Summary: Clinician led check-in regarding current stressors and situation, and review of patient completed daily inventory. Clinician utilized active listening and empathetic response and validated patient emotions. Clinician facilitated processing group on pertinent issues.   Therapist Response: Adriana HummingbirdSweta Dawson is a 32 y.o. female who presents with depression and trauma symptoms. Patient arrived within time allowed and reports that she is "not feeling good". Patient rates her mood at a 2 on a scale of 1-10 with 10 being great.  Pt states that she went home yesterday and slept and watched T.V. Pt. States she asked her husband to go out with her and he did but she spent the time wondering if he actually wanted to be there. Pt able to recognize cognitive distortions with help from group however is struggling to recognize them on her own. Patient engaged in discussion         Session Time: 11:00 -12:15  Participation Level: Active  Behavioral Response: CasualAlertDepressed  Type of Therapy: Group Therapy, psychotherapy  Treatment Goals addressed: Coping  Interventions: Strengths based, reframing, Supportive,   Summary:  Spiritual Care group  Therapist Response: Patient engaged in group. See chaplain note.         Session Time: 12:15 - 1:00  Participation Level: Active  Behavioral Response: CasualAlertDepressed  Type of Therapy: Group Therapy  Treatment Goals addressed: Coping  Interventions: Psychologist, occupationalocial Skills Training, Supportive  Summary:  Reflection Group: Patients encouraged to practice skills and interpersonal techniques or work on mindfulness and relaxation techniques. The  importance of self-care and making skills part of a routine to increase usage were stressed   Therapist Response: Patient engaged and participated appropriately.        Session Time: 1:00- 2:00  Participation Level: Active  Behavioral Response: CasualAlertAnxious and Depressed  Type of Therapy: Group Therapy, Psychoeducation; Psychotherapy  Treatment Goals addressed: Coping  Interventions: CBT; Solution focused; Supportive; Reframing  Summary: 12:45 - 1:50: [name] continued topic of communication. [name] introduced "I" Statements and how to formulate them as well as common pitfalls and how to avoid them. 1:50 -2:00 Clinician led check-out. Clinician assessed for immediate needs, medication compliance and efficacy, and safety concerns   Therapist Response: Patient engaged in group. Pt reports understanding of "I" Statements and successfully formulated her own in practice.   At check-out, patient rates her mood at a 3 on a scale of 1-10 with 10 being great. Patient reports afternoon plans to do stuff around the house and cook. Patient demonstrates some progress as evidenced by continued utilization of communication skills. Patient denies SI/HI/self-harm thoughts at the end of group.    Suicidal/Homicidal: Nowithout intent/plan   Plan: Pt will discharge from PHP due to meeting treatment goals of decreased depression and trauma symptoms and increased coping ability. Pt's progress is noted by self report, observation, and scales.  Pt will step down to IOP within this agency to work on further stabilization and increasing support system. Pt and provider are aligned with discharge plan. Pt will begin IOP 08/10/18. Pt denies SI/HI/AVH at time of discharge.    Diagnosis: Severe episode of recurrent major depressive disorder, without psychotic features (HCC) [F33.2]    1. Severe episode of recurrent major depressive disorder, without psychotic features (HCC)  2. Difficulty coping   3. PTSD  (post-traumatic stress disorder)       Donia Guiles, LCSW 08/17/2018

## 2018-08-18 ENCOUNTER — Other Ambulatory Visit (HOSPITAL_COMMUNITY): Payer: BLUE CROSS/BLUE SHIELD | Admitting: Licensed Clinical Social Worker

## 2018-08-18 DIAGNOSIS — F4310 Post-traumatic stress disorder, unspecified: Secondary | ICD-10-CM | POA: Diagnosis not present

## 2018-08-18 DIAGNOSIS — F332 Major depressive disorder, recurrent severe without psychotic features: Secondary | ICD-10-CM

## 2018-08-18 DIAGNOSIS — F419 Anxiety disorder, unspecified: Secondary | ICD-10-CM | POA: Diagnosis not present

## 2018-08-18 DIAGNOSIS — Z91410 Personal history of adult physical and sexual abuse: Secondary | ICD-10-CM | POA: Diagnosis not present

## 2018-08-21 ENCOUNTER — Other Ambulatory Visit (HOSPITAL_COMMUNITY): Payer: BLUE CROSS/BLUE SHIELD | Admitting: Licensed Clinical Social Worker

## 2018-08-21 DIAGNOSIS — F4310 Post-traumatic stress disorder, unspecified: Secondary | ICD-10-CM | POA: Diagnosis not present

## 2018-08-21 DIAGNOSIS — F332 Major depressive disorder, recurrent severe without psychotic features: Secondary | ICD-10-CM

## 2018-08-21 DIAGNOSIS — Z91410 Personal history of adult physical and sexual abuse: Secondary | ICD-10-CM | POA: Diagnosis not present

## 2018-08-21 DIAGNOSIS — F419 Anxiety disorder, unspecified: Secondary | ICD-10-CM | POA: Diagnosis not present

## 2018-08-21 NOTE — Progress Notes (Signed)
    Daily Group Progress Note  Program: IOP  Group Time: 9am-12pm  Participation Level: Active  Behavioral Response: Appropriate, Sharing and Motivated  Type of Therapy:  Group Therapy; psycho-educational group, process group  Summary of Progress:  The purpose of this encounter is to utilize CBT and DBT skills in a group setting to increase use of healthy coping skills and decrease intensity of active mental health symptoms.  9am-10:30 am Clinician checked in with group members, assessing for SI/HI/psychosis. Clinician presented ' The Four Aspects of 'Self'." Clinician and group members discussed finding balance and wholeness by addressing physical, mental, emotional, and spiritual aspects of self. Clinician and group members discussed activities which improve each area, and how to add activities into daily living which support changing.  10:30am-12pm: Clinician presented 'I Am' Poem and allowed time for clients to complete. Clinician and group members processed answers, including automatic thoughts related to being better off dead. Clinician praised clients for honesty and being able to identify specific thoughts and create alternative thoughts. Clinician utilized active listening, reflective statements to engage client in conversations. Clinician and group members discussed adding positive self talk into daily life. Clinician reminded clients of the benefits of gratitude in daily life. Client shared creating alternative thoughts is often difficult when depressed. Client identified intermittent passive thoughts related to death with no plan or intent. Client reports reason for living is her family however would like to get to a place she can enjoy living for herself. Client plans to go walking for her self care activity.  Harlon DittyKarissa A Ayssa Bentivegna, LCSW

## 2018-08-22 ENCOUNTER — Other Ambulatory Visit (HOSPITAL_COMMUNITY): Payer: BLUE CROSS/BLUE SHIELD

## 2018-08-23 ENCOUNTER — Other Ambulatory Visit (HOSPITAL_COMMUNITY): Payer: BLUE CROSS/BLUE SHIELD | Admitting: Licensed Clinical Social Worker

## 2018-08-23 DIAGNOSIS — F332 Major depressive disorder, recurrent severe without psychotic features: Secondary | ICD-10-CM | POA: Diagnosis not present

## 2018-08-23 DIAGNOSIS — F419 Anxiety disorder, unspecified: Secondary | ICD-10-CM | POA: Diagnosis not present

## 2018-08-23 DIAGNOSIS — Z91410 Personal history of adult physical and sexual abuse: Secondary | ICD-10-CM | POA: Diagnosis not present

## 2018-08-23 DIAGNOSIS — F4310 Post-traumatic stress disorder, unspecified: Secondary | ICD-10-CM | POA: Diagnosis not present

## 2018-08-23 NOTE — Progress Notes (Signed)
    Daily Group Progress Note  Program: IOP  Group Time: 9am-12pm  Participation Level: Active  Behavioral Response: Appropriate  Type of Therapy:  Group Therapy; process group, psycho-educational group  Summary of Progress:  Clinician presented dual self portrait, showing inner and outer self activity. Clinician and group members described individual protraits and processed differences in inner and outer and what steps could be taken to bring both sense of self closer together. Gropu members discussed how self talk can effect our mood and activities in which we engaged. Clinician reviewed cognitive triangle and the importance of taking time to stop and identify specific thoughts and feelings. Clinician reviewed STOP skill. Clinician facilitated discussions by using open ended questions and summarizing statements.  Clinician presented 'Worry Leslie DalesDoll' activity with the focus on identifying and verbalizing specific triggers for anxiety or depression. Clinician and group members completed activity while reviewing 5 senses grounding activity and discussing how the worry doll could be used with this activity. Clinician provided group members with colorful materials as well as essential oils to use on worry doll to help create a sensory focus when feeling overwhelmed. Client participated in discussions and activities. Client reports grounding skills could be helpful in the work environment if she is able to step away.  Harlon DittyKarissa A Chinyere Galiano, LCSW

## 2018-08-23 NOTE — Progress Notes (Signed)
    Daily Group Progress Note  Program: IOP  Group Time: 9am-12pm  Participation Level: Active  Behavioral Response: Appropriate, Sharing and Motivated  Type of Therapy:  Group Therapy; psycho-educational group, process group  Summary of Progress:  The purpose of this group is to utilize CBT and DBT skills in a group setting to increase use of healthy coping skills and decrease intensity of active mental health symptoms.  9am-10:30am Clinician presented 'Panic List For Distress Tolerance' and reviewed skills to decrease intensity of feelings in the moment. Clinician presented the topic of Cognitive distortions. Clinician and group members discussed how to identify types of distortions and challenge each style. Clinician praised clients on being able to create alternative thoughts. Clinician and group members processed how distortions effect how we experience and respond to situations.  10:30am-12pm Clinician presented the topic of Self-Validation. Clinician and group members processed the importance of having thoughts and feelings validated. Clinician and group members discussed gaslighting and effects of not feeling validated or supporting in the work environment. Clinician and group members discussed how to use mindfulness and radical acceptance to focus on self validation and responding rather than reacting to people or situations out of individual control. Clinician and group members focused on using I-statements and not negating positive self talk with 'but-statements.' Client participated fully in discussions and activities. Client verbalized barriers in the workplace for coping with anxiety and worries about returning. Client was able to identify several thoughts linked with uncomfortable feelings and create alternative thoughts.  Adriana DittyKarissa A Derita Michelsen, LCSW

## 2018-08-25 IMAGING — CT CT ABD-PELV W/ CM
2 of 4 series · 16 of 46 positions shown, 18 images · IV contrast (Omni 300)
Comparison: None.

CLINICAL DATA: Constant belching with nausea and central chest
pain. No relief with Zantac and Gas-X.

EXAM:
CT ABDOMEN AND PELVIS WITH CONTRAST
TECHNIQUE: Multidetector CT imaging of the abdomen and pelvis was performed
using the standard protocol following bolus administration of
intravenous contrast.
CONTRAST:  100mL OMNIPAQUE IOHEXOL 300 MG/ML  SOLN

[Series 3: a/p w/ 5mm · axial · 0.60mm/px · z∈[+736,+1156]mm · 13 of 92 slices shown, 15 images]
[im 4/92  soft-tissue]
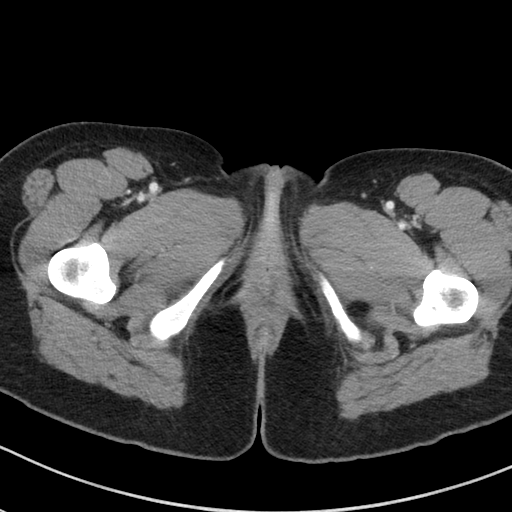
[im 4/92  bone]
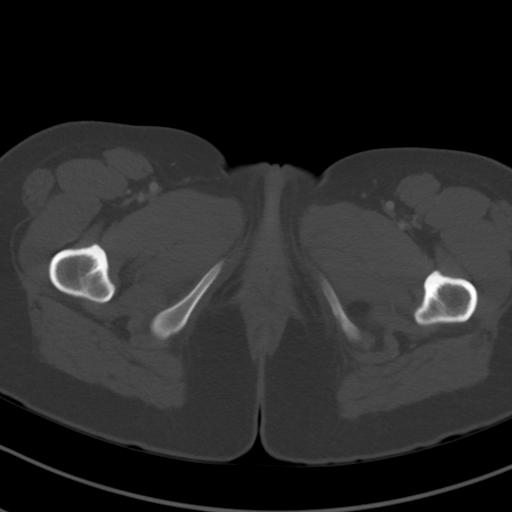
[im 11/92  soft-tissue]
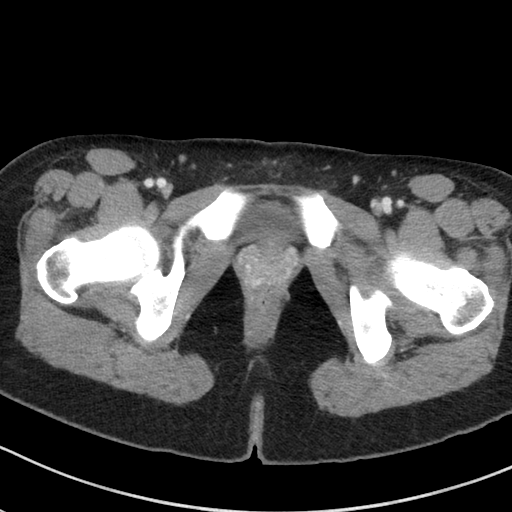
[im 18/92  soft-tissue]
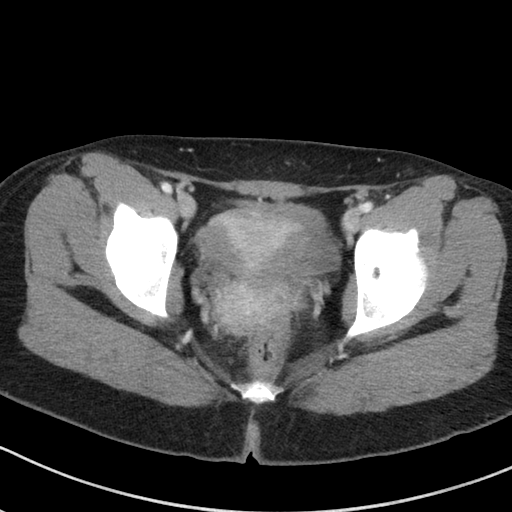
[im 25/92  soft-tissue]
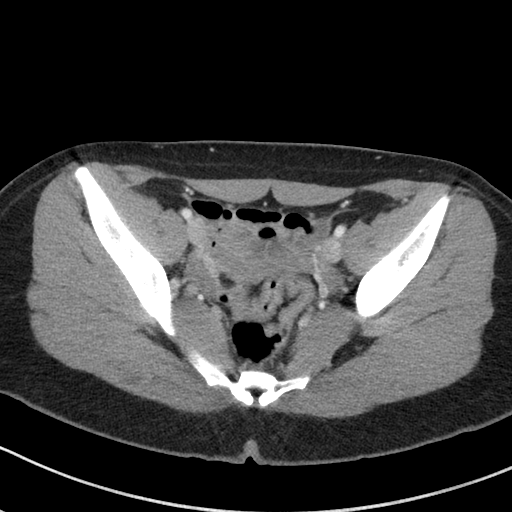
[im 32/92  soft-tissue]
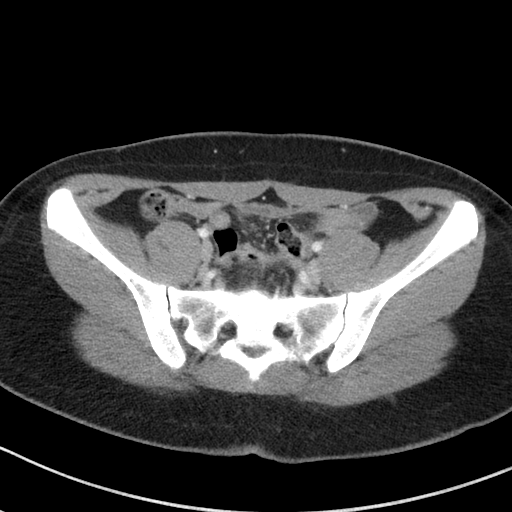
[im 39/92  soft-tissue]
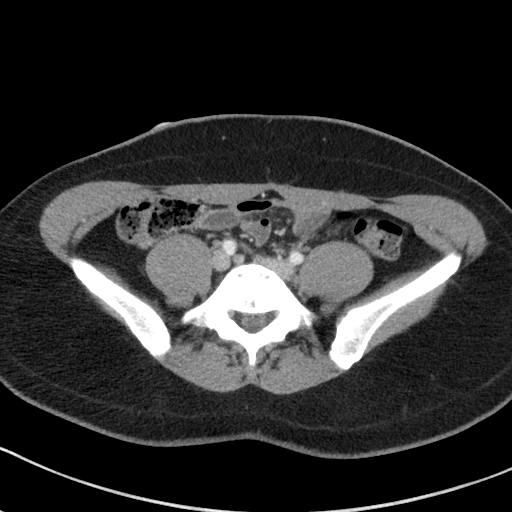
[im 46/92  soft-tissue]
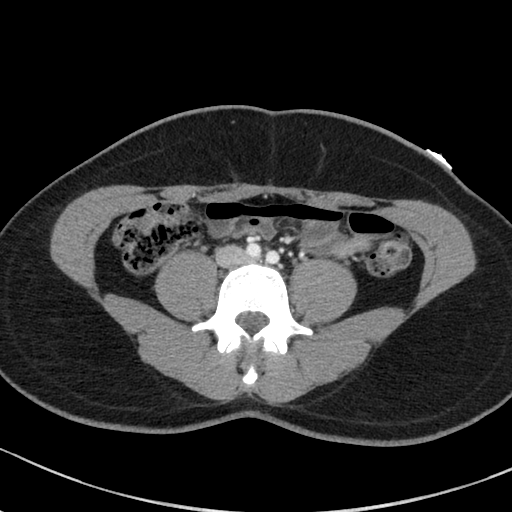
[im 53/92  soft-tissue]
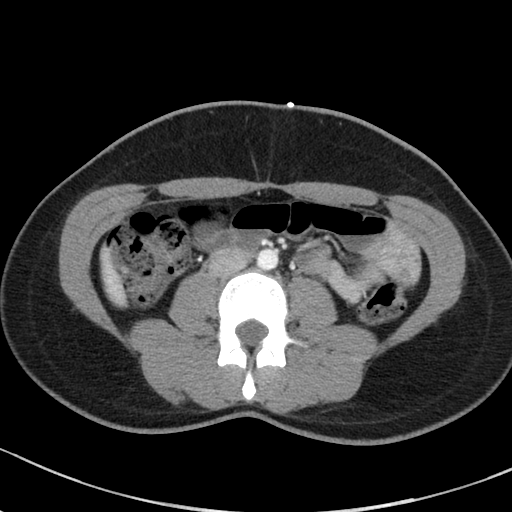
[im 60/92  soft-tissue]
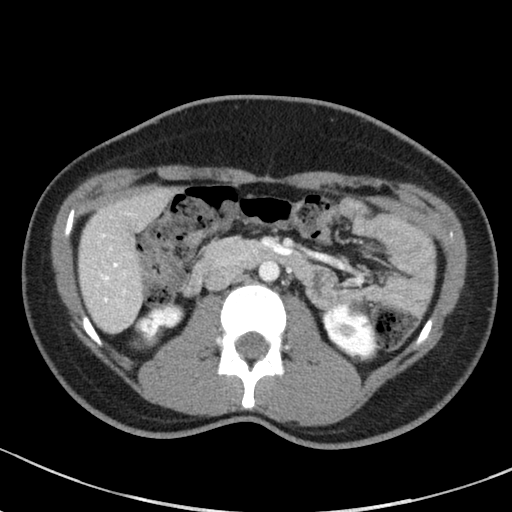
[im 60/92  bone]
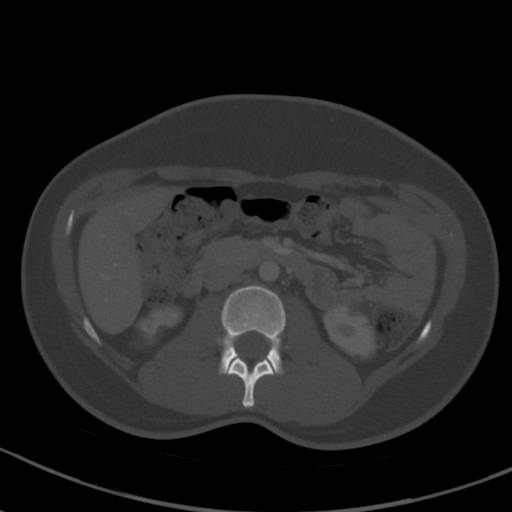
[im 67/92  soft-tissue]
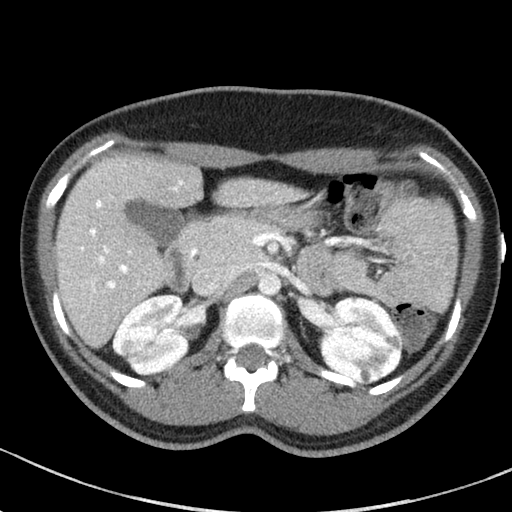
[im 74/92  soft-tissue]
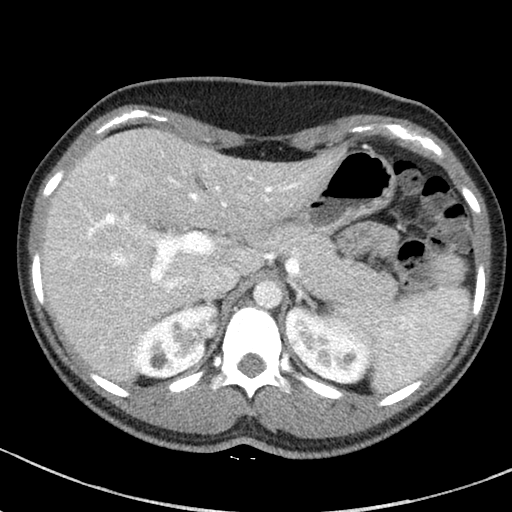
[im 81/92  soft-tissue]
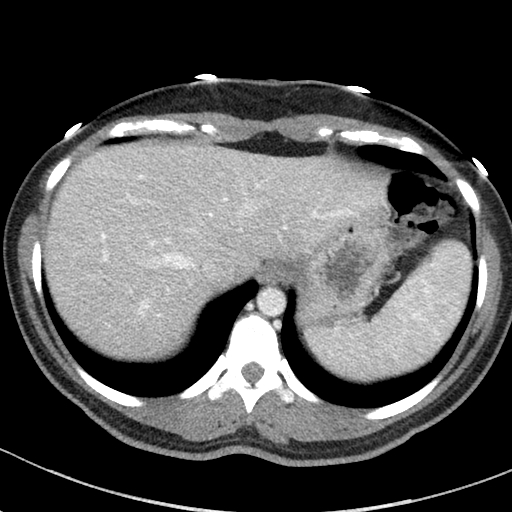
[im 88/92  soft-tissue]
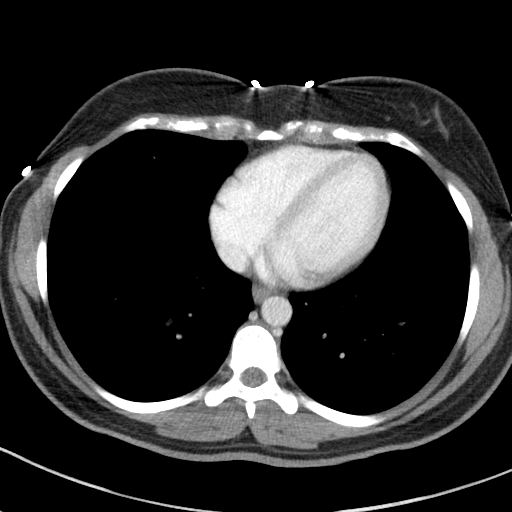

[Series 6: a/p w/ cor · coronal · 0.65mm/px · 3 of 111 slices shown]
[im 37/111  soft-tissue]
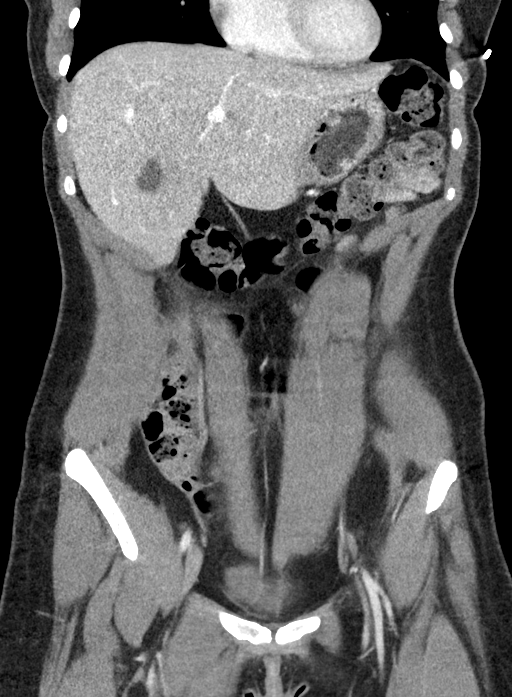
[im 49/111  soft-tissue]
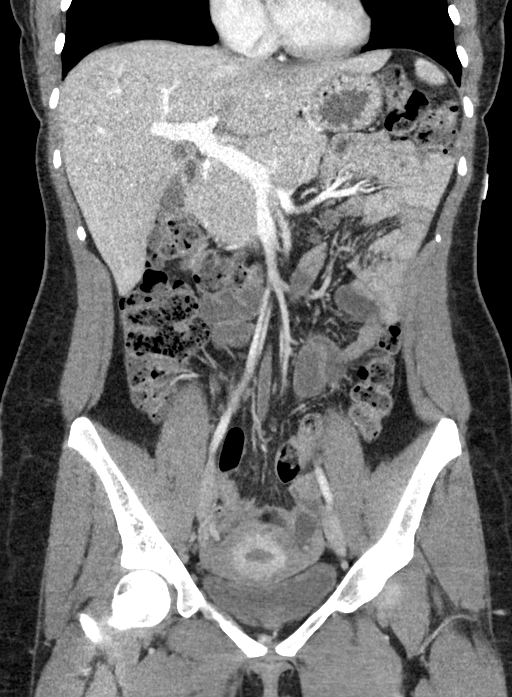
[im 62/111  soft-tissue]
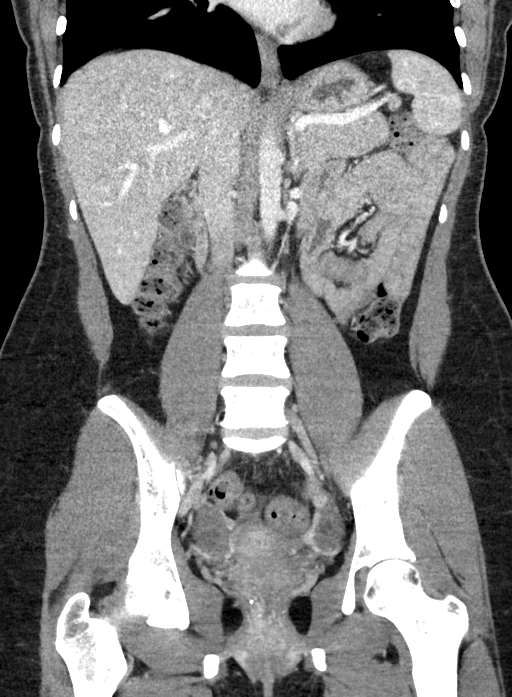

[16 of 46 positions shown; findings below may reference images not displayed]

FINDINGS: Lower chest: No acute abnormality.

Hepatobiliary: Left hepatic lobe granuloma. Unremarkable
gallbladder. No biliary dilatation. No enhancing mass of the liver.

Pancreas: Normal pancreas without ductal dilatation or mass.

Spleen: Normal size spleen without mass.

Adrenals/Urinary Tract: Normal bilateral adrenal glands. Several too
small to characterize subcentimeter water attenuating cysts both
kidneys. No solid enhancing masses are apparent. No obstructive
uropathy. Early pyelogram phase of enhancement of the kidneys. The
urinary bladder is unremarkable.

Stomach/Bowel: Nondistended stomach without mural thickening for
gastric fold prominence. Normal small bowel rotation and ligament of
Treitz position. No small bowel obstruction or inflammation. No
evidence of acute appendicitis. Average amount of fecal retention
within the colon. No bowel obstruction seen.

Vascular/Lymphatic: No significant vascular findings are present. No
enlarged abdominal or pelvic lymph nodes.

Reproductive: Anteverted uterus without focal mass. Physiologic
follicles noted of both ovaries. No adnexal mass is seen.

Other: No abdominal wall hernia or abnormality. No abdominopelvic
ascites.

Musculoskeletal: No acute or significant osseous findings.
IMPRESSION: 1. No acute intraabdominal nor pelvic abnormality.
2. No bowel obstruction or inflammation. Average amount of fecal
retention within the colon.
3. Left hepatic granuloma.
4. Bilateral renal cysts most of which are too small to further
characterize. No obstructive uropathy.

## 2018-08-28 ENCOUNTER — Other Ambulatory Visit (HOSPITAL_COMMUNITY): Payer: BLUE CROSS/BLUE SHIELD | Attending: Psychiatry | Admitting: Licensed Clinical Social Worker

## 2018-08-28 DIAGNOSIS — F329 Major depressive disorder, single episode, unspecified: Secondary | ICD-10-CM | POA: Diagnosis not present

## 2018-08-28 DIAGNOSIS — F419 Anxiety disorder, unspecified: Secondary | ICD-10-CM | POA: Diagnosis not present

## 2018-08-28 DIAGNOSIS — Z79899 Other long term (current) drug therapy: Secondary | ICD-10-CM | POA: Diagnosis not present

## 2018-08-28 DIAGNOSIS — F332 Major depressive disorder, recurrent severe without psychotic features: Secondary | ICD-10-CM

## 2018-08-28 NOTE — Progress Notes (Signed)
    Daily Group Progress Note  Program: IOP  Group Time: 9am-12pm  Participation Level: Active  Behavioral Response: Appropriate, Sharing and Assertive  Type of Therapy:  Group Therapy; psycho-educational group, process group  Summary of Progress:  The purpose of this contact is to utilize CBT and DBT skills in a group setting to increase use of healthy coping skills and decrease intensity of active mental health symptoms.  9am-10:30am Clinician checked in with group members, assessing for SI/HI/psychosis and overall level of functioning. Pharmacist facilitated first part of group, providing psycho-educational information related to classes of prescription medications and their uses. Clients were allowed time to ask questions related to medication and side effects.  10:30am-12pm Clinician introduced the topic of Anger and Anger Management. Clinician facilitated discussion on emotions which can be expressed as anger, how we express anger, and where we learned to express this feeling. Clinician challenged group members to identify times they expressed anger and identify thought and possible other feelings related to that situation. Clinician validated client feelings and inquired about any distorted thoughts related to event. Clinician and group members reviewed previously learned relaxation techniques of 5-7-9 Breathing, Imagery, and Progressive Muscle Relaxation. Clinician provided clients the opportunity to complete mindful stretching activity in session. Clinician checked out with group members requesting clients name a small self care activity they will complete before next group. Client participated in all group discussions and activities. Client reports frustration with her mother not speaking up for herself when her father and aunt speak down to her. Client reports because of this she is sometimes perceived as disrespectful when she [client] tries to stand up for herself. Client states she  learned growing up to not show anger, except her father, which made client often attempt to avoid conflict even as an adult.  Harlon DittyKarissa A Federica Allport, LCSW

## 2018-08-29 ENCOUNTER — Other Ambulatory Visit (HOSPITAL_COMMUNITY): Payer: BLUE CROSS/BLUE SHIELD | Admitting: Licensed Clinical Social Worker

## 2018-08-29 DIAGNOSIS — F32A Depression, unspecified: Secondary | ICD-10-CM

## 2018-08-29 DIAGNOSIS — F419 Anxiety disorder, unspecified: Secondary | ICD-10-CM

## 2018-08-29 DIAGNOSIS — F329 Major depressive disorder, single episode, unspecified: Secondary | ICD-10-CM

## 2018-08-29 DIAGNOSIS — F332 Major depressive disorder, recurrent severe without psychotic features: Secondary | ICD-10-CM

## 2018-08-29 DIAGNOSIS — Z79899 Other long term (current) drug therapy: Secondary | ICD-10-CM | POA: Diagnosis not present

## 2018-08-29 MED ORDER — ESCITALOPRAM OXALATE 10 MG PO TABS: 30.0000 mg | ORAL_TABLET | Freq: Every day | ORAL | 0 refills | Status: DC
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
## 2018-08-29 NOTE — Patient Instructions (Addendum)
D:  Patient completed MH-IOP today.  A:  Follow up with Dr. Lolly MustacheArfeen on 10-10-18 @ 1pm and patient will call Dr. [name] for a follow up appointment. Provided pt with [facility] flyer and recommended it.   RTW on 09-04-18; without any restrictions.  Encouraged support groups.  R:  Patient receptive.

## 2018-08-30 ENCOUNTER — Encounter (HOSPITAL_COMMUNITY): Payer: Self-pay | Admitting: Family

## 2018-08-30 ENCOUNTER — Other Ambulatory Visit (HOSPITAL_COMMUNITY): Payer: BLUE CROSS/BLUE SHIELD

## 2018-08-31 NOTE — Progress Notes (Signed)
    Daily Group Progress Note  Program: IOP  Group Time: 9am-12pm  Participation Level: Active  Behavioral Response: Appropriate  Type of Therapy:  Group Therapy; psycho-educational group, process group  Summary of Progress:  The purpose of this group is to utilize CBT and DBT skills in a group setting to increase utilization of healthy coping skills and decrease intensity of active mental health symptoms.  9am-11am Clinician and group members continued with the topic of Anger. Clinician and group member discussed cycle of anger, and how the cycle is similar for other overwhelming/ uncomfortable emotions. Clinician and group members discussed identifying known triggering events, automatic negative thoughts, and physical symptoms. Clinician requested clients identify body sensations at low, medium, and high intensity of feelings. Clinician reminded clients of CBT triangle and connection between thoughts, feelings, and behaviors. Clinician role played with client using I-statements in response to others who are not respectful of boundaries, which leads to anger. Clinician praised client engagement and validated the skill is not very easily implemented first time. Clinician provided clients with Anger Coping Skills (using diversions, taking a time-out, know your warning signs) including Emotional Regulation Skills Opposite Action and Check the Facts.  11am-12pm Grief and Loss processing group facilitated by Chaplin. Focus of discussion is on identifying types of grief/loss in life and responses to these losses. Chaplin provided some information on mindfulness which will be followed up on in next session.  Client verbalized ongoing concerns related to returning to work while still belching. Client was encouraged to follow up with doctors and outpatient therapy as planned.  Harlon DittyKarissa A Brone, LCSW

## 2018-09-06 DIAGNOSIS — F4311 Post-traumatic stress disorder, acute: Secondary | ICD-10-CM | POA: Diagnosis not present

## 2018-09-11 ENCOUNTER — Encounter: Payer: Self-pay | Admitting: Family Medicine

## 2018-09-11 NOTE — Progress Notes (Signed)
Endoscopy/biopsy:  Biopsies negative for celiac

## 2018-09-13 DIAGNOSIS — F4311 Post-traumatic stress disorder, acute: Secondary | ICD-10-CM | POA: Diagnosis not present

## 2018-09-14 DIAGNOSIS — F3341 Major depressive disorder, recurrent, in partial remission: Secondary | ICD-10-CM | POA: Diagnosis not present

## 2018-09-14 DIAGNOSIS — R142 Eructation: Secondary | ICD-10-CM | POA: Diagnosis not present

## 2018-09-14 DIAGNOSIS — F419 Anxiety disorder, unspecified: Secondary | ICD-10-CM | POA: Diagnosis not present

## 2018-10-05 DIAGNOSIS — F4311 Post-traumatic stress disorder, acute: Secondary | ICD-10-CM | POA: Diagnosis not present

## 2018-10-10 ENCOUNTER — Ambulatory Visit (INDEPENDENT_AMBULATORY_CARE_PROVIDER_SITE_OTHER): Payer: BLUE CROSS/BLUE SHIELD | Admitting: Psychiatry

## 2018-10-10 VITALS — BP 122/70 | HR 68 | Ht 63.0 in | Wt 157.0 lb

## 2018-10-10 DIAGNOSIS — F331 Major depressive disorder, recurrent, moderate: Secondary | ICD-10-CM | POA: Diagnosis not present

## 2018-10-10 DIAGNOSIS — F411 Generalized anxiety disorder: Secondary | ICD-10-CM | POA: Diagnosis not present

## 2018-10-10 DIAGNOSIS — F4310 Post-traumatic stress disorder, unspecified: Secondary | ICD-10-CM | POA: Diagnosis not present

## 2018-10-10 DIAGNOSIS — F4311 Post-traumatic stress disorder, acute: Secondary | ICD-10-CM | POA: Diagnosis not present

## 2018-10-10 MED ORDER — BUSPIRONE HCL 5 MG PO TABS: 5.0000 mg | ORAL_TABLET | Freq: Three times a day (TID) | ORAL | 1 refills | Status: DC

## 2018-10-10 MED ORDER — ESCITALOPRAM OXALATE 20 MG PO TABS: 20.0000 mg | ORAL_TABLET | Freq: Every day | ORAL | 1 refills | Status: DC
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
## 2018-10-12 DIAGNOSIS — M7731 Calcaneal spur, right foot: Secondary | ICD-10-CM | POA: Diagnosis not present

## 2018-10-12 DIAGNOSIS — S93401A Sprain of unspecified ligament of right ankle, initial encounter: Secondary | ICD-10-CM

## 2018-10-12 DIAGNOSIS — M25571 Pain in right ankle and joints of right foot: Secondary | ICD-10-CM | POA: Diagnosis not present

## 2018-10-12 HISTORY — DX: Sprain of unspecified ligament of right ankle, initial encounter: S93.401A

## 2018-10-26 DIAGNOSIS — F4311 Post-traumatic stress disorder, acute: Secondary | ICD-10-CM | POA: Diagnosis not present

## 2018-11-22 ENCOUNTER — Ambulatory Visit (HOSPITAL_COMMUNITY): Payer: BLUE CROSS/BLUE SHIELD | Admitting: Psychiatry

## 2019-01-25 DIAGNOSIS — F419 Anxiety disorder, unspecified: Secondary | ICD-10-CM | POA: Diagnosis not present

## 2019-01-25 DIAGNOSIS — F322 Major depressive disorder, single episode, severe without psychotic features: Secondary | ICD-10-CM | POA: Diagnosis not present

## 2019-01-25 DIAGNOSIS — F411 Generalized anxiety disorder: Secondary | ICD-10-CM | POA: Diagnosis not present

## 2019-01-25 DIAGNOSIS — F4310 Post-traumatic stress disorder, unspecified: Secondary | ICD-10-CM | POA: Diagnosis not present

## 2019-03-01 DIAGNOSIS — N970 Female infertility associated with anovulation: Secondary | ICD-10-CM | POA: Diagnosis not present

## 2019-03-17 ENCOUNTER — Other Ambulatory Visit (HOSPITAL_COMMUNITY): Payer: Self-pay | Admitting: Psychiatry

## 2019-03-17 DIAGNOSIS — F4310 Post-traumatic stress disorder, unspecified: Secondary | ICD-10-CM

## 2019-03-17 DIAGNOSIS — F411 Generalized anxiety disorder: Secondary | ICD-10-CM

## 2019-03-17 DIAGNOSIS — F331 Major depressive disorder, recurrent, moderate: Secondary | ICD-10-CM

## 2019-03-22 DIAGNOSIS — F332 Major depressive disorder, recurrent severe without psychotic features: Secondary | ICD-10-CM | POA: Diagnosis not present

## 2019-03-29 DIAGNOSIS — F331 Major depressive disorder, recurrent, moderate: Secondary | ICD-10-CM | POA: Diagnosis not present

## 2019-03-29 DIAGNOSIS — F4310 Post-traumatic stress disorder, unspecified: Secondary | ICD-10-CM | POA: Diagnosis not present

## 2019-04-05 DIAGNOSIS — F4310 Post-traumatic stress disorder, unspecified: Secondary | ICD-10-CM | POA: Diagnosis not present

## 2019-04-05 DIAGNOSIS — F331 Major depressive disorder, recurrent, moderate: Secondary | ICD-10-CM | POA: Diagnosis not present

## 2019-04-12 DIAGNOSIS — F331 Major depressive disorder, recurrent, moderate: Secondary | ICD-10-CM | POA: Diagnosis not present

## 2019-04-12 DIAGNOSIS — F4310 Post-traumatic stress disorder, unspecified: Secondary | ICD-10-CM | POA: Diagnosis not present

## 2019-04-26 DIAGNOSIS — F4310 Post-traumatic stress disorder, unspecified: Secondary | ICD-10-CM | POA: Diagnosis not present

## 2019-04-26 DIAGNOSIS — F331 Major depressive disorder, recurrent, moderate: Secondary | ICD-10-CM | POA: Diagnosis not present

## 2019-05-01 DIAGNOSIS — F4310 Post-traumatic stress disorder, unspecified: Secondary | ICD-10-CM | POA: Diagnosis not present

## 2019-05-01 DIAGNOSIS — F331 Major depressive disorder, recurrent, moderate: Secondary | ICD-10-CM | POA: Diagnosis not present

## 2019-05-09 DIAGNOSIS — F331 Major depressive disorder, recurrent, moderate: Secondary | ICD-10-CM | POA: Diagnosis not present

## 2019-05-09 DIAGNOSIS — F4310 Post-traumatic stress disorder, unspecified: Secondary | ICD-10-CM | POA: Diagnosis not present

## 2019-05-16 DIAGNOSIS — F331 Major depressive disorder, recurrent, moderate: Secondary | ICD-10-CM | POA: Diagnosis not present

## 2019-05-16 DIAGNOSIS — F4310 Post-traumatic stress disorder, unspecified: Secondary | ICD-10-CM | POA: Diagnosis not present

## 2019-05-17 DIAGNOSIS — F331 Major depressive disorder, recurrent, moderate: Secondary | ICD-10-CM | POA: Diagnosis not present

## 2019-05-17 DIAGNOSIS — F4310 Post-traumatic stress disorder, unspecified: Secondary | ICD-10-CM | POA: Diagnosis not present

## 2019-05-22 ENCOUNTER — Ambulatory Visit (HOSPITAL_COMMUNITY): Payer: BLUE CROSS/BLUE SHIELD | Admitting: Psychiatry

## 2019-05-22 ENCOUNTER — Other Ambulatory Visit: Payer: Self-pay

## 2019-05-25 DIAGNOSIS — F4310 Post-traumatic stress disorder, unspecified: Secondary | ICD-10-CM | POA: Diagnosis not present

## 2019-05-25 DIAGNOSIS — F331 Major depressive disorder, recurrent, moderate: Secondary | ICD-10-CM | POA: Diagnosis not present

## 2019-05-28 ENCOUNTER — Ambulatory Visit (INDEPENDENT_AMBULATORY_CARE_PROVIDER_SITE_OTHER): Payer: BC Managed Care – PPO | Admitting: Psychiatry

## 2019-05-28 ENCOUNTER — Other Ambulatory Visit: Payer: Self-pay

## 2019-05-28 ENCOUNTER — Encounter (HOSPITAL_COMMUNITY): Payer: Self-pay | Admitting: Psychiatry

## 2019-05-28 VITALS — Wt 154.0 lb

## 2019-05-28 DIAGNOSIS — F411 Generalized anxiety disorder: Secondary | ICD-10-CM | POA: Diagnosis not present

## 2019-05-28 DIAGNOSIS — F331 Major depressive disorder, recurrent, moderate: Secondary | ICD-10-CM

## 2019-05-28 DIAGNOSIS — F4310 Post-traumatic stress disorder, unspecified: Secondary | ICD-10-CM | POA: Diagnosis not present

## 2019-05-28 MED ORDER — ESCITALOPRAM OXALATE 20 MG PO TABS: 20.0000 mg | ORAL_TABLET | Freq: Every day | ORAL | 1 refills | Status: DC

## 2019-05-28 MED ORDER — LORAZEPAM 0.5 MG PO TABS: 0.5000 mg | ORAL_TABLET | Freq: Every day | ORAL | 0 refills | Status: DC | PRN

## 2019-05-28 MED ORDER — BUSPIRONE HCL 10 MG PO TABS: 10.0000 mg | ORAL_TABLET | Freq: Three times a day (TID) | ORAL | 1 refills | Status: DC
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
## 2019-05-31 DIAGNOSIS — F4310 Post-traumatic stress disorder, unspecified: Secondary | ICD-10-CM | POA: Diagnosis not present

## 2019-05-31 DIAGNOSIS — F331 Major depressive disorder, recurrent, moderate: Secondary | ICD-10-CM | POA: Diagnosis not present

## 2019-06-07 DIAGNOSIS — F4310 Post-traumatic stress disorder, unspecified: Secondary | ICD-10-CM | POA: Diagnosis not present

## 2019-06-07 DIAGNOSIS — F331 Major depressive disorder, recurrent, moderate: Secondary | ICD-10-CM | POA: Diagnosis not present

## 2019-06-20 DIAGNOSIS — F331 Major depressive disorder, recurrent, moderate: Secondary | ICD-10-CM | POA: Diagnosis not present

## 2019-06-20 DIAGNOSIS — F4310 Post-traumatic stress disorder, unspecified: Secondary | ICD-10-CM | POA: Diagnosis not present

## 2019-06-27 ENCOUNTER — Other Ambulatory Visit: Payer: Self-pay

## 2019-06-27 ENCOUNTER — Encounter (HOSPITAL_COMMUNITY): Payer: BC Managed Care – PPO | Admitting: Psychiatry

## 2019-06-27 DIAGNOSIS — F331 Major depressive disorder, recurrent, moderate: Secondary | ICD-10-CM | POA: Diagnosis not present

## 2019-06-27 DIAGNOSIS — F4310 Post-traumatic stress disorder, unspecified: Secondary | ICD-10-CM | POA: Diagnosis not present

## 2019-06-27 NOTE — Progress Notes (Signed)
No show

## 2019-07-13 DIAGNOSIS — F322 Major depressive disorder, single episode, severe without psychotic features: Secondary | ICD-10-CM | POA: Diagnosis not present

## 2019-07-13 DIAGNOSIS — F411 Generalized anxiety disorder: Secondary | ICD-10-CM | POA: Diagnosis not present

## 2019-07-13 DIAGNOSIS — F331 Major depressive disorder, recurrent, moderate: Secondary | ICD-10-CM | POA: Diagnosis not present

## 2019-07-13 DIAGNOSIS — R1030 Lower abdominal pain, unspecified: Secondary | ICD-10-CM | POA: Diagnosis not present

## 2019-07-13 DIAGNOSIS — F4310 Post-traumatic stress disorder, unspecified: Secondary | ICD-10-CM | POA: Diagnosis not present

## 2019-07-13 DIAGNOSIS — M5430 Sciatica, unspecified side: Secondary | ICD-10-CM | POA: Diagnosis not present

## 2019-07-13 DIAGNOSIS — Z113 Encounter for screening for infections with a predominantly sexual mode of transmission: Secondary | ICD-10-CM | POA: Diagnosis not present

## 2019-07-19 ENCOUNTER — Encounter (HOSPITAL_COMMUNITY): Payer: Self-pay | Admitting: Psychiatry

## 2019-07-19 ENCOUNTER — Other Ambulatory Visit: Payer: Self-pay

## 2019-07-19 ENCOUNTER — Ambulatory Visit (INDEPENDENT_AMBULATORY_CARE_PROVIDER_SITE_OTHER): Payer: BC Managed Care – PPO | Admitting: Psychiatry

## 2019-07-19 DIAGNOSIS — F410 Panic disorder [episodic paroxysmal anxiety] without agoraphobia: Secondary | ICD-10-CM

## 2019-07-19 DIAGNOSIS — F331 Major depressive disorder, recurrent, moderate: Secondary | ICD-10-CM

## 2019-07-19 DIAGNOSIS — F411 Generalized anxiety disorder: Secondary | ICD-10-CM | POA: Diagnosis not present

## 2019-07-19 DIAGNOSIS — F4310 Post-traumatic stress disorder, unspecified: Secondary | ICD-10-CM | POA: Diagnosis not present

## 2019-07-19 MED ORDER — ESCITALOPRAM OXALATE 20 MG PO TABS: 20.0000 mg | ORAL_TABLET | Freq: Every day | ORAL | 1 refills | Status: DC

## 2019-07-19 MED ORDER — BUSPIRONE HCL 10 MG PO TABS: 10.0000 mg | ORAL_TABLET | Freq: Three times a day (TID) | ORAL | 1 refills | Status: DC
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
## 2019-07-20 DIAGNOSIS — F4310 Post-traumatic stress disorder, unspecified: Secondary | ICD-10-CM | POA: Diagnosis not present

## 2019-07-20 DIAGNOSIS — F331 Major depressive disorder, recurrent, moderate: Secondary | ICD-10-CM | POA: Diagnosis not present

## 2019-07-23 DIAGNOSIS — F322 Major depressive disorder, single episode, severe without psychotic features: Secondary | ICD-10-CM | POA: Diagnosis not present

## 2019-07-23 DIAGNOSIS — Z0000 Encounter for general adult medical examination without abnormal findings: Secondary | ICD-10-CM | POA: Diagnosis not present

## 2019-07-23 DIAGNOSIS — Z13220 Encounter for screening for lipoid disorders: Secondary | ICD-10-CM | POA: Diagnosis not present

## 2019-07-23 DIAGNOSIS — R5383 Other fatigue: Secondary | ICD-10-CM | POA: Diagnosis not present

## 2019-07-23 DIAGNOSIS — F411 Generalized anxiety disorder: Secondary | ICD-10-CM | POA: Diagnosis not present

## 2019-07-25 DIAGNOSIS — F4310 Post-traumatic stress disorder, unspecified: Secondary | ICD-10-CM | POA: Diagnosis not present

## 2019-07-25 DIAGNOSIS — F331 Major depressive disorder, recurrent, moderate: Secondary | ICD-10-CM | POA: Diagnosis not present

## 2019-08-30 DIAGNOSIS — F331 Major depressive disorder, recurrent, moderate: Secondary | ICD-10-CM | POA: Diagnosis not present

## 2019-08-30 DIAGNOSIS — F4310 Post-traumatic stress disorder, unspecified: Secondary | ICD-10-CM | POA: Diagnosis not present

## 2019-08-30 DIAGNOSIS — T1491XA Suicide attempt, initial encounter: Secondary | ICD-10-CM | POA: Diagnosis not present

## 2019-08-30 DIAGNOSIS — F29 Unspecified psychosis not due to a substance or known physiological condition: Secondary | ICD-10-CM | POA: Diagnosis not present

## 2019-08-30 DIAGNOSIS — R5383 Other fatigue: Secondary | ICD-10-CM | POA: Diagnosis not present

## 2019-08-30 DIAGNOSIS — Z79899 Other long term (current) drug therapy: Secondary | ICD-10-CM | POA: Diagnosis not present

## 2019-08-30 DIAGNOSIS — F419 Anxiety disorder, unspecified: Secondary | ICD-10-CM | POA: Diagnosis not present

## 2019-08-30 DIAGNOSIS — T50992A Poisoning by other drugs, medicaments and biological substances, intentional self-harm, initial encounter: Secondary | ICD-10-CM | POA: Diagnosis not present

## 2019-08-30 DIAGNOSIS — F329 Major depressive disorder, single episode, unspecified: Secondary | ICD-10-CM | POA: Diagnosis not present

## 2019-08-31 DIAGNOSIS — F411 Generalized anxiety disorder: Secondary | ICD-10-CM | POA: Diagnosis not present

## 2019-08-31 DIAGNOSIS — F4310 Post-traumatic stress disorder, unspecified: Secondary | ICD-10-CM | POA: Diagnosis not present

## 2019-08-31 DIAGNOSIS — F322 Major depressive disorder, single episode, severe without psychotic features: Secondary | ICD-10-CM | POA: Diagnosis not present

## 2019-09-04 DIAGNOSIS — F331 Major depressive disorder, recurrent, moderate: Secondary | ICD-10-CM | POA: Diagnosis not present

## 2019-09-04 DIAGNOSIS — F4310 Post-traumatic stress disorder, unspecified: Secondary | ICD-10-CM | POA: Diagnosis not present

## 2019-09-10 ENCOUNTER — Other Ambulatory Visit: Payer: Self-pay

## 2019-09-10 ENCOUNTER — Encounter (HOSPITAL_COMMUNITY): Payer: Self-pay | Admitting: Psychiatry

## 2019-09-10 ENCOUNTER — Ambulatory Visit (INDEPENDENT_AMBULATORY_CARE_PROVIDER_SITE_OTHER): Payer: BC Managed Care – PPO | Admitting: Psychiatry

## 2019-09-10 DIAGNOSIS — F4310 Post-traumatic stress disorder, unspecified: Secondary | ICD-10-CM

## 2019-09-10 DIAGNOSIS — F410 Panic disorder [episodic paroxysmal anxiety] without agoraphobia: Secondary | ICD-10-CM | POA: Diagnosis not present

## 2019-09-10 DIAGNOSIS — F331 Major depressive disorder, recurrent, moderate: Secondary | ICD-10-CM | POA: Diagnosis not present

## 2019-09-10 DIAGNOSIS — F411 Generalized anxiety disorder: Secondary | ICD-10-CM | POA: Diagnosis not present

## 2019-09-10 MED ORDER — ESCITALOPRAM OXALATE 20 MG PO TABS: 20.0000 mg | ORAL_TABLET | Freq: Every day | ORAL | 1 refills | Status: DC

## 2019-09-10 MED ORDER — QUETIAPINE FUMARATE 50 MG PO TABS: ORAL_TABLET | ORAL | 1 refills | Status: DC

## 2019-09-10 MED ORDER — BUSPIRONE HCL 10 MG PO TABS: 10.0000 mg | ORAL_TABLET | Freq: Three times a day (TID) | ORAL | 1 refills | Status: DC
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
## 2019-09-17 DIAGNOSIS — F331 Major depressive disorder, recurrent, moderate: Secondary | ICD-10-CM | POA: Diagnosis not present

## 2019-09-17 DIAGNOSIS — F4310 Post-traumatic stress disorder, unspecified: Secondary | ICD-10-CM | POA: Diagnosis not present

## 2019-10-12 ENCOUNTER — Ambulatory Visit (HOSPITAL_COMMUNITY): Payer: BC Managed Care – PPO | Admitting: Psychiatry

## 2019-12-27 ENCOUNTER — Telehealth (HOSPITAL_COMMUNITY): Payer: Self-pay | Admitting: Psychiatry

## 2019-12-27 ENCOUNTER — Ambulatory Visit (INDEPENDENT_AMBULATORY_CARE_PROVIDER_SITE_OTHER): Payer: 59 | Admitting: Psychiatry

## 2019-12-27 ENCOUNTER — Other Ambulatory Visit: Payer: Self-pay

## 2019-12-27 ENCOUNTER — Encounter (HOSPITAL_COMMUNITY): Payer: Self-pay | Admitting: Psychiatry

## 2019-12-27 DIAGNOSIS — F4310 Post-traumatic stress disorder, unspecified: Secondary | ICD-10-CM | POA: Diagnosis not present

## 2019-12-27 DIAGNOSIS — F331 Major depressive disorder, recurrent, moderate: Secondary | ICD-10-CM | POA: Diagnosis not present

## 2019-12-27 DIAGNOSIS — F411 Generalized anxiety disorder: Secondary | ICD-10-CM | POA: Diagnosis not present

## 2019-12-27 MED ORDER — QUETIAPINE FUMARATE 50 MG PO TABS: ORAL_TABLET | ORAL | 1 refills | Status: DC

## 2019-12-27 MED ORDER — BUPROPION HCL ER (XL) 150 MG PO TB24: 150.0000 mg | ORAL_TABLET | Freq: Every day | ORAL | 1 refills | Status: DC
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
## 2020-01-07 ENCOUNTER — Telehealth (HOSPITAL_COMMUNITY): Payer: Self-pay

## 2020-01-07 NOTE — Telephone Encounter (Signed)
Pt left message regarding symptoms of sleepiness and lethargy that she is relating to her medications. This writer attempted to call pt back. Pt did not answer, unable to leave VM as mailbox is full.

## 2020-01-08 ENCOUNTER — Telehealth (HOSPITAL_COMMUNITY): Payer: Self-pay

## 2020-01-08 ENCOUNTER — Telehealth (HOSPITAL_COMMUNITY): Payer: Self-pay | Admitting: *Deleted

## 2020-01-08 NOTE — Telephone Encounter (Signed)
Pt called again expressing anxiety regarding her medications. Pt says the Wellbutrin is causing her to be "hyper" and not sleeping for two days. So she says the Seroquel 25mg  doesn't help so she took a whole tablet, 50 mg, and slept for two days. Pt stated that her anxiety is increased and she is experiencing sweating, hypervigilance. Writer reinforced med ed on the Wellbutrin and Seroquel. Pt repeated that Lexapro and Buspar worked much better. Pt is awaiting a call back today as she has to work tomorrow and is anxious about sleep cycle. Please review.

## 2020-01-08 NOTE — Telephone Encounter (Signed)
Left VM for pt regarding message left 4/12. Instructed pt call back to discuss her concerns.

## 2020-01-08 NOTE — Telephone Encounter (Signed)
I returned patient's phone call.  She did not pick up the phone I left her message.  If she like to go back on Lexapro and BuSpar then discontinue Wellbutrin and call Lexapro and BuSpar.  She had decided on her visit that she is not consistent with BuSpar and felt the Lexapro was not working so we had discontinued.  We have discussed that she should see a therapist and I do believe she need to see therapist for coping skills.

## 2020-01-14 ENCOUNTER — Telehealth (HOSPITAL_COMMUNITY): Payer: Self-pay

## 2020-01-14 NOTE — Telephone Encounter (Signed)
Medication management - Message left for patient as she requested to speak to this Clinical Manager.  Requested patient call back if she wanted to discuss anything with services or treatment and phone number provided.

## 2020-01-15 ENCOUNTER — Ambulatory Visit (HOSPITAL_COMMUNITY): Payer: Self-pay | Admitting: Psychiatry

## 2020-01-15 DIAGNOSIS — F4310 Post-traumatic stress disorder, unspecified: Secondary | ICD-10-CM | POA: Diagnosis not present

## 2020-01-15 DIAGNOSIS — F411 Generalized anxiety disorder: Secondary | ICD-10-CM | POA: Diagnosis not present

## 2020-01-15 DIAGNOSIS — F331 Major depressive disorder, recurrent, moderate: Secondary | ICD-10-CM | POA: Diagnosis not present

## 2020-01-21 DIAGNOSIS — F4321 Adjustment disorder with depressed mood: Secondary | ICD-10-CM | POA: Diagnosis not present

## 2020-01-24 ENCOUNTER — Encounter (HOSPITAL_COMMUNITY): Payer: Self-pay | Admitting: Psychiatry

## 2020-01-24 ENCOUNTER — Telehealth (HOSPITAL_COMMUNITY): Payer: Self-pay | Admitting: Psychiatry

## 2020-01-24 ENCOUNTER — Other Ambulatory Visit: Payer: Self-pay

## 2020-01-25 DIAGNOSIS — F331 Major depressive disorder, recurrent, moderate: Secondary | ICD-10-CM | POA: Diagnosis not present

## 2020-01-28 DIAGNOSIS — F4321 Adjustment disorder with depressed mood: Secondary | ICD-10-CM | POA: Diagnosis not present

## 2020-02-04 DIAGNOSIS — F4321 Adjustment disorder with depressed mood: Secondary | ICD-10-CM | POA: Diagnosis not present

## 2020-02-13 DIAGNOSIS — G4719 Other hypersomnia: Secondary | ICD-10-CM | POA: Diagnosis not present

## 2020-02-13 DIAGNOSIS — R5383 Other fatigue: Secondary | ICD-10-CM | POA: Diagnosis not present

## 2020-02-13 DIAGNOSIS — F4310 Post-traumatic stress disorder, unspecified: Secondary | ICD-10-CM | POA: Diagnosis not present

## 2020-02-13 DIAGNOSIS — F331 Major depressive disorder, recurrent, moderate: Secondary | ICD-10-CM | POA: Diagnosis not present

## 2020-02-13 DIAGNOSIS — F411 Generalized anxiety disorder: Secondary | ICD-10-CM | POA: Diagnosis not present

## 2020-02-14 DIAGNOSIS — F4321 Adjustment disorder with depressed mood: Secondary | ICD-10-CM | POA: Diagnosis not present

## 2020-02-21 DIAGNOSIS — F902 Attention-deficit hyperactivity disorder, combined type: Secondary | ICD-10-CM | POA: Diagnosis not present

## 2020-02-21 DIAGNOSIS — G4710 Hypersomnia, unspecified: Secondary | ICD-10-CM | POA: Diagnosis not present

## 2020-02-21 DIAGNOSIS — F4321 Adjustment disorder with depressed mood: Secondary | ICD-10-CM | POA: Diagnosis not present

## 2020-02-21 DIAGNOSIS — F332 Major depressive disorder, recurrent severe without psychotic features: Secondary | ICD-10-CM | POA: Diagnosis not present

## 2020-02-21 DIAGNOSIS — F411 Generalized anxiety disorder: Secondary | ICD-10-CM | POA: Diagnosis not present

## 2020-02-21 DIAGNOSIS — F4310 Post-traumatic stress disorder, unspecified: Secondary | ICD-10-CM | POA: Diagnosis not present

## 2020-02-21 DIAGNOSIS — R0683 Snoring: Secondary | ICD-10-CM | POA: Diagnosis not present

## 2020-02-21 MED FILL — ADDERALL XR 10 MG CAP SA: 10 | 30 days supply | Qty: 30 | Fill #0

## 2020-02-21 MED FILL — buPROPion HCL ER (XL) 150 M: 150 | 10 days supply | Qty: 10 | Fill #0

## 2020-02-21 MED FILL — DESVENLAFAXINE SUC ER 25 MG: 25 | 30 days supply | Qty: 30 | Fill #0

## 2020-02-21 MED FILL — busPIRone HCL 15 MG TABS: 15 | 30 days supply | Qty: 60 | Fill #0

## 2020-02-21 MED FILL — DESVENLAFAXINE SUC ER 50 MG: 50 | 30 days supply | Qty: 30 | Fill #0

## 2020-02-29 DIAGNOSIS — F411 Generalized anxiety disorder: Secondary | ICD-10-CM | POA: Diagnosis not present

## 2020-02-29 DIAGNOSIS — F332 Major depressive disorder, recurrent severe without psychotic features: Secondary | ICD-10-CM | POA: Diagnosis not present

## 2020-02-29 DIAGNOSIS — F902 Attention-deficit hyperactivity disorder, combined type: Secondary | ICD-10-CM | POA: Diagnosis not present

## 2020-02-29 DIAGNOSIS — G4710 Hypersomnia, unspecified: Secondary | ICD-10-CM | POA: Diagnosis not present

## 2020-03-18 MED FILL — ADDERALL XR 20 MG CAP SA: 20 | 30 days supply | Qty: 30 | Fill #0

## 2020-03-19 DIAGNOSIS — F4321 Adjustment disorder with depressed mood: Secondary | ICD-10-CM | POA: Diagnosis not present

## 2020-03-20 ENCOUNTER — Encounter: Payer: Self-pay | Admitting: Neurology

## 2020-03-20 ENCOUNTER — Ambulatory Visit (INDEPENDENT_AMBULATORY_CARE_PROVIDER_SITE_OTHER): Payer: 59 | Admitting: Neurology

## 2020-03-20 VITALS — BP 115/81 | HR 80 | Ht 63.0 in | Wt 158.0 lb

## 2020-03-20 DIAGNOSIS — R400 Somnolence: Secondary | ICD-10-CM | POA: Diagnosis not present

## 2020-03-20 DIAGNOSIS — G479 Sleep disorder, unspecified: Secondary | ICD-10-CM | POA: Diagnosis not present

## 2020-03-20 DIAGNOSIS — R519 Headache, unspecified: Secondary | ICD-10-CM

## 2020-03-20 DIAGNOSIS — R4586 Emotional lability: Secondary | ICD-10-CM
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
## 2020-03-20 NOTE — Patient Instructions (Addendum)
Thank you for choosing [facility] for your sleep related care! It was nice to meet you today! I appreciate that you entrust me with your sleep related healthcare concerns. I hope, I was able to address at least some of your concerns today, and that I can help you feel reassured and also get better.    Here is what we discussed today and what we came up with as our plan for you:   Your history is not telltale for narcolepsy, nevertheless, it is not impossible that you have a underlying sleepiness disorder such as narcolepsy or what we call idiopathic hypersomnolence.  As explained, in order to proceed with extended sleep testing you would have to taper off your psychotropic medications including the BuSpar, Pristiq and Adderall XR.  You were just recently started on new medication and are in the process of titrating these.  I agree with you that you should continue with your medication regimen for now and we will proceed with a nighttime sleep study only which would allow you to continue your current medication regimen.  Please don't drive or operate heavy machinery when feeling sleepy.  Based on your symptoms and your exam I believe you are at some risk for obstructive sleep apnea (aka OSA), and I think we should proceed with a sleep study to determine whether you do or do not have OSA and how severe it is. Even, if you have mild OSA, I may want you to consider treatment with CPAP, as treatment of even borderline or mild sleep apnea can result and improvement of symptoms such as sleep disruption, daytime sleepiness, nighttime bathroom breaks, restless leg symptoms, improvement of headache syndromes, even improved mood disorder.   Please remember, the long-term risks and ramifications of untreated moderate to severe obstructive sleep apnea are: increased Cardiovascular disease, including congestive heart failure, stroke, difficult to control hypertension, treatment resistant obesity,  arrhythmias, especially irregular heartbeat commonly known as A. Fib. (atrial fibrillation); even type 2 diabetes has been linked to untreated OSA.   Sleep apnea can cause disruption of sleep and sleep deprivation in most cases, which, in turn, can cause recurrent headaches, problems with memory, mood, concentration, focus, and vigilance. Most people with untreated sleep apnea report excessive daytime sleepiness, which can affect their ability to drive. Please do not drive if you feel sleepy. Patients with sleep apnea developed difficulty initiating and maintaining sleep (aka insomnia).   Having sleep apnea may increase your risk for other sleep disorders, including involuntary behaviors sleep such as sleep terrors, sleep talking, sleepwalking.    Having sleep apnea can also increase your risk for restless leg syndrome and leg movements at night.   Please note that untreated obstructive sleep apnea may carry additional perioperative morbidity. Patients with significant obstructive sleep apnea (typically, in the moderate to severe degree) should receive, if possible, perioperative PAP (positive airway pressure) therapy and the surgeons and particularly the anesthesiologists should be informed of the diagnosis and the severity of the sleep disordered breathing.   I will likely see you back after your sleep study to go over the test results and where to go from there. We will call you after your sleep study to advise about the results (most likely, you will hear from [facility], my nurse) and to set up an appointment at the time, as necessary.    Our sleep lab administrative assistant will call you to schedule your sleep study and give you further instructions, regarding the check in process  for the sleep study, arrival time, what to bring, when you can expect to leave after the study, etc., and to answer any other logistical questions you may have. If you don't hear back from her by about 2 weeks from now,  please feel free to call her direct line at [phone] or you can call our general clinic number, or email [place] through My Chart.

## 2020-03-25 ENCOUNTER — Telehealth: Payer: Self-pay

## 2020-03-25 NOTE — Telephone Encounter (Signed)
LVM for pt to call me back to schedule sleep study  

## 2020-04-08 ENCOUNTER — Telehealth: Payer: Self-pay

## 2020-04-08 NOTE — Telephone Encounter (Signed)
LVM for pt to call me back to schedule sleep study  

## 2020-04-11 MED FILL — busPIRone HCL 15 MG TABS: 15 | 30 days supply | Qty: 60 | Fill #1

## 2020-04-12 MED FILL — buPROPion HCL ER (XL) 150 M: 150 | 10 days supply | Qty: 10 | Fill #0

## 2020-04-12 MED FILL — DESVENLAFAXINE SUC ER 50 MG: 50 | 30 days supply | Qty: 30 | Fill #0

## 2020-04-14 ENCOUNTER — Telehealth: Payer: Self-pay

## 2020-04-14 NOTE — Telephone Encounter (Signed)
We have attempted to call the patient two times to schedule sleep study.  Patient has been unavailable at the phone numbers we have on file and has not returned our calls. If patient calls back we will schedule them for their sleep study.  

## 2020-04-18 MED FILL — ADDERALL XR 20 MG CAP SA: 20 | 30 days supply | Qty: 30 | Fill #0

## 2020-04-22 DIAGNOSIS — T887XXA Unspecified adverse effect of drug or medicament, initial encounter: Secondary | ICD-10-CM | POA: Diagnosis not present

## 2020-04-22 DIAGNOSIS — F902 Attention-deficit hyperactivity disorder, combined type: Secondary | ICD-10-CM | POA: Diagnosis not present

## 2020-04-22 DIAGNOSIS — F332 Major depressive disorder, recurrent severe without psychotic features: Secondary | ICD-10-CM | POA: Diagnosis not present

## 2020-04-22 DIAGNOSIS — F4310 Post-traumatic stress disorder, unspecified: Secondary | ICD-10-CM | POA: Diagnosis not present

## 2020-04-22 DIAGNOSIS — Z79899 Other long term (current) drug therapy: Secondary | ICD-10-CM | POA: Diagnosis not present

## 2020-04-22 DIAGNOSIS — F411 Generalized anxiety disorder: Secondary | ICD-10-CM | POA: Diagnosis not present

## 2020-04-30 MED FILL — ADDERALL XR 20 MG CAP SA: 20 | 30 days supply | Qty: 30 | Fill #0

## 2020-05-01 DIAGNOSIS — F4321 Adjustment disorder with depressed mood: Secondary | ICD-10-CM | POA: Diagnosis not present

## 2020-05-20 DIAGNOSIS — F4321 Adjustment disorder with depressed mood: Secondary | ICD-10-CM | POA: Diagnosis not present

## 2020-06-06 MED FILL — ADDERALL XR 20 MG CAP SA: 20 | 30 days supply | Qty: 30 | Fill #0

## 2020-06-09 MED FILL — busPIRone HCL 15 MG TABS: 15 | 30 days supply | Qty: 60 | Fill #0

## 2020-06-09 MED FILL — DESVENLAFAXINE SUC ER 50 MG: 50 | 30 days supply | Qty: 30 | Fill #0

## 2020-06-25 DIAGNOSIS — F4321 Adjustment disorder with depressed mood: Secondary | ICD-10-CM | POA: Diagnosis not present

## 2020-07-03 DIAGNOSIS — F4321 Adjustment disorder with depressed mood: Secondary | ICD-10-CM | POA: Diagnosis not present

## 2020-07-24 ENCOUNTER — Other Ambulatory Visit (HOSPITAL_COMMUNITY): Payer: Self-pay | Admitting: Physician Assistant

## 2020-07-24 DIAGNOSIS — F4310 Post-traumatic stress disorder, unspecified: Secondary | ICD-10-CM | POA: Diagnosis not present

## 2020-07-24 DIAGNOSIS — F411 Generalized anxiety disorder: Secondary | ICD-10-CM | POA: Diagnosis not present

## 2020-07-24 DIAGNOSIS — F4321 Adjustment disorder with depressed mood: Secondary | ICD-10-CM | POA: Diagnosis not present

## 2020-07-24 DIAGNOSIS — F902 Attention-deficit hyperactivity disorder, combined type: Secondary | ICD-10-CM | POA: Diagnosis not present

## 2020-07-24 DIAGNOSIS — F332 Major depressive disorder, recurrent severe without psychotic features: Secondary | ICD-10-CM | POA: Diagnosis not present

## 2020-07-24 MED FILL — DESVENLAFAXINE SUC ER 100 M: 100 | 90 days supply | Qty: 90 | Fill #0

## 2020-07-24 MED FILL — busPIRone HCL 15 MG TABS: 15 | 45 days supply | Qty: 180 | Fill #0

## 2020-07-24 MED FILL — ADDERALL XR 20 MG CAP SA: 20 | 30 days supply | Qty: 30 | Fill #0

## 2020-09-24 ENCOUNTER — Other Ambulatory Visit (HOSPITAL_COMMUNITY): Payer: Self-pay | Admitting: Physician Assistant

## 2020-09-24 DIAGNOSIS — F902 Attention-deficit hyperactivity disorder, combined type: Secondary | ICD-10-CM | POA: Diagnosis not present

## 2020-09-24 DIAGNOSIS — F4310 Post-traumatic stress disorder, unspecified: Secondary | ICD-10-CM | POA: Diagnosis not present

## 2020-09-24 DIAGNOSIS — F411 Generalized anxiety disorder: Secondary | ICD-10-CM | POA: Diagnosis not present

## 2020-09-24 DIAGNOSIS — F332 Major depressive disorder, recurrent severe without psychotic features: Secondary | ICD-10-CM | POA: Diagnosis not present

## 2020-09-24 MED FILL — ADDERALL XR 20 MG CAP SA: 20 | 30 days supply | Qty: 30 | Fill #0

## 2020-09-30 DIAGNOSIS — G8911 Acute pain due to trauma: Secondary | ICD-10-CM | POA: Diagnosis not present

## 2020-09-30 DIAGNOSIS — R52 Pain, unspecified: Secondary | ICD-10-CM | POA: Diagnosis not present

## 2020-09-30 DIAGNOSIS — M25562 Pain in left knee: Secondary | ICD-10-CM | POA: Diagnosis not present

## 2020-09-30 DIAGNOSIS — R079 Chest pain, unspecified: Secondary | ICD-10-CM | POA: Diagnosis not present

## 2020-09-30 DIAGNOSIS — S80919A Unspecified superficial injury of unspecified knee, initial encounter: Secondary | ICD-10-CM | POA: Diagnosis not present

## 2020-09-30 DIAGNOSIS — Z79899 Other long term (current) drug therapy: Secondary | ICD-10-CM | POA: Diagnosis not present

## 2020-09-30 DIAGNOSIS — F419 Anxiety disorder, unspecified: Secondary | ICD-10-CM | POA: Diagnosis not present

## 2020-09-30 DIAGNOSIS — R0789 Other chest pain: Secondary | ICD-10-CM | POA: Diagnosis not present

## 2020-10-03 MED FILL — busPIRone HCL 15 MG TABS: 15 | 45 days supply | Qty: 180 | Fill #0

## 2020-10-08 DIAGNOSIS — F330 Major depressive disorder, recurrent, mild: Secondary | ICD-10-CM | POA: Diagnosis not present

## 2020-10-21 ENCOUNTER — Other Ambulatory Visit: Payer: Self-pay | Admitting: *Deleted

## 2020-11-10 DIAGNOSIS — F330 Major depressive disorder, recurrent, mild: Secondary | ICD-10-CM | POA: Diagnosis not present

## 2020-11-20 ENCOUNTER — Other Ambulatory Visit (HOSPITAL_COMMUNITY): Payer: Self-pay | Admitting: Physician Assistant

## 2020-11-20 DIAGNOSIS — F332 Major depressive disorder, recurrent severe without psychotic features: Secondary | ICD-10-CM | POA: Diagnosis not present

## 2020-11-20 DIAGNOSIS — F902 Attention-deficit hyperactivity disorder, combined type: Secondary | ICD-10-CM | POA: Diagnosis not present

## 2020-11-20 DIAGNOSIS — F4310 Post-traumatic stress disorder, unspecified: Secondary | ICD-10-CM | POA: Diagnosis not present

## 2020-11-20 DIAGNOSIS — F411 Generalized anxiety disorder: Secondary | ICD-10-CM | POA: Diagnosis not present

## 2020-11-20 DIAGNOSIS — Z209 Contact with and (suspected) exposure to unspecified communicable disease: Secondary | ICD-10-CM | POA: Diagnosis not present

## 2020-11-20 MED FILL — ADDERALL XR 20 MG CAP SA: 20 | 30 days supply | Qty: 30 | Fill #0

## 2020-11-20 MED FILL — DESVENLAFAXINE SUC ER 100 M: 100 | 90 days supply | Qty: 90 | Fill #0

## 2020-11-20 MED FILL — busPIRone HCL 15 MG TABS: 15 | 90 days supply | Qty: 360 | Fill #0

## 2020-11-20 MED FILL — DESVENLAFAXINE SUC ER 50 MG: 50 | 90 days supply | Qty: 90 | Fill #0

## 2020-11-24 DIAGNOSIS — Z209 Contact with and (suspected) exposure to unspecified communicable disease: Secondary | ICD-10-CM | POA: Diagnosis not present

## 2020-11-27 ENCOUNTER — Other Ambulatory Visit (HOSPITAL_COMMUNITY): Payer: Self-pay | Admitting: Physician Assistant

## 2020-12-02 MED FILL — AZITHROMYCIN 500 MG TABLET: 500 | 1 days supply | Qty: 2 | Fill #0

## 2020-12-03 DIAGNOSIS — F330 Major depressive disorder, recurrent, mild: Secondary | ICD-10-CM | POA: Diagnosis not present

## 2020-12-17 DIAGNOSIS — F330 Major depressive disorder, recurrent, mild: Secondary | ICD-10-CM | POA: Diagnosis not present

## 2021-01-15 DIAGNOSIS — F902 Attention-deficit hyperactivity disorder, combined type: Secondary | ICD-10-CM | POA: Diagnosis not present

## 2021-01-15 DIAGNOSIS — R002 Palpitations: Secondary | ICD-10-CM | POA: Diagnosis not present

## 2021-01-15 DIAGNOSIS — F4310 Post-traumatic stress disorder, unspecified: Secondary | ICD-10-CM | POA: Diagnosis not present

## 2021-01-15 DIAGNOSIS — R768 Other specified abnormal immunological findings in serum: Secondary | ICD-10-CM | POA: Diagnosis not present

## 2021-01-15 DIAGNOSIS — F411 Generalized anxiety disorder: Secondary | ICD-10-CM | POA: Diagnosis not present

## 2021-01-15 DIAGNOSIS — F332 Major depressive disorder, recurrent severe without psychotic features: Secondary | ICD-10-CM | POA: Diagnosis not present

## 2021-01-16 ENCOUNTER — Other Ambulatory Visit (HOSPITAL_COMMUNITY): Payer: Self-pay

## 2021-01-16 MED ORDER — PROPRANOLOL HCL ER 60 MG PO CP24
60.0000 mg | ORAL_CAPSULE | Freq: Every day | ORAL | 1 refills | Status: DC
  Filled 2021-01-16: qty 30, 30d supply, fill #0
  Filled 2021-02-18: qty 30, 30d supply, fill #1

## 2021-01-16 MED ORDER — ADDERALL XR 20 MG PO CP24
20.0000 mg | ORAL_CAPSULE | Freq: Every morning | ORAL | 0 refills | Status: DC
  Filled 2021-01-16: qty 30, 30d supply, fill #0

## 2021-01-16 MED ORDER — AMPHETAMINE-DEXTROAMPHET ER 20 MG PO CP24
20.0000 mg | ORAL_CAPSULE | Freq: Every morning | ORAL | 0 refills | Status: DC
  Filled 2021-02-18: qty 30, 30d supply, fill #0

## 2021-02-12 DIAGNOSIS — F330 Major depressive disorder, recurrent, mild: Secondary | ICD-10-CM | POA: Diagnosis not present

## 2021-02-18 ENCOUNTER — Other Ambulatory Visit (HOSPITAL_COMMUNITY): Payer: Self-pay

## 2021-02-19 ENCOUNTER — Other Ambulatory Visit (HOSPITAL_COMMUNITY): Payer: Self-pay

## 2021-03-05 ENCOUNTER — Other Ambulatory Visit (HOSPITAL_COMMUNITY): Payer: Self-pay

## 2021-03-05 DIAGNOSIS — F902 Attention-deficit hyperactivity disorder, combined type: Secondary | ICD-10-CM | POA: Diagnosis not present

## 2021-03-05 DIAGNOSIS — F411 Generalized anxiety disorder: Secondary | ICD-10-CM | POA: Diagnosis not present

## 2021-03-05 DIAGNOSIS — F332 Major depressive disorder, recurrent severe without psychotic features: Secondary | ICD-10-CM | POA: Diagnosis not present

## 2021-03-05 DIAGNOSIS — F4310 Post-traumatic stress disorder, unspecified: Secondary | ICD-10-CM | POA: Diagnosis not present

## 2021-03-05 MED ORDER — AMPHETAMINE-DEXTROAMPHET ER 25 MG PO CP24
25.0000 mg | ORAL_CAPSULE | Freq: Every morning | ORAL | 0 refills | Status: DC
  Filled 2021-03-05 – 2021-04-06 (×2): qty 30, 30d supply, fill #0

## 2021-03-11 ENCOUNTER — Other Ambulatory Visit (HOSPITAL_COMMUNITY): Payer: Self-pay

## 2021-03-16 ENCOUNTER — Other Ambulatory Visit (HOSPITAL_COMMUNITY): Payer: Self-pay

## 2021-03-23 DIAGNOSIS — F330 Major depressive disorder, recurrent, mild: Secondary | ICD-10-CM | POA: Diagnosis not present

## 2021-04-01 DIAGNOSIS — F330 Major depressive disorder, recurrent, mild: Secondary | ICD-10-CM | POA: Diagnosis not present

## 2021-04-03 ENCOUNTER — Other Ambulatory Visit (HOSPITAL_COMMUNITY): Payer: Self-pay

## 2021-04-03 DIAGNOSIS — L989 Disorder of the skin and subcutaneous tissue, unspecified: Secondary | ICD-10-CM | POA: Diagnosis not present

## 2021-04-03 DIAGNOSIS — Z7689 Persons encountering health services in other specified circumstances: Secondary | ICD-10-CM | POA: Diagnosis not present

## 2021-04-03 DIAGNOSIS — E7800 Pure hypercholesterolemia, unspecified: Secondary | ICD-10-CM | POA: Diagnosis not present

## 2021-04-03 DIAGNOSIS — Z8619 Personal history of other infectious and parasitic diseases: Secondary | ICD-10-CM | POA: Diagnosis not present

## 2021-04-03 DIAGNOSIS — F411 Generalized anxiety disorder: Secondary | ICD-10-CM | POA: Diagnosis not present

## 2021-04-03 DIAGNOSIS — R768 Other specified abnormal immunological findings in serum: Secondary | ICD-10-CM | POA: Diagnosis not present

## 2021-04-03 DIAGNOSIS — F4310 Post-traumatic stress disorder, unspecified: Secondary | ICD-10-CM | POA: Diagnosis not present

## 2021-04-03 DIAGNOSIS — F332 Major depressive disorder, recurrent severe without psychotic features: Secondary | ICD-10-CM | POA: Diagnosis not present

## 2021-04-03 DIAGNOSIS — F902 Attention-deficit hyperactivity disorder, combined type: Secondary | ICD-10-CM | POA: Diagnosis not present

## 2021-04-03 MED ORDER — AMPHETAMINE-DEXTROAMPHET ER 25 MG PO CP24
25.0000 mg | ORAL_CAPSULE | Freq: Every morning | ORAL | 0 refills | Status: DC
  Filled 2021-05-21: qty 30, 30d supply, fill #0

## 2021-04-06 ENCOUNTER — Other Ambulatory Visit (HOSPITAL_COMMUNITY): Payer: Self-pay

## 2021-04-08 ENCOUNTER — Other Ambulatory Visit (HOSPITAL_COMMUNITY): Payer: Self-pay

## 2021-04-09 ENCOUNTER — Other Ambulatory Visit (HOSPITAL_COMMUNITY): Payer: Self-pay

## 2021-04-10 ENCOUNTER — Other Ambulatory Visit (HOSPITAL_COMMUNITY): Payer: Self-pay

## 2021-04-20 DIAGNOSIS — F330 Major depressive disorder, recurrent, mild: Secondary | ICD-10-CM | POA: Diagnosis not present

## 2021-04-21 ENCOUNTER — Other Ambulatory Visit (HOSPITAL_COMMUNITY): Payer: Self-pay

## 2021-04-27 ENCOUNTER — Other Ambulatory Visit (HOSPITAL_COMMUNITY): Payer: Self-pay

## 2021-04-27 MED ORDER — DESVENLAFAXINE SUCCINATE ER 50 MG PO TB24
ORAL_TABLET | ORAL | 0 refills | Status: DC
  Filled 2021-04-27 – 2021-05-21 (×2): qty 90, 90d supply, fill #0

## 2021-04-27 MED ORDER — DESVENLAFAXINE SUCCINATE ER 100 MG PO TB24
ORAL_TABLET | ORAL | 0 refills | Status: DC
  Filled 2021-04-27 – 2021-05-21 (×2): qty 90, 90d supply, fill #0

## 2021-05-05 ENCOUNTER — Other Ambulatory Visit (HOSPITAL_COMMUNITY): Payer: Self-pay

## 2021-05-21 ENCOUNTER — Other Ambulatory Visit (HOSPITAL_COMMUNITY): Payer: Self-pay

## 2021-05-22 ENCOUNTER — Other Ambulatory Visit (HOSPITAL_COMMUNITY): Payer: Self-pay

## 2021-05-22 MED ORDER — BUSPIRONE HCL 15 MG PO TABS
30.0000 mg | ORAL_TABLET | Freq: Two times a day (BID) | ORAL | 0 refills | Status: DC
  Filled 2021-05-22: qty 360, 90d supply, fill #0

## 2021-06-04 ENCOUNTER — Other Ambulatory Visit (HOSPITAL_COMMUNITY): Payer: Self-pay

## 2021-06-04 DIAGNOSIS — Z130 Encounter for screening for diseases of the blood and blood-forming organs and certain disorders involving the immune mechanism: Secondary | ICD-10-CM | POA: Diagnosis not present

## 2021-06-04 DIAGNOSIS — Z13228 Encounter for screening for other metabolic disorders: Secondary | ICD-10-CM | POA: Diagnosis not present

## 2021-06-04 DIAGNOSIS — E782 Mixed hyperlipidemia: Secondary | ICD-10-CM | POA: Diagnosis not present

## 2021-06-04 DIAGNOSIS — Z1389 Encounter for screening for other disorder: Secondary | ICD-10-CM | POA: Diagnosis not present

## 2021-06-04 DIAGNOSIS — Z1329 Encounter for screening for other suspected endocrine disorder: Secondary | ICD-10-CM | POA: Diagnosis not present

## 2021-06-04 DIAGNOSIS — R768 Other specified abnormal immunological findings in serum: Secondary | ICD-10-CM | POA: Diagnosis not present

## 2021-06-04 DIAGNOSIS — F902 Attention-deficit hyperactivity disorder, combined type: Secondary | ICD-10-CM | POA: Diagnosis not present

## 2021-06-04 DIAGNOSIS — F411 Generalized anxiety disorder: Secondary | ICD-10-CM | POA: Diagnosis not present

## 2021-06-04 DIAGNOSIS — E882 Lipomatosis, not elsewhere classified: Secondary | ICD-10-CM | POA: Diagnosis not present

## 2021-06-04 DIAGNOSIS — R222 Localized swelling, mass and lump, trunk: Secondary | ICD-10-CM | POA: Diagnosis not present

## 2021-06-04 DIAGNOSIS — Z113 Encounter for screening for infections with a predominantly sexual mode of transmission: Secondary | ICD-10-CM | POA: Diagnosis not present

## 2021-06-04 DIAGNOSIS — Z01419 Encounter for gynecological examination (general) (routine) without abnormal findings: Secondary | ICD-10-CM | POA: Diagnosis not present

## 2021-06-04 DIAGNOSIS — F332 Major depressive disorder, recurrent severe without psychotic features: Secondary | ICD-10-CM | POA: Diagnosis not present

## 2021-06-04 DIAGNOSIS — F4310 Post-traumatic stress disorder, unspecified: Secondary | ICD-10-CM | POA: Diagnosis not present

## 2021-06-04 DIAGNOSIS — Z0000 Encounter for general adult medical examination without abnormal findings: Secondary | ICD-10-CM | POA: Diagnosis not present

## 2021-06-04 MED ORDER — BUSPIRONE HCL 15 MG PO TABS
30.0000 mg | ORAL_TABLET | Freq: Two times a day (BID) | ORAL | 0 refills | Status: DC
  Filled 2021-06-04: qty 360, 90d supply, fill #0

## 2021-06-04 MED ORDER — DESVENLAFAXINE SUCCINATE ER 100 MG PO TB24
100.0000 mg | ORAL_TABLET | Freq: Every day | ORAL | 0 refills | Status: DC
  Filled 2021-06-04: qty 90, 90d supply, fill #0

## 2021-06-04 MED ORDER — AMPHETAMINE-DEXTROAMPHET ER 25 MG PO CP24
25.0000 mg | ORAL_CAPSULE | Freq: Every morning | ORAL | 0 refills | Status: DC
  Filled 2021-09-11: qty 30, 30d supply, fill #0

## 2021-06-04 MED ORDER — CYCLOBENZAPRINE HCL 5 MG PO TABS
5.0000 mg | ORAL_TABLET | Freq: Three times a day (TID) | ORAL | 0 refills | Status: DC | PRN
  Filled 2021-06-04: qty 30, 10d supply, fill #0

## 2021-06-04 MED ORDER — AMPHETAMINE-DEXTROAMPHET ER 25 MG PO CP24: 25.0000 mg | ORAL_CAPSULE | Freq: Every morning | ORAL | 0 refills | Status: DC

## 2021-06-04 MED ORDER — PROPRANOLOL HCL 10 MG PO TABS
10.0000 mg | ORAL_TABLET | Freq: Three times a day (TID) | ORAL | 0 refills | Status: DC
  Filled 2021-06-04: qty 30, 5d supply, fill #0

## 2021-06-04 MED ORDER — DESVENLAFAXINE SUCCINATE ER 50 MG PO TB24
50.0000 mg | ORAL_TABLET | Freq: Every day | ORAL | 0 refills | Status: DC
  Filled 2021-06-04: qty 90, 90d supply, fill #0

## 2021-06-04 MED ORDER — AMPHETAMINE-DEXTROAMPHET ER 25 MG PO CP24
25.0000 mg | ORAL_CAPSULE | Freq: Every morning | ORAL | 0 refills | Status: DC
  Filled 2021-06-04 – 2021-08-12 (×2): qty 30, 30d supply, fill #0

## 2021-06-15 ENCOUNTER — Other Ambulatory Visit (HOSPITAL_COMMUNITY): Payer: Self-pay

## 2021-07-02 DIAGNOSIS — F330 Major depressive disorder, recurrent, mild: Secondary | ICD-10-CM | POA: Diagnosis not present

## 2021-07-27 DIAGNOSIS — F330 Major depressive disorder, recurrent, mild: Secondary | ICD-10-CM | POA: Diagnosis not present

## 2021-07-30 ENCOUNTER — Other Ambulatory Visit (HOSPITAL_COMMUNITY): Payer: Self-pay

## 2021-07-30 DIAGNOSIS — F4310 Post-traumatic stress disorder, unspecified: Secondary | ICD-10-CM | POA: Diagnosis not present

## 2021-07-30 DIAGNOSIS — F332 Major depressive disorder, recurrent severe without psychotic features: Secondary | ICD-10-CM | POA: Diagnosis not present

## 2021-07-30 DIAGNOSIS — F411 Generalized anxiety disorder: Secondary | ICD-10-CM | POA: Diagnosis not present

## 2021-07-30 DIAGNOSIS — F902 Attention-deficit hyperactivity disorder, combined type: Secondary | ICD-10-CM | POA: Diagnosis not present

## 2021-07-30 MED ORDER — ESCITALOPRAM OXALATE 10 MG PO TABS
10.0000 mg | ORAL_TABLET | Freq: Every day | ORAL | 1 refills | Status: DC
  Filled 2021-07-30 – 2021-08-12 (×2): qty 30, 30d supply, fill #0

## 2021-08-07 ENCOUNTER — Other Ambulatory Visit (HOSPITAL_COMMUNITY): Payer: Self-pay

## 2021-08-12 ENCOUNTER — Other Ambulatory Visit (HOSPITAL_COMMUNITY): Payer: Self-pay

## 2021-09-08 ENCOUNTER — Other Ambulatory Visit (HOSPITAL_COMMUNITY): Payer: Self-pay

## 2021-09-08 DIAGNOSIS — F411 Generalized anxiety disorder: Secondary | ICD-10-CM | POA: Diagnosis not present

## 2021-09-08 DIAGNOSIS — F332 Major depressive disorder, recurrent severe without psychotic features: Secondary | ICD-10-CM | POA: Diagnosis not present

## 2021-09-08 DIAGNOSIS — F4310 Post-traumatic stress disorder, unspecified: Secondary | ICD-10-CM | POA: Diagnosis not present

## 2021-09-08 MED ORDER — ESCITALOPRAM OXALATE 20 MG PO TABS
20.0000 mg | ORAL_TABLET | Freq: Every day | ORAL | 0 refills | Status: DC
  Filled 2021-09-08: qty 90, 90d supply, fill #0

## 2021-09-11 ENCOUNTER — Other Ambulatory Visit (HOSPITAL_COMMUNITY): Payer: Self-pay

## 2021-10-15 ENCOUNTER — Other Ambulatory Visit (HOSPITAL_COMMUNITY): Payer: Self-pay

## 2021-10-15 DIAGNOSIS — F411 Generalized anxiety disorder: Secondary | ICD-10-CM | POA: Diagnosis not present

## 2021-10-15 DIAGNOSIS — F902 Attention-deficit hyperactivity disorder, combined type: Secondary | ICD-10-CM | POA: Diagnosis not present

## 2021-10-15 DIAGNOSIS — F4310 Post-traumatic stress disorder, unspecified: Secondary | ICD-10-CM | POA: Diagnosis not present

## 2021-10-15 DIAGNOSIS — F332 Major depressive disorder, recurrent severe without psychotic features: Secondary | ICD-10-CM | POA: Diagnosis not present

## 2021-10-15 MED ORDER — ESCITALOPRAM OXALATE 20 MG PO TABS
30.0000 mg | ORAL_TABLET | Freq: Every day | ORAL | 0 refills | Status: DC
  Filled 2021-10-15 – 2022-07-21 (×2): qty 135, 90d supply, fill #0

## 2021-10-15 MED ORDER — PROPRANOLOL HCL ER 60 MG PO CP24
60.0000 mg | ORAL_CAPSULE | Freq: Every day | ORAL | 0 refills | Status: DC
  Filled 2021-10-15 – 2021-11-02 (×2): qty 30, 30d supply, fill #0

## 2021-10-15 MED ORDER — AMPHETAMINE-DEXTROAMPHET ER 25 MG PO CP24
25.0000 mg | ORAL_CAPSULE | Freq: Every morning | ORAL | 0 refills | Status: DC
  Filled 2021-10-15 – 2021-11-02 (×2): qty 30, 30d supply, fill #0

## 2021-10-27 ENCOUNTER — Other Ambulatory Visit (HOSPITAL_COMMUNITY): Payer: Self-pay

## 2021-11-02 ENCOUNTER — Other Ambulatory Visit (HOSPITAL_COMMUNITY): Payer: Self-pay

## 2021-12-07 ENCOUNTER — Other Ambulatory Visit (HOSPITAL_COMMUNITY): Payer: Self-pay

## 2021-12-07 DIAGNOSIS — F4310 Post-traumatic stress disorder, unspecified: Secondary | ICD-10-CM | POA: Diagnosis not present

## 2021-12-07 DIAGNOSIS — F4312 Post-traumatic stress disorder, chronic: Secondary | ICD-10-CM | POA: Diagnosis not present

## 2021-12-07 DIAGNOSIS — F515 Nightmare disorder: Secondary | ICD-10-CM | POA: Diagnosis not present

## 2021-12-07 DIAGNOSIS — F411 Generalized anxiety disorder: Secondary | ICD-10-CM | POA: Diagnosis not present

## 2021-12-07 DIAGNOSIS — F332 Major depressive disorder, recurrent severe without psychotic features: Secondary | ICD-10-CM | POA: Diagnosis not present

## 2021-12-07 DIAGNOSIS — F902 Attention-deficit hyperactivity disorder, combined type: Secondary | ICD-10-CM | POA: Diagnosis not present

## 2021-12-07 MED ORDER — AMPHETAMINE-DEXTROAMPHET ER 25 MG PO CP24
25.0000 mg | ORAL_CAPSULE | Freq: Every morning | ORAL | 0 refills | Status: DC
  Filled 2021-12-07: qty 30, 30d supply, fill #0

## 2021-12-07 MED ORDER — PROPRANOLOL HCL ER 60 MG PO CP24
60.0000 mg | ORAL_CAPSULE | Freq: Every day | ORAL | 0 refills | Status: DC
  Filled 2021-12-07: qty 30, 30d supply, fill #0

## 2021-12-07 MED ORDER — BUSPIRONE HCL 30 MG PO TABS
30.0000 mg | ORAL_TABLET | Freq: Two times a day (BID) | ORAL | 0 refills | Status: DC
  Filled 2021-12-07: qty 180, 90d supply, fill #0

## 2021-12-07 MED ORDER — PRAZOSIN HCL 1 MG PO CAPS
1.0000 mg | ORAL_CAPSULE | Freq: Every day | ORAL | 0 refills | Status: DC
Start: 2021-12-07 — End: 2023-02-14
  Filled 2021-12-07: qty 90, 30d supply, fill #0

## 2021-12-07 MED ORDER — ESCITALOPRAM OXALATE 20 MG PO TABS
30.0000 mg | ORAL_TABLET | Freq: Every day | ORAL | 0 refills | Status: DC
  Filled 2021-12-07: qty 135, 90d supply, fill #0

## 2021-12-29 DIAGNOSIS — Z20822 Contact with and (suspected) exposure to covid-19: Secondary | ICD-10-CM | POA: Diagnosis not present

## 2021-12-29 DIAGNOSIS — J069 Acute upper respiratory infection, unspecified: Secondary | ICD-10-CM | POA: Diagnosis not present

## 2021-12-29 DIAGNOSIS — R0981 Nasal congestion: Secondary | ICD-10-CM | POA: Diagnosis not present

## 2022-02-23 DIAGNOSIS — Z20822 Contact with and (suspected) exposure to covid-19: Secondary | ICD-10-CM | POA: Diagnosis not present

## 2022-02-23 DIAGNOSIS — J029 Acute pharyngitis, unspecified: Secondary | ICD-10-CM | POA: Diagnosis not present

## 2022-03-01 DIAGNOSIS — B9689 Other specified bacterial agents as the cause of diseases classified elsewhere: Secondary | ICD-10-CM | POA: Diagnosis not present

## 2022-03-01 DIAGNOSIS — J019 Acute sinusitis, unspecified: Secondary | ICD-10-CM | POA: Diagnosis not present

## 2022-03-01 DIAGNOSIS — J069 Acute upper respiratory infection, unspecified: Secondary | ICD-10-CM | POA: Diagnosis not present

## 2022-03-09 ENCOUNTER — Other Ambulatory Visit (HOSPITAL_COMMUNITY): Payer: Self-pay

## 2022-03-09 MED ORDER — BUSPIRONE HCL 30 MG PO TABS
30.0000 mg | ORAL_TABLET | Freq: Two times a day (BID) | ORAL | 0 refills | Status: DC
  Filled 2022-03-09 – 2022-04-15 (×2): qty 180, 90d supply, fill #0

## 2022-03-09 MED ORDER — PROPRANOLOL HCL ER 60 MG PO CP24
60.0000 mg | ORAL_CAPSULE | Freq: Every day | ORAL | 0 refills | Status: DC
  Filled 2022-03-09: qty 30, 30d supply, fill #0

## 2022-03-09 MED ORDER — ESCITALOPRAM OXALATE 20 MG PO TABS
30.0000 mg | ORAL_TABLET | Freq: Every day | ORAL | 0 refills | Status: DC
  Filled 2022-03-09 – 2022-04-15 (×2): qty 135, 90d supply, fill #0

## 2022-03-10 ENCOUNTER — Other Ambulatory Visit (HOSPITAL_COMMUNITY): Payer: Self-pay

## 2022-03-10 MED ORDER — AMPHETAMINE-DEXTROAMPHET ER 25 MG PO CP24
25.0000 mg | ORAL_CAPSULE | Freq: Every morning | ORAL | 0 refills | Status: DC
  Filled 2022-03-10 – 2022-04-15 (×2): qty 30, 30d supply, fill #0

## 2022-03-19 ENCOUNTER — Other Ambulatory Visit (HOSPITAL_COMMUNITY): Payer: Self-pay

## 2022-03-23 ENCOUNTER — Other Ambulatory Visit (HOSPITAL_COMMUNITY): Payer: Self-pay

## 2022-04-15 ENCOUNTER — Other Ambulatory Visit (HOSPITAL_COMMUNITY): Payer: Self-pay

## 2022-07-21 ENCOUNTER — Other Ambulatory Visit (HOSPITAL_COMMUNITY): Payer: Self-pay

## 2022-07-22 ENCOUNTER — Other Ambulatory Visit (HOSPITAL_COMMUNITY): Payer: Self-pay

## 2022-07-22 MED ORDER — AMPHETAMINE-DEXTROAMPHET ER 25 MG PO CP24
25.0000 mg | ORAL_CAPSULE | Freq: Every morning | ORAL | 0 refills | Status: DC
  Filled 2022-07-22 – 2022-08-12 (×2): qty 30, 30d supply, fill #0

## 2022-07-22 MED ORDER — ESCITALOPRAM OXALATE 20 MG PO TABS
30.0000 mg | ORAL_TABLET | Freq: Every day | ORAL | 0 refills | Status: DC
Start: 2022-07-22 — End: 2023-02-14
  Filled 2022-07-22: qty 135, 90d supply, fill #0

## 2022-07-23 ENCOUNTER — Other Ambulatory Visit (HOSPITAL_COMMUNITY): Payer: Self-pay

## 2022-08-06 ENCOUNTER — Other Ambulatory Visit (HOSPITAL_COMMUNITY): Payer: Self-pay

## 2022-08-12 ENCOUNTER — Other Ambulatory Visit (HOSPITAL_COMMUNITY): Payer: Self-pay

## 2022-08-17 ENCOUNTER — Other Ambulatory Visit (HOSPITAL_COMMUNITY): Payer: Self-pay

## 2022-08-17 DIAGNOSIS — G479 Sleep disorder, unspecified: Secondary | ICD-10-CM | POA: Diagnosis not present

## 2022-08-17 DIAGNOSIS — L84 Corns and callosities: Secondary | ICD-10-CM | POA: Diagnosis not present

## 2022-08-17 DIAGNOSIS — F411 Generalized anxiety disorder: Secondary | ICD-10-CM | POA: Diagnosis not present

## 2022-08-17 DIAGNOSIS — F330 Major depressive disorder, recurrent, mild: Secondary | ICD-10-CM | POA: Diagnosis not present

## 2022-08-17 DIAGNOSIS — F902 Attention-deficit hyperactivity disorder, combined type: Secondary | ICD-10-CM | POA: Diagnosis not present

## 2022-08-17 MED ORDER — HYDROXYZINE HCL 50 MG PO TABS
50.0000 mg | ORAL_TABLET | Freq: Every evening | ORAL | 1 refills | Status: DC | PRN
  Filled 2022-08-17: qty 90, 45d supply, fill #0

## 2022-10-08 ENCOUNTER — Other Ambulatory Visit (HOSPITAL_COMMUNITY): Payer: Self-pay

## 2022-10-08 DIAGNOSIS — M2041 Other hammer toe(s) (acquired), right foot: Secondary | ICD-10-CM | POA: Diagnosis not present

## 2022-10-08 DIAGNOSIS — L851 Acquired keratosis [keratoderma] palmaris et plantaris: Secondary | ICD-10-CM | POA: Diagnosis not present

## 2022-10-08 DIAGNOSIS — G5793 Unspecified mononeuropathy of bilateral lower limbs: Secondary | ICD-10-CM | POA: Diagnosis not present

## 2022-10-08 DIAGNOSIS — M2042 Other hammer toe(s) (acquired), left foot: Secondary | ICD-10-CM | POA: Diagnosis not present

## 2022-10-12 ENCOUNTER — Other Ambulatory Visit (HOSPITAL_COMMUNITY): Payer: Self-pay

## 2022-10-14 ENCOUNTER — Other Ambulatory Visit (HOSPITAL_COMMUNITY): Payer: Self-pay

## 2022-10-14 MED ORDER — AMPHETAMINE-DEXTROAMPHET ER 25 MG PO CP24
25.0000 mg | ORAL_CAPSULE | Freq: Every morning | ORAL | 0 refills | Status: DC
  Filled 2022-10-14: qty 30, 30d supply, fill #0

## 2022-10-18 ENCOUNTER — Other Ambulatory Visit (HOSPITAL_COMMUNITY): Payer: Self-pay

## 2022-10-19 ENCOUNTER — Other Ambulatory Visit (HOSPITAL_COMMUNITY): Payer: Self-pay

## 2022-10-19 MED ORDER — AMPHETAMINE-DEXTROAMPHET ER 25 MG PO CP24
25.0000 mg | ORAL_CAPSULE | Freq: Every morning | ORAL | 0 refills | Status: DC
  Filled 2022-10-19 – 2023-02-02 (×2): qty 30, 30d supply, fill #0

## 2022-11-19 ENCOUNTER — Other Ambulatory Visit (HOSPITAL_COMMUNITY): Payer: Self-pay

## 2022-11-19 MED ORDER — IBUPROFEN 800 MG PO TABS
800.0000 mg | ORAL_TABLET | Freq: Three times a day (TID) | ORAL | 0 refills | Status: DC | PRN
  Filled 2022-11-19: qty 60, 20d supply, fill #0

## 2022-11-19 MED ORDER — DICLOFENAC SODIUM 1 % EX GEL
2.0000 g | Freq: Four times a day (QID) | CUTANEOUS | 5 refills | Status: DC
  Filled 2022-11-19: qty 100, 10d supply, fill #0

## 2022-12-17 ENCOUNTER — Other Ambulatory Visit (HOSPITAL_COMMUNITY): Payer: Self-pay

## 2022-12-17 MED ORDER — AMPHETAMINE-DEXTROAMPHET ER 25 MG PO CP24
25.0000 mg | ORAL_CAPSULE | Freq: Every morning | ORAL | 0 refills | Status: DC
  Filled 2022-12-17: qty 30, 30d supply, fill #0

## 2022-12-19 ENCOUNTER — Other Ambulatory Visit (HOSPITAL_COMMUNITY): Payer: Self-pay

## 2022-12-21 ENCOUNTER — Other Ambulatory Visit (HOSPITAL_COMMUNITY): Payer: Self-pay

## 2023-01-30 ENCOUNTER — Telehealth: Payer: Self-pay

## 2023-02-02 ENCOUNTER — Other Ambulatory Visit (HOSPITAL_COMMUNITY): Payer: Self-pay

## 2023-02-04 ENCOUNTER — Other Ambulatory Visit (HOSPITAL_COMMUNITY): Payer: Self-pay

## 2023-02-07 ENCOUNTER — Other Ambulatory Visit (HOSPITAL_COMMUNITY): Payer: Self-pay

## 2023-02-07 MED ORDER — AMPHETAMINE-DEXTROAMPHET ER 25 MG PO CP24
25.0000 mg | ORAL_CAPSULE | Freq: Every morning | ORAL | 0 refills | Status: DC
  Filled 2023-02-07: qty 30, 30d supply, fill #0

## 2023-02-09 ENCOUNTER — Other Ambulatory Visit (HOSPITAL_COMMUNITY): Payer: Self-pay

## 2023-02-14 ENCOUNTER — Telehealth: Payer: Self-pay | Admitting: Emergency Medicine

## 2023-02-14 ENCOUNTER — Inpatient Hospital Stay (HOSPITAL_COMMUNITY): Payer: Commercial Managed Care - PPO

## 2023-02-14 ENCOUNTER — Encounter (HOSPITAL_COMMUNITY): Payer: Self-pay | Admitting: Obstetrics and Gynecology

## 2023-02-14 ENCOUNTER — Inpatient Hospital Stay (HOSPITAL_COMMUNITY)
Admission: AD | Admit: 2023-02-14 | Discharge: 2023-02-14 | Disposition: A | Discharge status: Home / Self Care | Payer: Commercial Managed Care - PPO | Attending: Obstetrics and Gynecology | Admitting: Obstetrics and Gynecology

## 2023-02-14 ENCOUNTER — Ambulatory Visit: Payer: Self-pay

## 2023-02-14 ENCOUNTER — Telehealth: Payer: Self-pay

## 2023-02-14 DIAGNOSIS — K219 Gastro-esophageal reflux disease without esophagitis: Secondary | ICD-10-CM | POA: Diagnosis not present

## 2023-02-14 DIAGNOSIS — O26891 Other specified pregnancy related conditions, first trimester: Secondary | ICD-10-CM | POA: Insufficient documentation

## 2023-02-14 DIAGNOSIS — Z3A01 Less than 8 weeks gestation of pregnancy: Secondary | ICD-10-CM | POA: Diagnosis not present

## 2023-02-14 DIAGNOSIS — R1010 Upper abdominal pain, unspecified: Secondary | ICD-10-CM | POA: Insufficient documentation

## 2023-02-14 DIAGNOSIS — O09521 Supervision of elderly multigravida, first trimester: Secondary | ICD-10-CM | POA: Diagnosis not present

## 2023-02-14 DIAGNOSIS — Z3491 Encounter for supervision of normal pregnancy, unspecified, first trimester: Secondary | ICD-10-CM

## 2023-02-14 DIAGNOSIS — O99611 Diseases of the digestive system complicating pregnancy, first trimester: Secondary | ICD-10-CM | POA: Diagnosis not present

## 2023-02-14 DIAGNOSIS — R1030 Lower abdominal pain, unspecified: Secondary | ICD-10-CM | POA: Diagnosis not present

## 2023-02-14 DIAGNOSIS — O210 Mild hyperemesis gravidarum: Secondary | ICD-10-CM | POA: Diagnosis not present

## 2023-02-14 HISTORY — DX: Gastro-esophageal reflux disease without esophagitis: K21.9

## 2023-02-14 HISTORY — DX: Anxiety disorder, unspecified: F41.9

## 2023-02-14 LAB — URINALYSIS, ROUTINE W REFLEX MICROSCOPIC
Bilirubin Urine: NEGATIVE
Glucose, UA: NEGATIVE mg/dL
Hgb urine dipstick: NEGATIVE
Ketones, ur: NEGATIVE mg/dL
Leukocytes,Ua: NEGATIVE
Nitrite: NEGATIVE
Protein, ur: NEGATIVE mg/dL
Specific Gravity, Urine: 1.016 (ref 1.005–1.030)
pH: 6 (ref 5.0–8.0)

## 2023-02-14 LAB — COMPREHENSIVE METABOLIC PANEL
ALT: 22 U/L (ref 0–44)
AST: 22 U/L (ref 15–41)
Albumin: 4 g/dL (ref 3.5–5.0)
Alkaline Phosphatase: 50 U/L (ref 38–126)
Anion gap: 8 (ref 5–15)
BUN: 7 mg/dL (ref 6–20)
CO2: 22 mmol/L (ref 22–32)
Calcium: 8.9 mg/dL (ref 8.9–10.3)
Chloride: 102 mmol/L (ref 98–111)
Creatinine, Ser: 0.76 mg/dL (ref 0.44–1.00)
GFR, Estimated: >60 mL/min (ref >60)
Glucose, Bld: 86 mg/dL (ref 70–99)
Potassium: 3.7 mmol/L (ref 3.5–5.1)
Sodium: 132 mmol/L — ABNORMAL LOW (ref 135–145)
Total Bilirubin: 1 mg/dL (ref 0.3–1.2)
Total Protein: 7.3 g/dL (ref 6.5–8.1)

## 2023-02-14 LAB — CBC
HCT: 39 % (ref 36.0–46.0)
Hemoglobin: 12.7 g/dL (ref 12.0–15.0)
MCH: 25.2 pg — ABNORMAL LOW (ref 26.0–34.0)
MCHC: 32.6 g/dL (ref 30.0–36.0)
MCV: 77.5 fL — ABNORMAL LOW (ref 80.0–100.0)
Platelets: 228 10*3/uL (ref 150–400)
RBC: 5.03 MIL/uL (ref 3.87–5.11)
RDW: 13.3 % (ref 11.5–15.5)
WBC: 9.6 10*3/uL (ref 4.0–10.5)
nRBC: 0 % (ref 0.0–0.2)

## 2023-02-14 LAB — HCG, QUANTITATIVE, PREGNANCY: hCG, Beta Chain, Quant, S: 33006 m[IU]/mL — ABNORMAL HIGH (ref <5)

## 2023-02-14 LAB — POCT PREGNANCY, URINE: Preg Test, Ur: POSITIVE — AB

## 2023-02-14 LAB — WET PREP, GENITAL
Sperm: NONE SEEN
Trich, Wet Prep: NONE SEEN
WBC, Wet Prep HPF POC: <10 (ref <10)
Yeast Wet Prep HPF POC: NONE SEEN

## 2023-02-14 LAB — LIPASE, BLOOD: Lipase: 50 U/L (ref 11–51)

## 2023-02-14 MED ORDER — METOCLOPRAMIDE HCL 10 MG PO TABS: 10.0000 mg | ORAL_TABLET | Freq: Four times a day (QID) | ORAL | 1 refills | Status: DC | PRN

## 2023-02-14 MED ORDER — METOCLOPRAMIDE HCL 10 MG PO TABS
10.0000 mg | ORAL_TABLET | Freq: Once | ORAL | Status: AC
  Administered 2023-02-14: 10 mg via ORAL
  Filled 2023-02-14: qty 1

## 2023-02-14 MED ORDER — SCOPOLAMINE 1 MG/3DAYS TD PT72: 1.0000 | MEDICATED_PATCH | TRANSDERMAL | 1 refills | Status: DC

## 2023-02-14 MED ORDER — SCOPOLAMINE 1 MG/3DAYS TD PT72
1.0000 | MEDICATED_PATCH | TRANSDERMAL | Status: DC
  Administered 2023-02-14: 1.5 mg via TRANSDERMAL
  Filled 2023-02-14: qty 1

## 2023-02-14 MED ORDER — FAMOTIDINE 20 MG PO TABS: 20.0000 mg | ORAL_TABLET | Freq: Every day | ORAL | 1 refills | Status: DC

## 2023-02-14 MED ORDER — PROMETHAZINE HCL 25 MG PO TABS: 12.5000 mg | ORAL_TABLET | Freq: Four times a day (QID) | ORAL | 1 refills | Status: DC | PRN

## 2023-02-14 MED ORDER — FAMOTIDINE 20 MG PO TABS
20.0000 mg | ORAL_TABLET | Freq: Every day | ORAL | Status: DC
  Administered 2023-02-14: 20 mg via ORAL
  Filled 2023-02-14: qty 1
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
## 2023-02-14 NOTE — Discharge Instructions (Signed)

## 2023-02-14 NOTE — MAU Note (Addendum)
...  Adriana Dawson is a 37 y.o. at Unknown here in MAU reporting: Requesting prescriptions for nausea and vomiting. She reports her nausea has been ongoing for 4-5 days and she has been belching constantly. She reports she has also been experiencing a burning pain in her epigastric region but denies this pain currently. She reports she has been experiencing an occasional pain in her lower abdomen at night. She reports she last ate around 0900 this morning and this has stayed down. She reports she has around four episodes of vomiting a day. She reports in the mornings when she first wakes up she has been getting dizzy or when she walks a lot she gets dizzy. Not dizzy currently. Denies VB. Endorses strong smelling urine. Denies burning with urination and urinary urgency.   She reports the biggest trigger for vomiting for her is when her stomach is empty. She mostly desires medications to assist with her nausea so she does not have to keep missing work. 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  LMP: 12/26/2022 Onset of complaint:  Pain score: 8/10 epigastric  Lab orders placed from triage:  POCT Preg

## 2023-02-14 NOTE — Telephone Encounter (Signed)
Call to [facility] to see if she had an OB appt- pt states she does not have an appt until next month. Pregnancy was confirmed via  a home pregnancy  test. RN stated pt could  come to Urgent Care to have pregnancy confirmed in [facility] system & then would be able to go  to MAU for treatment and evaluation of morning sickness. Pt states she is approx 7 weeks.

## 2023-02-15 LAB — GC/CHLAMYDIA PROBE AMP (~~LOC~~) NOT AT ARMC
Chlamydia: NEGATIVE
Comment: NEGATIVE
Comment: NORMAL
Neisseria Gonorrhea: NEGATIVE

## 2023-02-16 ENCOUNTER — Telehealth: Payer: Self-pay | Admitting: *Deleted

## 2023-02-16 NOTE — Telephone Encounter (Signed)
Pt called stating she went to ED on Monday for nausea in early pregnancy.  She was prescribed Reglan.,Phenergan,Pepcid and Scopolamine patch.  She got up this morning and stated that she had blurry vision and finding it hard to read anything.  She did call out of work today because she was afraid to drive.  Scopolamine patches can cause blurred vision.  She denies having a headache just concerned with the blurred vision.  I suggested that she go to MAU for evaluation since this is a new onset.  She states that she does not have an eye doctor either.  Pt has decided that she may take the patch off and see if her vision gets better.  If it doesn't she will have someone take her to MAU.

## 2023-02-28 ENCOUNTER — Other Ambulatory Visit: Payer: Self-pay

## 2023-02-28 ENCOUNTER — Inpatient Hospital Stay (HOSPITAL_COMMUNITY)
Admission: AD | Admit: 2023-02-28 | Discharge: 2023-02-28 | Disposition: A | Discharge status: Home / Self Care | Payer: Commercial Managed Care - PPO | Attending: Family Medicine | Admitting: Family Medicine

## 2023-02-28 ENCOUNTER — Encounter (HOSPITAL_COMMUNITY): Payer: Self-pay | Admitting: Family Medicine

## 2023-02-28 DIAGNOSIS — O26891 Other specified pregnancy related conditions, first trimester: Secondary | ICD-10-CM | POA: Diagnosis not present

## 2023-02-28 DIAGNOSIS — Z3A01 Less than 8 weeks gestation of pregnancy: Secondary | ICD-10-CM | POA: Diagnosis not present

## 2023-02-28 DIAGNOSIS — O26811 Pregnancy related exhaustion and fatigue, first trimester: Secondary | ICD-10-CM | POA: Insufficient documentation

## 2023-02-28 DIAGNOSIS — R718 Other abnormality of red blood cells: Secondary | ICD-10-CM | POA: Diagnosis not present

## 2023-02-28 DIAGNOSIS — O219 Vomiting of pregnancy, unspecified: Secondary | ICD-10-CM | POA: Insufficient documentation

## 2023-02-28 LAB — URINALYSIS, ROUTINE W REFLEX MICROSCOPIC
Bilirubin Urine: NEGATIVE
Glucose, UA: NEGATIVE mg/dL
Hgb urine dipstick: NEGATIVE
Ketones, ur: NEGATIVE mg/dL
Leukocytes,Ua: NEGATIVE
Nitrite: NEGATIVE
Protein, ur: NEGATIVE mg/dL
Specific Gravity, Urine: 1.005 (ref 1.005–1.030)
pH: 7 (ref 5.0–8.0)

## 2023-02-28 LAB — CBC
HCT: 37.7 % (ref 36.0–46.0)
Hemoglobin: 12.6 g/dL (ref 12.0–15.0)
MCH: 26 pg (ref 26.0–34.0)
MCHC: 33.4 g/dL (ref 30.0–36.0)
MCV: 77.7 fL — ABNORMAL LOW (ref 80.0–100.0)
Platelets: 234 10*3/uL (ref 150–400)
RBC: 4.85 MIL/uL (ref 3.87–5.11)
RDW: 13.6 % (ref 11.5–15.5)
WBC: 8.3 10*3/uL (ref 4.0–10.5)
nRBC: 0 % (ref 0.0–0.2)

## 2023-02-28 MED ORDER — BUSPIRONE HCL 30 MG PO TABS: 30.0000 mg | ORAL_TABLET | Freq: Every day | ORAL | Status: DC

## 2023-02-28 MED ORDER — ESCITALOPRAM OXALATE 20 MG PO TABS: 20.0000 mg | ORAL_TABLET | Freq: Every day | ORAL | 0 refills | Status: DC

## 2023-02-28 MED ORDER — FERROUS SULFATE 325 (65 FE) MG PO TABS: 325.0000 mg | ORAL_TABLET | ORAL | 2 refills | Status: DC

## 2023-02-28 MED ORDER — METOCLOPRAMIDE HCL 10 MG PO TABS: 10.0000 mg | ORAL_TABLET | Freq: Four times a day (QID) | ORAL | 1 refills | Status: DC | PRN
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
## 2023-02-28 NOTE — Discharge Instructions (Signed)
Recommend taking 1/2 pill of phenergan about 2 hrs to bedtime.  Try to scopolamine patch again, can apply a Band-Aid over it to prevent it from inadvertently getting into your eyes.  Start iron supplementation, 1 tablet every other day, with some vitamin C or orange juice  Try small frequent meals...like grazing instead of full meals to improve nausea and post meal fatigue  Follow up for prenatal care.

## 2023-02-28 NOTE — MAU Note (Signed)
Adriana Dawson is a 37 y.o. at [redacted]w[redacted]d here in MAU reporting: she's unable to stay awake, every time she sits her  eyes keep closing.  States when she wakes she's still sleepy.  Also reports she's "a little dizzy too."  Denies pain or VB. LMP: 12/26/2022 Onset of complaint: 1 week Pain score: 0 Vitals:   02/28/23 1006  BP: 100/72  Pulse: 92  Resp: 18  Temp: 97.9 F (36.6 C)  SpO2: 100%     FHT:NA Lab orders placed from triage:   UA

## 2023-03-09 ENCOUNTER — Encounter: Payer: Self-pay | Admitting: Obstetrics and Gynecology

## 2023-03-09 ENCOUNTER — Encounter (INDEPENDENT_AMBULATORY_CARE_PROVIDER_SITE_OTHER): Payer: Self-pay

## 2023-03-09 ENCOUNTER — Ambulatory Visit (INDEPENDENT_AMBULATORY_CARE_PROVIDER_SITE_OTHER): Payer: Commercial Managed Care - PPO

## 2023-03-09 ENCOUNTER — Ambulatory Visit (INDEPENDENT_AMBULATORY_CARE_PROVIDER_SITE_OTHER): Payer: Commercial Managed Care - PPO | Admitting: Obstetrics and Gynecology

## 2023-03-09 VITALS — BP 105/71 | HR 82 | Wt 162.0 lb

## 2023-03-09 DIAGNOSIS — Z3481 Encounter for supervision of other normal pregnancy, first trimester: Secondary | ICD-10-CM

## 2023-03-09 DIAGNOSIS — O26811 Pregnancy related exhaustion and fatigue, first trimester: Secondary | ICD-10-CM | POA: Diagnosis not present

## 2023-03-09 DIAGNOSIS — Z3480 Encounter for supervision of other normal pregnancy, unspecified trimester: Secondary | ICD-10-CM | POA: Diagnosis not present

## 2023-03-09 DIAGNOSIS — O219 Vomiting of pregnancy, unspecified: Secondary | ICD-10-CM

## 2023-03-09 DIAGNOSIS — K219 Gastro-esophageal reflux disease without esophagitis: Secondary | ICD-10-CM | POA: Diagnosis not present

## 2023-03-09 DIAGNOSIS — F322 Major depressive disorder, single episode, severe without psychotic features: Secondary | ICD-10-CM

## 2023-03-09 DIAGNOSIS — Z3A09 9 weeks gestation of pregnancy: Secondary | ICD-10-CM

## 2023-03-09 MED ORDER — HYDROXYZINE HCL 50 MG PO TABS
50.0000 mg | ORAL_TABLET | Freq: Every evening | ORAL | 0 refills | Status: DC | PRN
Start: 2023-03-09 — End: 2023-07-06

## 2023-03-09 NOTE — Progress Notes (Signed)
PHQ 9 Score: 17 GAD 7 Score: 17

## 2023-03-09 NOTE — Patient Instructions (Signed)
We highly recommend childbirth education to help you plan for labor and begin practicing coping skills (which will be needed with or without pain meds).  [place] Childbirth Education Options: Sign up by visiting [email or web address]  Childbirth ~ Self-Paced eClass (English and Spanish) This online class offers you the freedom to complete a childbirth education series in the comfort of your own home at your own pace.  Childbirth Class (In-Person 4-Week Series  or on Saturdays, Virtual 4-Week Series ~ [place]) This interactive in-person class series will help you and your partner prepare for your birth experience. Topics include: Labor & Birth, Comfort Measures, Breathing Techniques, Massage, Medical Interventions, Pain Management Options, Cesarean Birth, Postpartum Care, and Newborn Care  Comfort Techniques for Labor ~ In-Person Class [facility]) This interactive class is designed for parents-to-be who want to learn & practice hands-on skills to help relieve some of the discomfort of labor and encourage their babies to rotate toward the best position for birth. Moms and their partners will be able to try a variety of labor positions with birth balls and rebozos as well as practice breathing, relaxation, and visualization techniques.  Natural Childbirth Class (In-Person 5-Week Series, In-Person on Saturdays or Virtual 5-Week Series ~ [place]) This class series is designed for expectant parents who want to learn and practice natural methods of coping with the process of labor and childbirth.  Cesarean Birth Self-Paced eClass (English and Spanish) This online course provides comprehensive information you can trust as you prepare for a possible cesarean birth. In this class, you'll learn how to make your birth and recovery comfortable and joyful through instructive video clips, animations, and activities.  Waterbirth ~ Airline pilot Interested in a waterbirth? In addition to a consultation  with your credentialed waterbirth provider, this free, informational online class will help you discover whether waterbirth is the right fit for you. Not all obstetrical practices offer waterbirth, so check with your healthcare provider.  Tour Probation officer) - [facility] [employer] our 4 minute video tour of [facility] located in [place].   [place] Parenting Education Options:  Pregnancy 101 (Virtual) Congratulations on your pregnancy! This class is geared toward moms in their first trimester, but everyone is welcome. We are excited to guide you through all aspects of supporting a healthy pregnancy. You will learn what to expect at routine prenatal care appointments, common postpartum adjustments, basic infant safety, and breastfeeding.  Successful Partnering & Parenting ~ In-Person Workshop [facility]) This workshop inspires and equips partners of all economic levels, ages, and cultures to confidently care for their infants, support the birthing persons, and navigate their own transformations into new partners and parents. Learning activities are geared towards supporting partner, but moms are welcome to attend.  'Baby & Me' Parenting Group (Virtual on Wednesdays at 11am) Enjoy this time discussing newborn & infant parenting topics and family adjustment issues with other new parents in a relaxed environment. Each week brings a new speaker or baby-centered activity. This group offers support and connection to parents as they journey through the adjustments and struggles of that sometimes overwhelming first year after the birth of a child.  Baby Safety, CPR, & Choking Class ~ Virtual This life-saving information is meant to encourage parents as they learn important safety and prevention tips as well as infant CPR and relief of choking.  Breastfeeding Class (In-Person in [place] or [employer]) Families learn what to expect in the  first days and weeks of breastfeeding your  newborn. IF YOU ARE AN EMPLOYEE TAKING THIS CLASS FOR CREDIT, DO NOT register yourself. Please e-mail [email or web address].   Breastfeeding Self-Paced eClass (English & Spanish) Families learn what to expect in the first days and weeks of breastfeeding your newborn.  Caring for Baby ~ In-Person, Virtual or Self-Paced Class This in-person class is for both expectant and adoptive parents who want to learn and practice the most up-to-date newborn care for their babies. Focus is on birth through the first six weeks of life.  CPR & Choking Relief for Infants & Children ~ In-Person Class [facility]) This in-person course is designed for any parent, expectant parent, or adult who cares for infants or children. Participants learn and demonstrate cardiopulmonary resuscitation and choking relief procedures for both infants and children.  Grandparent Love ~ In-Person Class Grandparents will learn the most updated infant care and safety recommendations. They will discover ways to support their own children during the transition into the parenting role and receive tips on communicating with the new parents.  [place] Parenting Support Group Options:  Bereavement Grief Support Group (Pregnancy/Infant Loss) - Virtual This is an ongoing experience that meets once a month and is designed to help you honor the past, assist you in discovering tools to strengthen you today, and aid you in developing hope for the future.  Breastfeeding & Pumping Support Group (In-Person on Thursdays at 12pm or Virtual on Tuesdays at 5pm) Join [place] in-person each Thursday starting June 1st, 2023 at 12pm! This support group is free for all families looking for breastfeeding and/or pumping support.   Community-Based Childbirth Education Options:  [facility] Department Classes:  Childbirth education classes can help you get ready for a positive parenting experience. You  can also meet other expectant parents and get free stuff for your baby. Each class runs for five weeks on the same night and costs $45 for the mother-to-be and her support person. Medicaid covers the cost if you are eligible. Call [phone] to register.  [facility] offers a variety of programs for the [employer] and is another great way to get connected. Please go to [email or web address] for more information.  Childbirth With A Twist! Be informed of your options, get educated on birth, understand what your body is doing, learn how to cope, and have a lot of fun and laughs all while doing it either from the comfort of your couch OR in our cozy office and classroom space near the [place] airport. If you are taking a virtual class, then class is taught LIVE, so you can ask questions and receive answers in real-time from an experienced doula and childbirth educator.  This virtual childbirth education class will meet for five instruction times online.  Although we are based in [place], [place], this virtual class is open to anyone in the world. Please visit: [email or web address] for more information.  Books We Love: The Doula Guide to Childbirth by Harland German and Otila Back The First-Time Parent's Childbirth Handbook by Dr. Amie Critchley, CNM The Birth Partner by Truddie Crumble   .safe
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
## 2023-03-12 LAB — PREGNANCY, INITIAL SCREEN
Antibody Screen: NEGATIVE
Basophils Absolute: 0 10*3/uL (ref 0.0–0.2)
Basos: 1 %
Bilirubin, UA: NEGATIVE
Chlamydia trachomatis, NAA: NEGATIVE
EOS (ABSOLUTE): 0.1 10*3/uL (ref 0.0–0.4)
Eos: 2 %
Glucose, UA: NEGATIVE
HCV Ab: NONREACTIVE
HIV Screen 4th Generation wRfx: NONREACTIVE
Hematocrit: 37.1 % (ref 34.0–46.6)
Hemoglobin: 12.2 g/dL (ref 11.1–15.9)
Hepatitis B Surface Ag: NEGATIVE
Immature Grans (Abs): 0 10*3/uL (ref 0.0–0.1)
Immature Granulocytes: 0 %
Ketones, UA: NEGATIVE
Leukocytes,UA: NEGATIVE
Lymphocytes Absolute: 2.1 10*3/uL (ref 0.7–3.1)
Lymphs: 26 %
MCH: 26.5 pg — ABNORMAL LOW (ref 26.6–33.0)
MCHC: 32.9 g/dL (ref 31.5–35.7)
MCV: 81 fL (ref 79–97)
Monocytes Absolute: 0.7 10*3/uL (ref 0.1–0.9)
Monocytes: 9 %
Neisseria Gonorrhoeae by PCR: NEGATIVE
Neutrophils Absolute: 5.2 10*3/uL (ref 1.4–7.0)
Neutrophils: 62 %
Nitrite, UA: NEGATIVE
Platelets: 206 10*3/uL (ref 150–450)
Protein,UA: NEGATIVE
RBC, UA: NEGATIVE
RBC: 4.61 x10E6/uL (ref 3.77–5.28)
RDW: 15.2 % (ref 11.7–15.4)
RPR Ser Ql: NONREACTIVE
Rh Factor: POSITIVE
Rubella Antibodies, IGG: 1.27 index (ref >0.99)
Specific Gravity, UA: 1.015 (ref 1.005–1.030)
Urobilinogen, Ur: 0.2 mg/dL (ref 0.2–1.0)
WBC: 8.3 10*3/uL (ref 3.4–10.8)
pH, UA: 8 — ABNORMAL HIGH (ref 5.0–7.5)

## 2023-03-12 LAB — HCV INTERPRETATION

## 2023-03-12 LAB — URINE CULTURE, OB REFLEX: Organism ID, Bacteria: NO GROWTH

## 2023-03-12 LAB — MICROSCOPIC EXAMINATION
Bacteria, UA: NONE SEEN
Casts: NONE SEEN /lpf

## 2023-03-12 LAB — HEMOGLOBIN A1C
Est. average glucose Bld gHb Est-mCnc: 117 mg/dL
Hgb A1c MFr Bld: 5.7 % — ABNORMAL HIGH (ref 4.8–5.6)

## 2023-03-14 ENCOUNTER — Encounter: Payer: Self-pay | Admitting: *Deleted

## 2023-03-25 DIAGNOSIS — Z3481 Encounter for supervision of other normal pregnancy, first trimester: Secondary | ICD-10-CM | POA: Diagnosis not present

## 2023-03-30 ENCOUNTER — Encounter: Payer: Self-pay | Admitting: Obstetrics and Gynecology

## 2023-03-30 ENCOUNTER — Ambulatory Visit (INDEPENDENT_AMBULATORY_CARE_PROVIDER_SITE_OTHER): Payer: Commercial Managed Care - PPO | Admitting: Obstetrics and Gynecology

## 2023-03-30 VITALS — BP 120/79 | HR 73 | Wt 163.0 lb

## 2023-03-30 DIAGNOSIS — Z3480 Encounter for supervision of other normal pregnancy, unspecified trimester: Secondary | ICD-10-CM | POA: Diagnosis not present

## 2023-03-30 DIAGNOSIS — R7309 Other abnormal glucose: Secondary | ICD-10-CM

## 2023-03-30 MED ORDER — ONDANSETRON 4 MG PO TBDP
4.0000 mg | ORAL_TABLET | Freq: Four times a day (QID) | ORAL | 1 refills | Status: DC | PRN
Start: 2023-03-30 — End: 2023-04-29

## 2023-03-30 MED ORDER — FAMOTIDINE 20 MG PO TABS
20.0000 mg | ORAL_TABLET | Freq: Every day | ORAL | 3 refills | Status: DC
Start: 2023-03-30 — End: 2023-07-06

## 2023-03-30 MED ORDER — METOCLOPRAMIDE HCL 10 MG PO TABS
10.0000 mg | ORAL_TABLET | Freq: Four times a day (QID) | ORAL | 1 refills | Status: DC | PRN
Start: 2023-03-30 — End: 2023-07-06

## 2023-03-30 NOTE — Progress Notes (Signed)
   PRENATAL VISIT NOTE  Subjective:  Adriana Dawson is a 37 y.o. G2P0010 at [redacted]w[redacted]d being seen today for ongoing prenatal care.  She is currently monitored for the following issues for this low-risk pregnancy and has MDD (major depressive disorder), single episode, severe , no psychosis (HCC); History of positive PPD; Belching; PTSD (post-traumatic stress disorder); Sprain of right ankle; Supervision of other normal pregnancy, antepartum; and Elevated hemoglobin A1c on their problem list.  Patient reports no complaints.   . Vag. Bleeding: None.   . Denies leaking of fluid.   The following portions of the patient's history were reviewed and updated as appropriate: allergies, current medications, past family history, past medical history, past social history, past surgical history and problem list.   Objective:   Vitals:   03/30/23 0826  BP: 120/79  Pulse: 73  Weight: 163 lb (73.9 kg)    Fetal Status: Fetal Heart Rate (bpm): 153         General:  Alert, oriented and cooperative. Patient is in no acute distress.  Skin: Skin is warm and dry. No rash noted.   Cardiovascular: Normal heart rate noted  Respiratory: Normal respiratory effort, no problems with respiration noted  Abdomen: Soft, gravid, appropriate for gestational age.        Pelvic: Cervical exam deferred        Extremities: Normal range of motion.  Edema: None  Mental Status: Normal mood and affect. Normal behavior. Normal judgment and thought content.   Assessment and Plan:  Pregnancy: G2P0010 at [redacted]w[redacted]d 1. Supervision of other normal pregnancy, antepartum Has anatomy US scheduled. AFP next time.  - Glucose Tolerance, 2 Hours w/1 Hour  2. Elevated hemoglobin A1c Checkign 2 hr today for elevated A1C of 5.7.   Preterm labor symptoms and general obstetric precautions including but not limited to vaginal bleeding, contractions, leaking of fluid and fetal movement were reviewed in detail with the patient. Please refer to After  Visit Summary for other counseling recommendations.   Return in about 4 weeks (around 04/27/2023) for LROB VISIT, MD or APP.  Future Appointments  Date Time Provider Department Center  05/17/2023  1:15 PM [facility] NURSE [facility]  05/17/2023  1:30 PM [facility] US3 [facility]    Milas Hock, MD

## 2023-03-31 LAB — GLUCOSE TOLERANCE, 2 HOURS W/ 1HR
Glucose, 1 hour: 146 mg/dL (ref 70–179)
Glucose, 2 hour: 128 mg/dL (ref 70–152)
Glucose, Fasting: 85 mg/dL (ref 70–91)

## 2023-04-02 LAB — PANORAMA PRENATAL TEST FULL PANEL:PANORAMA TEST PLUS 5 ADDITIONAL MICRODELETIONS: FETAL FRACTION: 2.8

## 2023-04-05 ENCOUNTER — Telehealth: Payer: Self-pay

## 2023-04-05 ENCOUNTER — Encounter: Payer: Self-pay | Admitting: Family Medicine

## 2023-04-05 ENCOUNTER — Other Ambulatory Visit: Payer: Self-pay

## 2023-04-05 DIAGNOSIS — Z141 Cystic fibrosis carrier: Secondary | ICD-10-CM | POA: Insufficient documentation

## 2023-04-05 DIAGNOSIS — R898 Other abnormal findings in specimens from other organs, systems and tissues: Secondary | ICD-10-CM

## 2023-04-05 LAB — HORIZON CUSTOM: REPORT SUMMARY: POSITIVE — AB

## 2023-04-05 NOTE — Telephone Encounter (Signed)
Spoke with pt and she is aware that Horizon test shows positive for carrier for CF. Referral to MFM genetic counselor placed and pt is aware they will reach out to schedule appointment. Pt is also aware she can call [facility] and speak to their genetic counselor at no cost in the meantime. Pt expressed understanding.

## 2023-04-19 ENCOUNTER — Other Ambulatory Visit: Payer: Self-pay

## 2023-04-19 ENCOUNTER — Other Ambulatory Visit (HOSPITAL_COMMUNITY): Payer: Self-pay

## 2023-04-19 MED ORDER — ESCITALOPRAM OXALATE 20 MG PO TABS
30.0000 mg | ORAL_TABLET | Freq: Every day | ORAL | 0 refills | Status: DC
  Filled 2023-04-19: qty 45, 30d supply, fill #0
  Filled 2023-06-07: qty 45, 30d supply, fill #1

## 2023-04-19 MED ORDER — ESCITALOPRAM OXALATE 20 MG PO TABS
30.0000 mg | ORAL_TABLET | Freq: Every day | ORAL | 0 refills | Status: DC
  Filled 2023-04-19: qty 135, 90d supply, fill #0

## 2023-04-19 MED ORDER — BUSPIRONE HCL 30 MG PO TABS
30.0000 mg | ORAL_TABLET | Freq: Two times a day (BID) | ORAL | 0 refills | Status: DC
  Filled 2023-04-19: qty 180, 90d supply, fill #0
  Filled 2023-04-19: qty 60, 30d supply, fill #0

## 2023-04-28 ENCOUNTER — Other Ambulatory Visit (HOSPITAL_COMMUNITY): Payer: Self-pay

## 2023-04-28 DIAGNOSIS — Z1329 Encounter for screening for other suspected endocrine disorder: Secondary | ICD-10-CM | POA: Diagnosis not present

## 2023-04-28 DIAGNOSIS — Z130 Encounter for screening for diseases of the blood and blood-forming organs and certain disorders involving the immune mechanism: Secondary | ICD-10-CM | POA: Diagnosis not present

## 2023-04-28 DIAGNOSIS — E7800 Pure hypercholesterolemia, unspecified: Secondary | ICD-10-CM | POA: Diagnosis not present

## 2023-04-28 DIAGNOSIS — Z3A16 16 weeks gestation of pregnancy: Secondary | ICD-10-CM | POA: Diagnosis not present

## 2023-04-28 MED ORDER — ESCITALOPRAM OXALATE 20 MG PO TABS: ORAL_TABLET | ORAL | 1 refills | Status: DC

## 2023-04-28 MED ORDER — BUSPIRONE HCL 30 MG PO TABS
30.0000 mg | ORAL_TABLET | Freq: Two times a day (BID) | ORAL | 2 refills | Status: DC
  Filled 2023-06-07: qty 180, 90d supply, fill #0

## 2023-04-29 ENCOUNTER — Ambulatory Visit: Payer: Commercial Managed Care - PPO | Admitting: Obstetrics and Gynecology

## 2023-04-29 VITALS — BP 106/73 | HR 82 | Wt 164.0 lb

## 2023-04-29 DIAGNOSIS — Z3A16 16 weeks gestation of pregnancy: Secondary | ICD-10-CM | POA: Diagnosis not present

## 2023-04-29 DIAGNOSIS — Z3480 Encounter for supervision of other normal pregnancy, unspecified trimester: Secondary | ICD-10-CM

## 2023-04-29 DIAGNOSIS — O219 Vomiting of pregnancy, unspecified: Secondary | ICD-10-CM

## 2023-04-29 MED ORDER — ONDANSETRON 4 MG PO TBDP
4.0000 mg | ORAL_TABLET | Freq: Four times a day (QID) | ORAL | 2 refills | Status: DC | PRN
Start: 2023-04-29 — End: 2023-07-07

## 2023-04-29 NOTE — Progress Notes (Signed)
Pt c/o ongoing nausea and headaches

## 2023-04-29 NOTE — Progress Notes (Signed)
   PRENATAL VISIT NOTE  Subjective:  Adriana Dawson is a 37 y.o. G2P0010 at [redacted]w[redacted]d being seen today for ongoing prenatal care.  She is currently monitored for the following issues for this low-risk pregnancy and has MDD (major depressive disorder), single episode, severe , no psychosis (HCC); History of positive PPD; Belching; PTSD (post-traumatic stress disorder); Sprain of right ankle; Supervision of other normal pregnancy, antepartum; Elevated hemoglobin A1c; and Cystic fibrosis carrier on their problem list.  Patient reports  reports ongoing nausea, relieved by zofran Occasional headaches relieved by tylenol.  Contractions: Not present. Vag. Bleeding: None.  Movement: Absent. Denies leaking of fluid.   The following portions of the patient's history were reviewed and updated as appropriate: allergies, current medications, past family history, past medical history, past social history, past surgical history and problem list.   Objective:   Vitals:   04/29/23 0831  BP: 106/73  Pulse: 82  Weight: 164 lb (74.4 kg)    Fetal Status: Fetal Heart Rate (bpm): 140   Movement: Absent     General:  Alert, oriented and cooperative. Patient is in no acute distress.  Skin: Skin is warm and dry. No rash noted.   Cardiovascular: Normal heart rate noted  Respiratory: Normal respiratory effort, no problems with respiration noted  Abdomen: Soft, gravid, appropriate for gestational age.  Pain/Pressure: Absent     Pelvic: Cervical exam deferred        Extremities: Normal range of motion.  Edema: None  Mental Status: Normal mood and affect. Normal behavior. Normal judgment and thought content.   Assessment and Plan:  Pregnancy: G2P0010 at [redacted]w[redacted]d 1. Supervision of other normal pregnancy, antepartum BP and FHR normal Discussed tx for headache and precautions when to follow up   2. [redacted] weeks gestation of pregnancy Anatomy u/s 8/20 AFP today  3. Nausea and vomiting in pregnancy Ongoing nausea, reports  zofran is helping. Will send refill to pharmacy Encouraged adequate hydration, small frequent meals   Preterm labor symptoms and general obstetric precautions including but not limited to vaginal bleeding, contractions, leaking of fluid and fetal movement were reviewed in detail with the patient. Please refer to After Visit Summary for other counseling recommendations.   Return in about 4 weeks (around 05/27/2023) for OB VISIT (MD or APP).  Future Appointments  Date Time Provider Department Center  05/17/2023  1:15 PM [facility] NURSE [facility]  05/17/2023  1:30 PM [facility] US3 [facility]  05/27/2023  9:50 AM Sue Lush, FNP [facility]   Albertine Grates, FNP

## 2023-05-11 DIAGNOSIS — O09529 Supervision of elderly multigravida, unspecified trimester: Secondary | ICD-10-CM | POA: Insufficient documentation

## 2023-05-17 ENCOUNTER — Ambulatory Visit: Payer: Commercial Managed Care - PPO

## 2023-05-17 DIAGNOSIS — O09522 Supervision of elderly multigravida, second trimester: Secondary | ICD-10-CM

## 2023-05-27 ENCOUNTER — Encounter: Payer: Self-pay | Admitting: *Deleted

## 2023-05-27 ENCOUNTER — Ambulatory Visit: Payer: Commercial Managed Care - PPO | Attending: Obstetrics and Gynecology | Admitting: *Deleted

## 2023-05-27 ENCOUNTER — Ambulatory Visit (HOSPITAL_BASED_OUTPATIENT_CLINIC_OR_DEPARTMENT_OTHER): Payer: Commercial Managed Care - PPO

## 2023-05-27 ENCOUNTER — Encounter: Payer: Commercial Managed Care - PPO | Admitting: Obstetrics and Gynecology

## 2023-05-27 ENCOUNTER — Other Ambulatory Visit: Payer: Self-pay | Admitting: *Deleted

## 2023-05-27 VITALS — BP 108/60 | HR 72

## 2023-05-27 DIAGNOSIS — O09522 Supervision of elderly multigravida, second trimester: Secondary | ICD-10-CM | POA: Diagnosis not present

## 2023-05-27 DIAGNOSIS — Z3481 Encounter for supervision of other normal pregnancy, first trimester: Secondary | ICD-10-CM

## 2023-05-27 DIAGNOSIS — Z3689 Encounter for other specified antenatal screening: Secondary | ICD-10-CM

## 2023-05-27 DIAGNOSIS — Z363 Encounter for antenatal screening for malformations: Secondary | ICD-10-CM | POA: Insufficient documentation

## 2023-06-01 ENCOUNTER — Ambulatory Visit: Payer: Commercial Managed Care - PPO | Attending: Obstetrics and Gynecology

## 2023-06-01 DIAGNOSIS — O09892 Supervision of other high risk pregnancies, second trimester: Secondary | ICD-10-CM

## 2023-06-03 NOTE — Progress Notes (Signed)
[facility] [address] Phone:  [phone]   Fax:  [phone]    Name: Palmyra Gani Indication: Maternal carrier for cystic fibrosis.  DOB: 07/29/1986 Age: 37 y.o.   EDD: 10/11/2023 LMP: 12/26/2022 Referring Provider:  Sue Lush, FNP   EGA: [redacted]w[redacted]d Genetic Counselor: Sheppard Plumber, MS  OB Hx: [id number] Date of Appointment: 06/01/2023  Accompanied by: Maninder ([name]). Face to Face Time: 30 Minutes   Pregnancy History:   This is Ms. Rosaline's second pregnancy. She has had one elective loss. Denies personal history of diabetes, high blood pressure, thyroid conditions, and seizures. Denies bleeding, infections, and fevers in this pregnancy. Denies using tobacco, alcohol, or street drugs in this pregnancy.   Family History: A three-generation pedigree was created and scanned into Epic under the Media tab.  Ms. Rena's brother has a 2 and a half yo son who had congenital clubfoot that was surgically repaired. The patient reports no other health concerns in her nephew. The majority of cases of clubfoot/feet are isolated; however, it is a feature in more than 200 known genetic syndromes, including both chromosomal and single gene conditions. If the clubfoot diagnosis were idiopathic, then the risk to the current and future pregnancies is likely to be that of the general population risk (~0.1-0.4%). Without genetic testing reports, it is difficult to assess the risk to the fetus. If Ms. Tyrina learns of additional information regarding her nephew's diagnosis, we are happy to revisit and reassess the risk.  Ms. Tirza mother was diagnosed with breast cancer in her 15's. We discussed that different types of cancer can have a hereditary component that increases an individual's susceptibility to develop cancer; however, most cancers occur by chance due to a combination of genetics and the environment. It is recommended Ms. Jaclyne  inform her PCP about her family history so they can discuss appropriate screening and testing options, including a possible referral to cancer genetic counseling.   Maternal ethnicity reported as [place] and paternal ethnicity reported as [place]. Denies Ashkenazi Jewish ancestry.  Family history not remarkable for consanguinity, individuals with intellectual disability, autism spectrum disorder, multiple spontaneous abortions, still births, or unexplained neonatal death.     Genetic Counseling:   Maternal Carrier for Cystic Fibrosis. Ms. Milan is a carrier for the autosomal recessive condition cystic fibrosis (CF). Specifically, the laboratory reports she is positive for the pathogenic variant [id number] (p.V456A) in the CFTR gene.   We discussed that mucus lubricates and protects the linings of the airways, digestive system, reproductive system, and other organs and tissues. When functioning correctly, the CFTR gene provides instructions for a channel that transports chloride ions into and out of cells. The flow of chloride ions helps control the movement of water in the body's tissues, which is necessary for the production of thin, freely flowing mucus. Pathogenic variants in the CFTR gene disrupt the function of the chloride channels, preventing them from regulating the flow of chloride ions and water across cell membranes. Individuals affected with CF therefore have abnormally thick, sticky mucus that cannot easily be cleared from the airways and digestive system, leading to progressive damage to the respiratory system and chronic digestive system problems. We discussed that common features of CF include respiratory difficulties, bacterial infections in the lungs, the formation of scar tissue (fibrosis) and cysts in the lungs, pancreatic insufficiency, CF-related diabetes mellitus, diarrhea, malnutrition, poor growth, and weight loss. Most men with CF have congenital  bilateral absence of the vas deferens  (CBAVD) which causes female infertility. With therapies, such as daily respiratory therapies and medications to aid digestion, the median lifespan for people with CF is now in the 40's to 50's. Treatment may involve lung transplantation and CFTR protein modulators in some cases. CF is variably expressed, meaning features of the condition and their severity vary among affected individuals. Expression and severity of CF depends upon the specific mutations present in an affected individual.   Given that CF has autosomal recessive inheritance, Ms. Makailyn was counseled that the current pregnancy would be at risk to be affected if her reproductive partner Maninder is also a carrier for the condition. Based on Maninder's ethnic background, the chance for him to be a carrier is 1 in 55. Based on Ms. Kechia's carrier results and Maninder's ethnicity alone, the chance that the current pregnancy would be affected with CF is approximately 1 in 376 (~0.3%).  If Maninder completes carrier screening for CF and is found to be a carrier, the risk for CF in the current pregnancy would be 1 in 4 (25%). If both members of a couple are known to be carriers, diagnostic testing is available to determine if the pregnancy is affected. This can be performed via amniocentesis, which involves the removal of a small amount of amniotic fluid from the sac surrounding the pregnancy. Ultrasound guidance is used throughout the procedure. Possible procedural difficulties and complications that can arise with these procedures include maternal infection, cramping, bleeding, fluid leakage, and/or pregnancy loss. The risk for pregnancy loss with a amniocentesis is 1/500. If diagnostic testing via amniocentesis is not desired, CF testing can be completed after birth and it is also included in newborn screening in [place]. The newborn screening test utilizes trypsinogen levels rather than genetic testing to detect affected individuals whereas  genetic testing can determine the specific variants, if any, that a child may have inherited.  Carrier screening for cystic fibrosis was offered to Maninder. He elected carrier screening, and his Horizon carrier screening labs were drawn during the visit. Maninder consented that the results be shared with Ms. Calliope.  For the other three conditions screened for, Ms. Naoko screened to not be a carrier. Please see report for details. A negative result on carrier screening reduces but does not eliminate the chance of being a carrier.   Advanced maternal age. We briefly discussed that the chance that a fetus would be affected with a chromosome difference increases with advanced maternal age. The chance that Ms. Milca's current pregnancy would be affected with a chromosome difference based on her age alone is approximately 1 in 123 (~0.8%). This means that there is a 122 in 123 (~99.2%) chance that the fetus would not be affected with a chromosome difference. We discussed that this risk may also be lower given her low-risk cfDNA screening results.  Newborn Screening. The [place] Newborn Screening (NBS) program will screen all newborn babies for cystic fibrosis, spinal muscular atrophy, hemoglobinopathies, and numerous other conditions.  Birth Defects. All babies have approximately a 3-5% risk for a birth defect and a majority of these defects cannot be detected through the screening or diagnostic testing listed. Ultrasound may detect some birth defects, but it may not detect all birth defects. About half of pregnancies with Down syndrome do not show any soft markers on ultrasound. A normal ultrasound does not guarantee a healthy pregnancy.   Previous Testing Completed:  Ms. Meda previously completed cell-free DNA screening (cfDNA)  in this pregnancy. The result is low risk, consistent with a female fetus. This screening significantly reduces but does not eliminate the chance that the current pregnancy  has Down syndrome (trisomy 41), trisomy 37, trisomy 78, and common sex chromosome conditions. Please see report for details. Additionally, there are many genetic conditions that cannot be detected by cfDNA.   Ms. Shantil previously completed a maternal serum AFP screen in this pregnancy. The result is screen negative. Please see report for details. A negative result reduces the risk that the current pregnancy has an open neural tube defect. Closed neural tube defects and some open defects may not be detected by this screen.   Patient Plan:  Proceed with: Routine prenatal care. Horizon carrier screening for cystic fibrosis was drawn for patient's partner today 06/01/2023. Informed consent was obtained. All questions were answered.   Thank you for sharing in the care of Ms. Ahava with [place].  Please do not hesitate to contact us at [phone] if you have any questions.  Sheppard Plumber, MS Genetic Counselor  Genetic counseling student involved in appointment: No.

## 2023-06-07 ENCOUNTER — Other Ambulatory Visit (HOSPITAL_COMMUNITY): Payer: Self-pay

## 2023-06-15 ENCOUNTER — Telehealth: Payer: Self-pay | Admitting: Obstetrics and Gynecology

## 2023-06-15 NOTE — Telephone Encounter (Signed)
Left voicemail for patient to call to review CF carrier results on her partner, Maninder Thedore Mins.

## 2023-06-27 ENCOUNTER — Telehealth: Payer: Self-pay | Admitting: Obstetrics and Gynecology

## 2023-06-27 NOTE — Telephone Encounter (Signed)
We spoke with Adriana Dawson to review the results of carrier screening for her partner, Adriana Dawson (dob 10/13/1990).  He elected to have carrier screening for Cystic Fibrosis (CF) because she is known to be a carrier and is currently pregnant.  The mode of inheritance, clinical manifestations of these conditions, as well as details about testing were reviewed. The results of this testing were negative, which greatly reduces, but cannot eliminate, the chance for him to be a carrier for these conditions or have a child with these conditions.   The residual chance for him to be a carrier is 1 in 5 and the chance for this couple to have a child with CF is estimated to be 1 in 3,524. See the [employer] report for details and residual risk information.   If Ms. Level has any questions or concerns, we may be reached at [phone].  Cherly Anderson, MS, CGC

## 2023-07-06 NOTE — Progress Notes (Unsigned)
   PRENATAL VISIT NOTE  Subjective:  Adriana Dawson is a 37 y.o. G2P0010 at [redacted]w[redacted]d being seen today for ongoing prenatal care.  She is currently monitored for the following issues for this low-risk pregnancy and has Supervision of other normal pregnancy, antepartum; Elevated hemoglobin A1c; Cystic fibrosis carrier; and AMA (advanced maternal age) multigravida 35+ on their problem list.  Patient reports {sx:14538}.   .  .   . Denies leaking of fluid.   The following portions of the patient's history were reviewed and updated as appropriate: allergies, current medications, past family history, past medical history, past social history, past surgical history and problem list.   Objective:  There were no vitals filed for this visit.  Fetal Status:           General:  Alert, oriented and cooperative. Patient is in no acute distress.  Skin: Skin is warm and dry. No rash noted.   Cardiovascular: Normal heart rate noted  Respiratory: Normal respiratory effort, no problems with respiration noted  Abdomen: Soft, gravid, appropriate for gestational age.        Pelvic: Cervical exam deferred        Extremities: Normal range of motion.     Mental Status: Normal mood and affect. Normal behavior. Normal judgment and thought content.   Assessment and Plan:  Pregnancy: G2P0010 at [redacted]w[redacted]d 1. Multigravida of advanced maternal age in second trimester LR NIPS  2. Cystic fibrosis carrier ***  3. Elevated hemoglobin A1c Early 2 hr normal.   4. Supervision of other normal pregnancy, antepartum Declines doing 28w labs today. Has had limited [facility] (2-Rh) - last seen in July. Will come back to do 28w labs.  Offered flu vaccine - pt ***  5. Pregnancy with 26 completed weeks gestation   {Blank single:19197::"Term","Preterm"} labor symptoms and general obstetric precautions including but not limited to vaginal bleeding, contractions, leaking of fluid and fetal movement were reviewed in detail with the patient. Please  refer to After Visit Summary for other counseling recommendations.   No follow-ups on file.  Future Appointments  Date Time Provider Department Center  07/07/2023  8:50 AM Milas Hock, MD [facility]  07/25/2023  8:50 AM Lesly Dukes, MD [facility]  08/19/2023  3:30 PM [facility] US5 [facility]    Milas Hock, MD

## 2023-07-07 ENCOUNTER — Ambulatory Visit (INDEPENDENT_AMBULATORY_CARE_PROVIDER_SITE_OTHER): Payer: Commercial Managed Care - PPO | Admitting: Obstetrics and Gynecology

## 2023-07-07 ENCOUNTER — Encounter: Payer: Self-pay | Admitting: Obstetrics and Gynecology

## 2023-07-07 VITALS — BP 113/79 | HR 80 | Wt 175.0 lb

## 2023-07-07 DIAGNOSIS — Z3A26 26 weeks gestation of pregnancy: Secondary | ICD-10-CM

## 2023-07-07 DIAGNOSIS — Z141 Cystic fibrosis carrier: Secondary | ICD-10-CM

## 2023-07-07 DIAGNOSIS — R7309 Other abnormal glucose: Secondary | ICD-10-CM

## 2023-07-07 DIAGNOSIS — O09522 Supervision of elderly multigravida, second trimester: Secondary | ICD-10-CM

## 2023-07-07 DIAGNOSIS — F32A Depression, unspecified: Secondary | ICD-10-CM

## 2023-07-07 DIAGNOSIS — Z3480 Encounter for supervision of other normal pregnancy, unspecified trimester: Secondary | ICD-10-CM

## 2023-07-07 DIAGNOSIS — O219 Vomiting of pregnancy, unspecified: Secondary | ICD-10-CM

## 2023-07-07 DIAGNOSIS — F419 Anxiety disorder, unspecified: Secondary | ICD-10-CM

## 2023-07-07 MED ORDER — BUSPIRONE HCL 10 MG PO TABS: 20.0000 mg | ORAL_TABLET | Freq: Three times a day (TID) | ORAL | 2 refills | Status: AC

## 2023-07-07 MED ORDER — ONDANSETRON 4 MG PO TBDP
4.0000 mg | ORAL_TABLET | Freq: Four times a day (QID) | ORAL | 2 refills | Status: DC | PRN
Start: 2023-07-07 — End: 2023-09-02

## 2023-07-20 ENCOUNTER — Encounter: Payer: Self-pay | Admitting: Obstetrics and Gynecology

## 2023-07-20 ENCOUNTER — Ambulatory Visit (INDEPENDENT_AMBULATORY_CARE_PROVIDER_SITE_OTHER): Payer: Commercial Managed Care - PPO | Admitting: Obstetrics and Gynecology

## 2023-07-20 ENCOUNTER — Ambulatory Visit (INDEPENDENT_AMBULATORY_CARE_PROVIDER_SITE_OTHER): Payer: Commercial Managed Care - PPO

## 2023-07-20 VITALS — BP 116/79 | HR 71 | Wt 179.0 lb

## 2023-07-20 DIAGNOSIS — Z23 Encounter for immunization: Secondary | ICD-10-CM

## 2023-07-20 DIAGNOSIS — F32A Depression, unspecified: Secondary | ICD-10-CM

## 2023-07-20 DIAGNOSIS — Z3483 Encounter for supervision of other normal pregnancy, third trimester: Secondary | ICD-10-CM

## 2023-07-20 DIAGNOSIS — Z3480 Encounter for supervision of other normal pregnancy, unspecified trimester: Secondary | ICD-10-CM

## 2023-07-20 DIAGNOSIS — R7309 Other abnormal glucose: Secondary | ICD-10-CM

## 2023-07-20 DIAGNOSIS — Z3482 Encounter for supervision of other normal pregnancy, second trimester: Secondary | ICD-10-CM

## 2023-07-20 DIAGNOSIS — Z141 Cystic fibrosis carrier: Secondary | ICD-10-CM

## 2023-07-20 DIAGNOSIS — Z3A27 27 weeks gestation of pregnancy: Secondary | ICD-10-CM

## 2023-07-20 DIAGNOSIS — F419 Anxiety disorder, unspecified: Secondary | ICD-10-CM

## 2023-07-20 DIAGNOSIS — Z3A28 28 weeks gestation of pregnancy: Secondary | ICD-10-CM

## 2023-07-20 DIAGNOSIS — O09523 Supervision of elderly multigravida, third trimester: Secondary | ICD-10-CM

## 2023-07-20 NOTE — Progress Notes (Signed)
   PRENATAL VISIT NOTE  Subjective:  Adriana Dawson is a 37 y.o. G2P0010 at [redacted]w[redacted]d being seen today for ongoing prenatal care.  She is currently monitored for the following issues for this low-risk pregnancy and has Supervision of other normal pregnancy, antepartum; Elevated hemoglobin A1c; Cystic fibrosis carrier; AMA (advanced maternal age) multigravida 35+; and Anxiety and depression on their problem list.  Patient reports  hand tingling .  Contractions: Not present. Vag. Bleeding: None.  Movement: Present. Denies leaking of fluid.   The following portions of the patient's history were reviewed and updated as appropriate: allergies, current medications, past family history, past medical history, past social history, past surgical history and problem list.   Objective:   Vitals:   07/20/23 0834  BP: 116/79  Pulse: 71  Weight: 179 lb (81.2 kg)    Fetal Status: Fetal Heart Rate (bpm): 140 Fundal Height: 28 cm Movement: Present     General:  Alert, oriented and cooperative. Patient is in no acute distress.  Skin: Skin is warm and dry. No rash noted.   Cardiovascular: Normal heart rate noted  Respiratory: Normal respiratory effort, no problems with respiration noted  Abdomen: Soft, gravid, appropriate for gestational age.  Pain/Pressure: Absent     Pelvic: Cervical exam deferred        Extremities: Normal range of motion.  Edema: None  Mental Status: Normal mood and affect. Normal behavior. Normal judgment and thought content.   Assessment and Plan:  Pregnancy: G2P0010 at [redacted]w[redacted]d 1. Pregnancy with 28 completed weeks gestation  2. Supervision of other normal pregnancy, antepartum TDAP today 28 wk labs today Hand tingling. Reviewed likely carpal tunnel. She will try wrist splints. If this doesn't work - refer to [facility] for injections.   3. Elevated hemoglobin A1c Early 2 hr wnl  4. Cystic fibrosis carrier [name] negative  5. Anxiety and depression Lexapro and buspar.   6.  Multigravida of advanced maternal age in third trimester Continue serial grwoth - next due on 11/22.   Preterm labor symptoms and general obstetric precautions including but not limited to vaginal bleeding, contractions, leaking of fluid and fetal movement were reviewed in detail with the patient. Please refer to After Visit Summary for other counseling recommendations.   Return in about 2 weeks (around 08/03/2023) for OB VISIT, MD or APP.  Future Appointments  Date Time Provider Department Center  07/20/2023  8:50 AM Kathie Dike, CMA [facility]  07/20/2023  9:10 AM Milas Hock, MD [facility]  08/02/2023 10:50 AM Rasch, Harolyn Rutherford, NP [facility]  08/19/2023  3:30 PM [facility] US5 [facility]    Milas Hock, MD

## 2023-07-20 NOTE — Patient Instructions (Signed)
[place] ([id number]) [facility] Cox, MD; Earlene Plater, MD; Jamesetta Orleans, MD; Alvera Novel, MD; Rana Snare, MD; [facility], MD; [place], MD; Hosie Poisson, MD; Mayford Knife, MD [address], [place], [place] [address] [phone] Mon-Fri 8:30-5:00, closed for lunch 12:30-1:30; Sat-Sun 10:00-1:00 Accepting Newborns with commercial insurance only, must call prior to delivery to be accepted into  practice.  Medicaid - no, Tricare - yes   [facility] [address] [place], [place] [address] [phone] Mon to Fri 8am to 10pm, Sat 8am to 1pm (virtual only on weekends) Only accepts Medicaid Healthy Blue pts  [facility] ([facility]) - Pediatrics at Elige Radon, MD; Sabino Dick, MD; Quitman Livings, MD; Betha Loa, NP; Claretha Cooper, MD; Lelon Perla, MD [address] [place], [place] [address] [phone] Mon-Fri 8:30-5:30 Medicaid - yes, Tricare - yes  [place] [phone]) [facility] Marcie Mowers, MD [address], [place], [place] [address] [phone] Iona Hansen, Wed Fri 8:30-5:00, Sat 8:30-12:00, Closed Thursdays Accepting siblings of established patients and first time mom's if you call prenatally Medicaid- yes; Tricare - yes  [facility] at Lutricia Feil, [place]; Tracie Harrier, MD; Rusty Aus; [place], PA; Wynelle Link, MD; Azucena Cecil, MD;  [address] [place], [place] [address] [phone] Mon-Fri 8:30-5:00, closed for lunch 1-2 Only accepting newborns of established patients Medicaid- no; Tricare - yes  [facility] [phone]) [facility] Morene Crocker, MD; [address], [place], [place] [address] [phone] Mon-Fri 8:00-5:00 Medicaid - No; Tricare - Yes  [facility], [place]; [place], [place] [address], [place], [place] [address] [phone] Mon-Fri 8:00-5:00 Medicaid - No, Tricare - Yes  [facility] Cardell Peach, MD; Nash Dimmer,  MD; [place], [place] [address] [place], [place] [address] [phone]  Mon-Fri 8:00-5:00 Medicaid - No; Tricare - Yes  [facility] [address] [place], [place] [address] [phone] Mon-Fri 8:30-5:00 (lunch 12:00-1:00) Medicaid -Yes; Tricare - Yes  [facility] [place], MD [address] [place], [place], [place] [address] [phone] Mon-Fri 8:00-5:00 Seeing newborns of current patients only. No new patients Medicaid - No, Tricare - yes  Nature conservation officer at [place] [address] MD [address] [place], [place] [address] [phone] Mon-Fri 8:00-5:00 Medicaid -yes as secondary coverage only; Tricare - yes  [facility] [place], [place]; [place], [place]; Avis Epley, MD; Vonna Kotyk, MD; Clance Boll, MD; [place], [place]; [place], NP; Vaughan Basta, MD; [place], MD [address] [place], [place] [address] [phone] Mon-Fri 8:30-5:00, Sat 9:00-11:00 Accepts commercial insurance ONLY. Offers free prenatal information sessions for families. Medicaid - No, Tricare - Call first  [facility] [place], MD; [place], [place]; [place], [place]; [place], [place] [address] [place] [address] [phone] Mon-Fri 7:30-5:30 Medicaid - Yes; Tricare - yes  [place] [phone] & [phone])  Adult nurse HealthCare at [facility] [address] [place], [place] [address] [phone] Mon-Fri 8:00-5:00 Medicaid - no; Tricare - yes  [facility] [place], MD; [place], [place]; [place], [place] [address], [place], [place] [address] [phone] Mon-Fri 8:00-5:00 Medicaid- yes; Tricare - yes  [facility] - Ardeen Jourdain, MD; Yetta Barre, NP; [place], [place] [address] [place], [place], [place] [address] [phone] Mon-Fri 8:00-5:00 Medicaid - Yes; Tricare - yes  [address] [place] [phone])  [facility] Alfredo Bach,  [place]; [place], [place]; Eddie Candle, MD; Normand Sloop, MD; [place], NP; [place], DO; Daphine Deutscher, [place]; Constance Goltz, MD; Ruthann Cancer, MD; Vear Clock,  MD; Pennie Banter, [place]; [place], [place]; [place], [place] [address] [facility] [address], [place], [place] [address] [phone] Mon-Fri 8:30-5:00, Sat 9:00-12:00 - sick only Please register online [email or web address] then schedule online or call office Medicaid-Yes; Tricare -yes  [facility] - Premier  [place], MD; Romualdo Bolk, MD; [place], MD; [place], NP; [place], [place]; Antonietta Barcelona, MD; Mayford Knife, NP; Shelva Majestic, MD [address], [place], [place] [address] [phone] Mon-Fri 8:00-5:30, Sat&Sun by appointment (phones open at 8:30) Medicaid - Yes; Tricare - yes  [place] [phone] & [phone]) [facility] Mariel Aloe; [place], MD; Roger Shelter, MD; Arvilla Market, NP; [place], DO [address], [place], [place] [address] [phone] M-F 8:00 - 5:15, Sat/Sun 9-12 sick visits only Medicaid - No; Tricare - yes  [facility] Medicine  [place], PA-C; [place], PA-C; [place], DO; [place], PA-C; [place], PA-C; Roselyn Bering, MD [address] [place], [place] [address] [phone] Mon-Thur 8:00-7:00, Fri 8:00-5:00 Accepting Medicaid for 13 and under only   [facility] - Family Medicine at [place] [facility] [place]) [place], [place]; List, FNP; Berneda Rose, MD; Luther Redo, PA-C; Lavonia Drafts, MD; Kellie Simmering, FNP; Genevie Cheshire, FNP; Evaristo Bury, MD; Berneda Rose, MD [phone] [address], [place], [place] [address] [phone] Mon-Fri 8:30-5:30 Medicaid - Yes; Tricare - yes  [facility] - [address]  [place], [place]; Whitney Post, MD; Hennie Duos, MD; Wynne Dust, MD; Briar Chapel, NP [address], [place], [place] [address] [phone] Mon-Thur 8:00-5:30, Fri 8:00-5:00, Sat 9:00-12:00 Medicaid - yes, Tricare - yes  [place] [phone])  [facility], [place]; Lenise Arena, MD; [place], [place] [address], [place], [place] [address] [phone] Mon-Fri 8:00-5:00, closed for lunch 12-1 Medicaid - No; Tricare - yes  Nature conservation officer at [facility], MD [address] [place], [place] [address] [phone] Mon-Fri 8:00-5:00 Medicaid - No; Tricare - yes  [facility] - [facility] [place], MD; Tami Ribas, MD; Mariam Dollar, MD; Yetta Barre, MD [address], [place], [place] [address] [phone] Mon-Fri 8:00-5:00 Medicaid- Yes; Tricare - yes  [facility] Pediatric Providers  [facility] [place], [place] [address] [place], [place], [place] [address] [phone] M-W: 8:00-5:00, Thur: 8:00 - 7:00, Fri: 8:00 - noon Medicaid - yes; Tricare - yes  [facility] - Quay Burow, FNP [address] [place], [place] [address] [phone] M-F 8:00 - 5:00, Closed for lunch 12-1 Medicaid - yes; Tricare - yes  [facility] Pediatric Providers  [facility] Primary Care at [place], [place], Alinda Money, MD, [place], FNP-C [address], [facility], [address], [place], [place] [address] [phone] M-T 8:00-5:00, Wed-Fri 7:00-6:00 Medicaid - Yes; Tricare -yes  [facility] at [facility], DO; [address] [place], [place] [address] [phone] M-F 8:00 - 5:00, closed for lunch 12-1 Medicaid - Yes; Tricare - yes  [facility] and Internal Medicine  Zachery Dauer, MD; Gladstone Lighter, MD; Collie Siad, MD; Freda Jackson, MD; Rich Number, MD; Darryl Nestle, MD; Melinda Crutch, MD, Audria Nine, MD; Tawanna Cooler, MD; Steffanie Dunn, MD; Byrd Hesselbach, MD; Lucretia Roers, MD [address] [place], [place] [address] [phone] M-F 8:00-5:00 Medicaid - yes; Tricare - yes  [facility], MD (speaks [place] and Hindi) [address] [place], [place] [address] [phone] M-F: 8:30 - 5:00, closed 12:30 - 1 for lunch Medicaid - Yes; Tricare -yes  [facility] Pediatric Providers  [facility] Shanda Bumps, MD; Chanetta Marshall, MD;  Laurell Josephs, MD [address], [place], [place] [address] [phone] M-Th: 8:00 - 5:30, Fri: 8:00 - 12:00 Medicaid - yes; Tricare - yes  [facility] at [facility], NP; Thora Lance, MD; Orrin Brigham, MD [id number] [place], [place] [address] [phone] M-F: 8:00 - 5:00 Medicaid - yes; Tricare - yes  [facility] [place], NP; Orson Slick, NP; Salley Scarlet, NP; Linton Flemings, MD; Mayford Knife, MD, [place], NP, Emelda Fear, MD; [place], Brun [address] [place], [place] [address] [phone] M-F: 8:30 - 5:30p Medicaid - yes; Tricare - yes Other locations available as well  [facility], MD; Andrey Campanile, MD; Neville Route, PA-C [address] [place] [address] [place], [place] [address] [phone] M-W: 8:00am - 7:00pm, Thurs: 8:00am - 8:00pm; Fri: 8:00am - 5:00pm, closed daily from 12-1 for lunch Medicaid - yes; Tricare - yes  [facility] Pediatric Providers  [facility] Pediatrics at Levin Erp, MD; Aggie Cosier, FNP; Bland Span, MD; Tristan Schroeder, MD; [place], PNP; Alesia Banda; [place], [place]; Julian Reil, MD;  [address], [place], [place] [address] [phone] Judie Petit - Caleen Essex: 8am - 5pm, Sat 9-noon Medicaid - Yes; Tricare -yes  [facility] at Jaclynn Guarneri, MD; Yetta Barre, FNP; Lilian Kapur, MD; Mariam Dollar, MD [address] [place], [id number] [phone] M-F 8:00 - 5:00 Medicaid - call; Tricare - yes  [facility]- Cruz Condon, MD; [place], [place]; Delora Fuel, MD; Dareen Piano, MD; Trudee Grip, MD; Kizzie Ide, MD; Zebedee Iba; Birdena Crandall, MD; Hinton Dyer, MD; [place], MD [address], [place], [place] [address] [phone] M-F 8:00am - 5:00pm; Sat. 9:00 - 11:00 Medicaid - yes; Tricare - yes  [facility] at [facility], MD [address], [place], [place] [address] [phone] M-F 8:00 - 5:00 Medicaid - [place] Medicaid only; Tricare - yes  [facility]  Pediatrics - Illene Bolus, MD; Earlene Plater, [place]; Kenyon Ana, MD [address], [place], [place] [address] [phone] M-F 8:00 - 5:00 Medicaid - yes; Tricare - yes  [facility] [address] Pediatrics - Lind Covert, MD; Manson Passey, MD, [facility], MD, [place], MD; [place], MD; Katrinka Blazing, MD; [address] [place], [place] [address] [phone] M-F: 8-5 Medicaid - yes; Tricare - yes  [facility] - Henrietta Hoover, [place]; [place], MD; [address] [place], [place] [address] [phone] M-F 8-5 Medicaid - yes; Tricare - yes  [address] [place] Darrol Poke, MD; Tami Ribas, MD; [place], MD; [place], DNP; [place], PNP [address], [place], [place] [address] [phone] M-F 8-5 Medicaid - yes; Tricare - yes  [facility] Internal Medicine and Pediatrics Delories Heinz, MD; Adrienne Mocha; Ala Bent, MD [address] [place], [place] [address] [phone] M-F 7am - 5 pm Medicaid - call; Tricare - yes  [facility] [place], [place]; Fredia Beets, MD; Roxan Hockey, MD [address] [place], [place] [address] [phone] M-F 8-5 Medicaid - yes; Tricare - yes  [facility] Kae Heller, MD; Sheliah Hatch, MD; Mayford Knife, FNP; Shon Baton, FNP; Tyron Russell, FNP; Ishmael Holter; [facility] - FNP [address] [place], [place] [address] [phone] M-F 8-5 Medicaid- yes; Tricare - yes  [facility] - Betsy Coder, [place] and [place] Guest, MD; Terrial Rhodes, MD; Hulda Humphrey, MD; Roseanne Reno, MD; [place], [place]; Ala Dach, MD; Fredia Beets, MD; Dimple Casey, MD [address] [place], [place] [address] [phone] M-F: 8-5, Sat: 9-4, Sun 9-12 Medicaid - yes; Tricare - yes  [facility] [place], PNP; Earlene Plater, PNP [address] [place], [place] [address] [phone] M-F 8 - 5, closed 12-1 for lunch Medicaid - yes; Tricare - yes  [facility]  [facility] Kathyrn Lass, MD; Hal Neer, MD; Dimple Casey, MD; Tina, DO [address] [place] [address], [place], [place] [address] [phone] M-F 8- 5:30 Medicaid - yes; Tricare - yes  [facility] Pediatrics - Biagio Quint, MD; Rosalia Hammers, MD; Gwenith Daily, MD [address] [place], [place] [address] [phone] Judie Petit: Nicholas Lose; Tues-Fri: 8-5; Sat: 9-12 Medicaid - yes; Tricare - yes  [facility] Pediatrics - Bobbye Morton, MD; Daphane Shepherd, MD; Chestine Spore, MD; Haskell Riling, MD; Kate Sable, MD [address], [place], [place] [address] [phone] Judie PetitMarland Kitchen Nicholas LoseFrancee Nodal: 8-5; Sat: 8:30-12:30 Medicaid - yes; Tricare - yes  Olena Heckle [facility] Pediatrics - Beckey Rutter, MD; [place], [place] [address], [place], [place] [address] [phone] Mon-Fri: 8-5 Medicaid - yes; Tricare - yes  [facility] - [place], CPNP; [place], [place]; Dimple Casey, MD; Alisa Graff, MD; Cephus Shelling, MD; [address] [place], [place] [address] [phone] M-F: 8-5, closed 1-2 for lunch Medicaid - yes; Tricare - yes  [facility] - [facility] [place], [place]; [place], [place]; Katrinka Blazing, MD; [place], CPNP; [place], [place]; [place], MD; Earlene Plater, MD [address], [place], [place] [address] [phone] M-Thurs: Nicholas Lose; Fri: 8-6; Sat: 9-12; Sun 2-4 Medicaid - yes; Tricare - yes  [facility] Georgeanna Lea, MD; Evette Cristal, MD; Shea Stakes, FNP; Earney Mallet, DO; [address] [place], [place] [address] [phone] M-F: 8-5 Medicaid - yes; Tricare - yes  [facility] Pediatric Providers  [facility] Family Medicine -Collene Mares, MD; [place], NP [address], [place], [place] [address] [phone] M - Fri: 8am - 5pm, closed for lunch 12-1 Medicaid - Yes;  Tricare - yes  [facility] and Pediatrics Elinor Parkinson, MD; Victory Dakin, MD; [place], DO; Vinocur, MD;Hall, PA; Clent [place], [place]; Orvan Falconer, NP [phone] [address] [place], [place] [address] [phone] M-F 8:00 - 5:00, Sat 8:00 - 11:30 Medicaid - yes; Tricare - yes  [facility] Welton Flakes, MD; [place], MD, [address] MD, [place], MD, Mead, MD; Violet, NP; [place], [place];  [address] [place], [place] [address] [phone] M-F 8:10am - 5:00pm Medicaid - yes; Tricare - yes  [facility] Eustaquio Boyden, MD; [place], NP [address], [place], [place] [address] [phone] M-F 8:00 - 5:00 Medicaid - [place] Medicaid only; Tricare - yes  [facility] Medicine - Deep [address] [place], MD; [place], NP [address] [place] [address], [place], [place] [address] [phone] M-F 8:00 - 5:00; Closed for lunch 12 - 1:00 Medicaid - yes; Tricare - yes  [facility] Belva Crome, MD; Jonita Albee, FNP [address] [place], [place] [address] [phone] Mon 9-5; Tues/Wed 10-5; Thurs 8:30-5; Fri: 8-12:30 Medicaid - yes; Tricare - yes [place] [place], MD; Nadine Counts, DO; [place], NP; Daphine Deutscher, NP; Lequita Halt, NP; Ellamae Sia, NP; Reginia Forts, NP; Darlyn Read, MD; [place], [place] [address] [place], [place] [address] [phone] M-F 8:00 - 5:00 Medicaid - yes; Tricare - yes  [facility], FNP-C; [place], FNP-C [address] Ermalinda Memos [place], [place] [address] [phone] M, W, R 8:00-5:00, Tues: 8:00am - 7:00pm; Fri 8:00 - noon  Medicaid - Yes; Tricare - yes  [facility], MD [address] [place], [place] [address] [phone]  M-Thurs 8:30-5:30, Fri: 8:30-12:30pm Medicaid - Yes; Tricare - N

## 2023-07-20 NOTE — Progress Notes (Signed)
Pt here for GTT. Pt given lab orders and went to the lab.

## 2023-07-21 LAB — CBC
Hematocrit: 37.6 % (ref 34.0–46.6)
Hemoglobin: 12.7 g/dL (ref 11.1–15.9)
MCH: 30.3 pg (ref 26.6–33.0)
MCHC: 33.8 g/dL (ref 31.5–35.7)
MCV: 90 fL (ref 79–97)
Platelets: 158 10*3/uL (ref 150–450)
RBC: 4.19 x10E6/uL (ref 3.77–5.28)
RDW: 13.4 % (ref 11.7–15.4)
WBC: 8.1 10*3/uL (ref 3.4–10.8)

## 2023-07-21 LAB — GLUCOSE TOLERANCE, 2 HOURS W/ 1HR
Glucose, 1 hour: 165 mg/dL (ref 70–179)
Glucose, 2 hour: 139 mg/dL (ref 70–152)
Glucose, Fasting: 78 mg/dL (ref 70–91)

## 2023-07-21 LAB — HIV ANTIBODY (ROUTINE TESTING W REFLEX): HIV Screen 4th Generation wRfx: NONREACTIVE

## 2023-07-21 LAB — RPR: RPR Ser Ql: NONREACTIVE

## 2023-07-25 ENCOUNTER — Encounter: Payer: Commercial Managed Care - PPO | Admitting: Obstetrics & Gynecology

## 2023-08-01 ENCOUNTER — Telehealth: Payer: Self-pay | Admitting: *Deleted

## 2023-08-01 NOTE — Telephone Encounter (Signed)
Patient advised to speak to provider during her scheduled OB visit on 08/02/2023 about carpal tunnel.

## 2023-08-02 ENCOUNTER — Ambulatory Visit (INDEPENDENT_AMBULATORY_CARE_PROVIDER_SITE_OTHER): Payer: Commercial Managed Care - PPO | Admitting: Obstetrics and Gynecology

## 2023-08-02 VITALS — BP 113/78 | HR 89 | Wt 181.0 lb

## 2023-08-02 DIAGNOSIS — R309 Painful micturition, unspecified: Secondary | ICD-10-CM | POA: Diagnosis not present

## 2023-08-02 DIAGNOSIS — Z3A30 30 weeks gestation of pregnancy: Secondary | ICD-10-CM

## 2023-08-02 DIAGNOSIS — Z3483 Encounter for supervision of other normal pregnancy, third trimester: Secondary | ICD-10-CM

## 2023-08-02 DIAGNOSIS — Z3480 Encounter for supervision of other normal pregnancy, unspecified trimester: Secondary | ICD-10-CM

## 2023-08-02 MED ORDER — FAMOTIDINE 20 MG PO TABS: 20.0000 mg | ORAL_TABLET | Freq: Two times a day (BID) | ORAL | 2 refills | Status: DC

## 2023-08-02 NOTE — Progress Notes (Signed)
   PRENATAL VISIT NOTE  Subjective:  Adriana Dawson is a 37 y.o. G2P0010 at [redacted]w[redacted]d being seen today for ongoing prenatal care.  She is currently monitored for the following issues for this low-risk pregnancy and has Supervision of other normal pregnancy, antepartum; Elevated hemoglobin A1c; Cystic fibrosis carrier; AMA (advanced maternal age) multigravida 35+; and Anxiety and depression on their problem list.  Patient reports carpal tunnel symptoms and heartburn and dysuria. Contractions: Not present. Vag. Bleeding: None.  Movement: Present. Denies leaking of fluid. She has been taking Tums without much relief for the heartburn. All foods seem like a trigger for her. She has tried the wrist splints at night and occasionally during the day when she can for her carpal tunnel symptoms. She has not had much relief from the braces and the numbness and tingling has gotten worse. 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    She has also been experiencing dysuria, increased frequency and urgency with urination. She has never had a UTI so she is unsure if that's what she is experiencing.   The following portions of the patient's history were reviewed and updated as appropriate: allergies, current medications, past family history, past medical history, past social history, past surgical history and problem list.   Objective:   Vitals:   08/02/23 1046  BP: 113/78  Pulse: 89  Weight: 181 lb (82.1 kg)    Fetal Status: Fetal Heart Rate (bpm): 150   Movement: Present   Fundal Height: 29 cm   General:  Alert, oriented and cooperative. Patient is in no acute distress.  Skin: Skin is warm and dry. No rash noted.   Cardiovascular: Normal heart rate noted  Respiratory: Normal respiratory effort, no problems with respiration noted  Abdomen: Soft, gravid, appropriate for gestational age.  Pain/Pressure: Absent     Pelvic: Cervical exam deferred        Extremities: Normal range of motion.   Edema: None  Mental Status: Normal mood and affect. Normal behavior. Normal judgment and thought content.   Assessment and Plan:  Pregnancy: G2P0010 at [redacted]w[redacted]d 1. Painful urination - Culture, OB Urine  2.  Supervision of other normal pregnancy, antepartum  - prescribed Pepcid for heartburn symptoms - referral to [facility] for injections for carpal tunnel symptoms, continue wearing braces.  - BP good today  - 8/30 ultrasound normal  -next ultrasound scheduled for 11/22 for growth   Preterm labor symptoms and general obstetric precautions including but not limited to vaginal bleeding, contractions, leaking of fluid and fetal movement were reviewed in detail with the patient. Please refer to After Visit Summary for other counseling recommendations.    Future Appointments  Date Time Provider Department Center  08/18/2023 10:10 AM Lennart Pall, MD [facility]  08/19/2023  3:30 PM [facility] US5 [facility]    Venia Carbon, NP

## 2023-08-03 ENCOUNTER — Ambulatory Visit (INDEPENDENT_AMBULATORY_CARE_PROVIDER_SITE_OTHER): Payer: Commercial Managed Care - PPO | Admitting: Sports Medicine

## 2023-08-03 ENCOUNTER — Other Ambulatory Visit: Payer: Self-pay

## 2023-08-03 VITALS — BP 126/84 | Ht 63.0 in | Wt 184.0 lb

## 2023-08-03 DIAGNOSIS — G5603 Carpal tunnel syndrome, bilateral upper limbs: Secondary | ICD-10-CM | POA: Diagnosis not present

## 2023-08-03 MED ORDER — METHYLPREDNISOLONE ACETATE 40 MG/ML IJ SUSP
40.0000 mg | Freq: Once | INTRAMUSCULAR | Status: AC
  Administered 2023-08-03: 40 mg via INTRA_ARTICULAR

## 2023-08-03 NOTE — Progress Notes (Unsigned)
PCP: Morrell Riddle, PA-C  SUBJECTIVE:   HPI:  Patient is a 37 y.o. female here with chief complaint of bilateral wrist and hand pain with numbness. She is [redacted] weeks pregnant and notes over the past 4-5 weeks she has had worsening pain in her wrists that radiates up her fingers, predominantly digits 1-4 but occasionally digit 5 bilaterally. Numbness and tingling has started including all the digits as well. She has tried bracing over the past month with only minor benefit. She works as a MA in the ICU and notes this is significantly impacting her functionality at work. Her pain worsens with grabbing things like when she is checking CBGs at work.  ROS:     See HPI  PERTINENT  PMH / PSH FH / / SH:  Past Medical, Surgical, Social, and Family History Reviewed & Updated in the EMR.  Pertinent findings include:  Pregnancy  Allergies  Allergen Reactions   Oseltamivir     6 years ago patient was given a medication for the flu(possibly tamiflu) and it made her face fell funny.   Other Other (See Comments)    6 years ago patient was given a medication for the flu(possibly tamiflu) and it made her face fell funny.   OBJECTIVE:  BP 126/84   Ht 5\' 3"  (1.6 m)   Wt 184 lb (83.5 kg)   LMP 12/26/2022 (Exact Date)   BMI 32.59 kg/m   PHYSICAL EXAM:  GEN: Alert and Oriented, NAD, comfortable in exam room RESP: Unlabored respirations, symmetric chest rise PSY: normal mood, congruent affect   MSK EXAM: Bilateral Wrists: Inspection: No deformity. Swelling noted in forearms and wrists bilaterally. No atrophy of thenar or hypothenar eminences. ROM: Full No reproducible TTP thoug pressure on the flexor retinaculum does reproduce her radiating pain and numbness. Strength slightly reduced with thumb flexion and opposition, grip strength is ok/ Finkelstein negative  Tinel's positive Phalens positive   ASSESSMENT & PLAN:  1. Bilateral carpal tunnel syndrome Persistent despite trial of conservative  management. We discussed pregnancy being a risk factor for this and that it will likely improve postpartum, however given her functional impairments we discussed trying cortisone injection under ultrasound which she is amenable to. These were performed as below. Recommended she continue bracing following injections. F/u as needed.  Procedure: Ultrasound guided bilateral carpal tunnel injection After discussion of the risks, benefits, and alternatives she agreed with injection today. Reviewed risks of  infection, allergic reaction, local tissue breakdown, skin hypopigmentation, systemic effects of corticosteroids, elevation of blood glucose, injury to soft tissue and/or nerves. Written consent obtained.  A procedural pause was conducted to verify:  correct patient identity, procedure to be performed and as applicable, correct side and site, and correct patient position. The patient was placed in a seated chair position with the arm placed on the procedure table in a supinated position with a small cushion placed under the dorsal carpus line, providing slight wrist extension.  Using a high frequency linear ultrasound probe the median nerve was visualized.  Care was made to determine a needle path that would avoid any vascular or neural tissue using color flow doppler as a visual aid.  The skin prepped with alcohol swabs. Then this area was re-examined using the same transducer with sterile gel and probe cover in the no-touch technique.  Using a 27G 1.5in needle and in-plane approach, 1mL of 1% lidocaine was injected along the projected needle path. Then, a 27G 1.5 in needle was advanced  using direct in-plane visualization with the needle tip in the target area and negative aspiration for blood, a mixture of 3.0 mL of normal saline, 1.0 mL of lidocaine 1%, and 1.0 mL of kenalog 40 mg/ml was injected both above and below the median nerve to hydro-dissect it from the surrounding tissue and provide complete coverage  of medication flowing around the nerve. Patient tolerated procedure well without immediate complications. The patient was counseled as to the expected post-injection course, including the possibility of worsening of pain with steroid flare. Instructed as to concerning symptoms and advised to contact the office if these should arise.    Glean Salen, MD PGY-4, Sports Medicine Fellow [facility]  Addendum:  Patient seen in the office by fellow.  His history, exam, plan of care were precepted with me.  Norton Blizzard MD Marrianne Mood

## 2023-08-03 NOTE — Patient Instructions (Signed)
You have carpal tunnel syndrome. Wear the wrist brace at nighttime and as often as possible during the day We did a steroid injection today. If not improving, will consider nerve conduction studies to assess severity. Follow up with me as needed.   Today you received an injection with a corticosteroid (aka: cortisone injection). This injection is usually done in response to pain and inflammation. There is some "numbing medicine" (Lidocaine) in the shot, so the injected area may be numb and feel really good for the next couple of hours. The numbing medicine usually wears off in 2-3 hours, and then your pain level may be back to where it was before the injection until the cortisone starts working.    The actually benefit from the steroid injection is usually noticed within 3-5 days, but may take up to 14 days. You may actually experience a small (as in 10%) INCREASE in pain in the first 24 hours---that is common.  Things to watch out for that you should contact us or a health care provider urgently would include: 1. Unusual (as in more than 10%) increase in pain 2. New fever > 101.5 3. New swelling or redness of the injected area. 4. Streaking of red lines around the area injected.  Do not hesitate to call or reach out with any questions or concerns.

## 2023-08-05 LAB — CULTURE, OB URINE

## 2023-08-05 LAB — URINE CULTURE, OB REFLEX: Organism ID, Bacteria: NO GROWTH

## 2023-08-11 ENCOUNTER — Other Ambulatory Visit (HOSPITAL_COMMUNITY): Payer: Self-pay

## 2023-08-18 ENCOUNTER — Ambulatory Visit (INDEPENDENT_AMBULATORY_CARE_PROVIDER_SITE_OTHER): Payer: Commercial Managed Care - PPO | Admitting: Obstetrics and Gynecology

## 2023-08-18 VITALS — BP 114/76 | HR 103 | Wt 183.0 lb

## 2023-08-18 DIAGNOSIS — Z3480 Encounter for supervision of other normal pregnancy, unspecified trimester: Secondary | ICD-10-CM

## 2023-08-18 DIAGNOSIS — Z3A32 32 weeks gestation of pregnancy: Secondary | ICD-10-CM

## 2023-08-18 DIAGNOSIS — R7309 Other abnormal glucose: Secondary | ICD-10-CM

## 2023-08-18 DIAGNOSIS — F419 Anxiety disorder, unspecified: Secondary | ICD-10-CM

## 2023-08-18 DIAGNOSIS — O09523 Supervision of elderly multigravida, third trimester: Secondary | ICD-10-CM

## 2023-08-18 DIAGNOSIS — Z141 Cystic fibrosis carrier: Secondary | ICD-10-CM

## 2023-08-18 DIAGNOSIS — F32A Depression, unspecified: Secondary | ICD-10-CM

## 2023-08-18 NOTE — Patient Instructions (Signed)
Indigestion:  Tums  Maalox  Mylanta  Zantac  Pepcid

## 2023-08-18 NOTE — Progress Notes (Signed)
   PRENATAL VISIT NOTE  Subjective:  Adriana Dawson is a 37 y.o. G2P0010 at [redacted]w[redacted]d being seen today for ongoing prenatal care.  She is currently monitored for the following issues for this low-risk pregnancy and has Supervision of other normal pregnancy, antepartum; Elevated hemoglobin A1c; Cystic fibrosis carrier; AMA (advanced maternal age) multigravida 35+; and Anxiety and depression on their problem list.  Patient reports  pressure with movement, difficulty catching breath when laying completely flat but fine when propped on pillow and with activity .  Contractions: Not present. Vag. Bleeding: None.  Movement: Present. Denies leaking of fluid.   The following portions of the patient's history were reviewed and updated as appropriate: allergies, current medications, past family history, past medical history, past social history, past surgical history and problem list.   Objective:   Vitals:   08/18/23 1007  BP: 114/76  Pulse: (!) 103  Weight: 183 lb (83 kg)    Fetal Status: Fetal Heart Rate (bpm): 134   Movement: Present     General:  Alert, oriented and cooperative. Patient is in no acute distress.  Skin: Skin is warm and dry. No rash noted.   Cardiovascular: Normal heart rate noted  Respiratory: Normal respiratory effort, no problems with respiration noted  Abdomen: Soft, gravid, appropriate for gestational age.  Pain/Pressure: Absent      Assessment and Plan:  Pregnancy: G2P0010 at [redacted]w[redacted]d 1. Supervision of other normal pregnancy, antepartum 2. [redacted] weeks gestation of pregnancy Growth [place] tomorrow Discussed GBS next appt  3. Elevated hemoglobin A1c Normal early & routine 2h  4. Cystic fibrosis carrier [name] negative  5. Multigravida of advanced maternal age in third trimester Growth [place] tomorrow ldASA  6. Anxiety and depression Doing well on buspar and lexapro  7. Carpal tunnel sundryome Still has pain but better with injections  Preterm labor symptoms and general  obstetric precautions including but not limited to vaginal bleeding, contractions, leaking of fluid and fetal movement were reviewed in detail with the patient. Please refer to After Visit Summary for other counseling recommendations.   Return in about 4 weeks (around 09/15/2023) for return OB at 36 weeks with GBS, GC/CT.  Future Appointments  Date Time Provider Department Center  08/19/2023  3:30 PM [facility] US5 [facility]  08/30/2023 10:10 AM Rasch, Harolyn Rutherford, NP [facility]  09/13/2023  9:30 AM Rasch, Harolyn Rutherford, NP [facility]  09/22/2023  9:30 AM Lennart Pall, MD [facility]    Lennart Pall, MD

## 2023-08-19 ENCOUNTER — Other Ambulatory Visit: Payer: Self-pay | Admitting: Obstetrics and Gynecology

## 2023-08-19 ENCOUNTER — Ambulatory Visit: Payer: Commercial Managed Care - PPO | Attending: Obstetrics and Gynecology

## 2023-08-19 ENCOUNTER — Ambulatory Visit: Payer: Commercial Managed Care - PPO | Admitting: *Deleted

## 2023-08-19 ENCOUNTER — Other Ambulatory Visit: Payer: Self-pay | Admitting: *Deleted

## 2023-08-19 DIAGNOSIS — O09522 Supervision of elderly multigravida, second trimester: Secondary | ICD-10-CM

## 2023-08-19 DIAGNOSIS — O365930 Maternal care for other known or suspected poor fetal growth, third trimester, not applicable or unspecified: Secondary | ICD-10-CM

## 2023-08-19 DIAGNOSIS — O285 Abnormal chromosomal and genetic finding on antenatal screening of mother: Secondary | ICD-10-CM | POA: Diagnosis not present

## 2023-08-19 DIAGNOSIS — Z3A32 32 weeks gestation of pregnancy: Secondary | ICD-10-CM

## 2023-08-19 DIAGNOSIS — Z141 Cystic fibrosis carrier: Secondary | ICD-10-CM | POA: Diagnosis not present

## 2023-08-19 DIAGNOSIS — O365990 Maternal care for other known or suspected poor fetal growth, unspecified trimester, not applicable or unspecified: Secondary | ICD-10-CM

## 2023-08-19 NOTE — Procedures (Signed)
Adriana Dawson 1986-03-12 [redacted]w[redacted]d  Fetus A Non-Stress Test Interpretation for 08/19/23  Indication: IUGR  Fetal Heart Rate A Mode: External Baseline Rate (A): 125 bpm Variability: Minimal, Moderate Accelerations: 15 x 15 Decelerations: None Multiple birth?: No  Uterine Activity Mode: Palpation, Toco Contraction Frequency (min): none Resting Tone Palpated: Relaxed  Interpretation (Fetal Testing) Nonstress Test Interpretation: Reactive Overall Impression: Reassuring for gestational age Comments: Dr. Judeth Cornfield reviewed tracing

## 2023-08-24 ENCOUNTER — Ambulatory Visit: Payer: Commercial Managed Care - PPO | Admitting: *Deleted

## 2023-08-24 ENCOUNTER — Ambulatory Visit: Payer: Commercial Managed Care - PPO | Attending: Obstetrics and Gynecology

## 2023-08-24 ENCOUNTER — Other Ambulatory Visit: Payer: Self-pay | Admitting: *Deleted

## 2023-08-24 DIAGNOSIS — Z141 Cystic fibrosis carrier: Secondary | ICD-10-CM

## 2023-08-24 DIAGNOSIS — O285 Abnormal chromosomal and genetic finding on antenatal screening of mother: Secondary | ICD-10-CM

## 2023-08-24 DIAGNOSIS — O365990 Maternal care for other known or suspected poor fetal growth, unspecified trimester, not applicable or unspecified: Secondary | ICD-10-CM | POA: Diagnosis not present

## 2023-08-24 DIAGNOSIS — Z3A33 33 weeks gestation of pregnancy: Secondary | ICD-10-CM | POA: Diagnosis not present

## 2023-08-24 DIAGNOSIS — O09523 Supervision of elderly multigravida, third trimester: Secondary | ICD-10-CM | POA: Diagnosis not present

## 2023-08-24 NOTE — Procedures (Signed)
Adriana Dawson [birth year] [redacted]w[redacted]d  Fetus A Non-Stress Test Interpretation for 08/24/23  Indication: IUGR  Fetal Heart Rate A Mode: External Baseline Rate (A): 120 bpm Variability: Moderate Accelerations: 15 x 15 Decelerations: None Multiple birth?: No  Uterine Activity Mode: Palpation, Toco Contraction Frequency (min): none Resting Tone Palpated: Relaxed  Interpretation (Fetal Testing) Nonstress Test Interpretation: Reactive Overall Impression: Reassuring for gestational age Comments: Dr. Grace Bushy reviewed tracing

## 2023-08-26 ENCOUNTER — Inpatient Hospital Stay (HOSPITAL_COMMUNITY)
Admission: AD | Admit: 2023-08-26 | Discharge: 2023-08-26 | Disposition: A | Discharge status: Home / Self Care | Payer: Commercial Managed Care - PPO | Attending: Obstetrics and Gynecology | Admitting: Obstetrics and Gynecology

## 2023-08-26 ENCOUNTER — Encounter (HOSPITAL_COMMUNITY): Payer: Self-pay | Admitting: Obstetrics and Gynecology

## 2023-08-26 DIAGNOSIS — O1493 Unspecified pre-eclampsia, third trimester: Secondary | ICD-10-CM | POA: Diagnosis not present

## 2023-08-26 DIAGNOSIS — O365990 Maternal care for other known or suspected poor fetal growth, unspecified trimester, not applicable or unspecified: Secondary | ICD-10-CM | POA: Diagnosis present

## 2023-08-26 DIAGNOSIS — O09523 Supervision of elderly multigravida, third trimester: Secondary | ICD-10-CM | POA: Insufficient documentation

## 2023-08-26 DIAGNOSIS — Z3A33 33 weeks gestation of pregnancy: Secondary | ICD-10-CM | POA: Insufficient documentation

## 2023-08-26 DIAGNOSIS — O1413 Severe pre-eclampsia, third trimester: Secondary | ICD-10-CM

## 2023-08-26 DIAGNOSIS — O1400 Mild to moderate pre-eclampsia, unspecified trimester: Secondary | ICD-10-CM | POA: Diagnosis present

## 2023-08-26 DIAGNOSIS — O365930 Maternal care for other known or suspected poor fetal growth, third trimester, not applicable or unspecified: Secondary | ICD-10-CM

## 2023-08-26 LAB — COMPREHENSIVE METABOLIC PANEL
ALT: 20 U/L (ref 0–44)
AST: 24 U/L (ref 15–41)
Albumin: 3 g/dL — ABNORMAL LOW (ref 3.5–5.0)
Alkaline Phosphatase: 92 U/L (ref 38–126)
Anion gap: 8 (ref 5–15)
BUN: 12 mg/dL (ref 6–20)
CO2: 18 mmol/L — ABNORMAL LOW (ref 22–32)
Calcium: 8.8 mg/dL — ABNORMAL LOW (ref 8.9–10.3)
Chloride: 108 mmol/L (ref 98–111)
Creatinine, Ser: 0.89 mg/dL (ref 0.44–1.00)
GFR, Estimated: >60 mL/min (ref >60)
Glucose, Bld: 125 mg/dL — ABNORMAL HIGH (ref 70–99)
Potassium: 3.9 mmol/L (ref 3.5–5.1)
Sodium: 134 mmol/L — ABNORMAL LOW (ref 135–145)
Total Bilirubin: 0.6 mg/dL (ref <1.2)
Total Protein: 6.4 g/dL — ABNORMAL LOW (ref 6.5–8.1)

## 2023-08-26 LAB — CBC
HCT: 38.6 % (ref 36.0–46.0)
Hemoglobin: 13.9 g/dL (ref 12.0–15.0)
MCH: 30.9 pg (ref 26.0–34.0)
MCHC: 36 g/dL (ref 30.0–36.0)
MCV: 85.8 fL (ref 80.0–100.0)
Platelets: 171 10*3/uL (ref 150–400)
RBC: 4.5 MIL/uL (ref 3.87–5.11)
RDW: 12.7 % (ref 11.5–15.5)
WBC: 8.8 10*3/uL (ref 4.0–10.5)
nRBC: 0 % (ref 0.0–0.2)

## 2023-08-26 LAB — URINALYSIS, ROUTINE W REFLEX MICROSCOPIC
Bilirubin Urine: NEGATIVE
Glucose, UA: NEGATIVE mg/dL
Hgb urine dipstick: NEGATIVE
Ketones, ur: 5 mg/dL — AB
Nitrite: NEGATIVE
Protein, ur: 100 mg/dL — AB
Specific Gravity, Urine: 1.018 (ref 1.005–1.030)
WBC, UA: >50 WBC/hpf (ref 0–5)
pH: 5 (ref 5.0–8.0)

## 2023-08-26 LAB — PROTEIN / CREATININE RATIO, URINE
Creatinine, Urine: 147 mg/dL
Protein Creatinine Ratio: 0.62 mg/mg{creat} — ABNORMAL HIGH (ref 0.00–0.15)
Total Protein, Urine: 91 mg/dL

## 2023-08-26 NOTE — Discharge Instructions (Addendum)
Please check your blood pressure at least once daily. If your blood pressure is >155 (top number) or >105 (bottom number), please return to the maternity assessment unit If you develop severe headaches that do not resolve with medication (Tylenol or Excedrin Tension), vision changes, chest pain, shortness of breath, pain in the right upper part of your abdomen, or worsening/changing swelling, please return to the maternity assessment unit
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
## 2023-08-26 NOTE — MAU Note (Signed)
..  Adriana Dawson is a 37 y.o. at [redacted]w[redacted]d here in MAU reporting: elevated blood pressure last night at home (160/103) then took it at work today and it was still high. Denies visual changes, headache, or epigastric pain.  Reports +FM. Denies vaginal bleeding, leaking of fluid, or contractions.  Pain score: 0/10 Vitals:   08/26/23 1929  BP: (!) 136/97  Pulse: 72  Resp: 18  Temp: 97.6 F (36.4 C)  SpO2: 100%     FHT:169 Lab orders placed from triage:  already in

## 2023-08-28 LAB — CULTURE, OB URINE: Culture: <10000 — AB

## 2023-08-30 ENCOUNTER — Other Ambulatory Visit: Payer: Self-pay

## 2023-08-30 ENCOUNTER — Inpatient Hospital Stay (HOSPITAL_COMMUNITY)
Admission: AD | Admit: 2023-08-30 | Discharge: 2023-09-02 | DRG: 788 | Disposition: A | Discharge status: Home / Self Care | Payer: Commercial Managed Care - PPO | Attending: Obstetrics and Gynecology | Admitting: Obstetrics and Gynecology

## 2023-08-30 ENCOUNTER — Inpatient Hospital Stay (HOSPITAL_COMMUNITY): Payer: Commercial Managed Care - PPO | Admitting: Anesthesiology

## 2023-08-30 ENCOUNTER — Ambulatory Visit: Payer: Commercial Managed Care - PPO

## 2023-08-30 ENCOUNTER — Encounter (HOSPITAL_COMMUNITY): Payer: Self-pay | Admitting: Obstetrics & Gynecology

## 2023-08-30 ENCOUNTER — Encounter (HOSPITAL_COMMUNITY)
Admission: AD | Disposition: A | Discharge status: Home / Self Care | Payer: Self-pay | Source: Home / Self Care | Attending: Obstetrics and Gynecology

## 2023-08-30 ENCOUNTER — Ambulatory Visit (HOSPITAL_BASED_OUTPATIENT_CLINIC_OR_DEPARTMENT_OTHER): Payer: Commercial Managed Care - PPO

## 2023-08-30 ENCOUNTER — Encounter: Payer: Commercial Managed Care - PPO | Admitting: Obstetrics and Gynecology

## 2023-08-30 DIAGNOSIS — Z7982 Long term (current) use of aspirin: Secondary | ICD-10-CM

## 2023-08-30 DIAGNOSIS — Z833 Family history of diabetes mellitus: Secondary | ICD-10-CM

## 2023-08-30 DIAGNOSIS — Z141 Cystic fibrosis carrier: Secondary | ICD-10-CM

## 2023-08-30 DIAGNOSIS — K219 Gastro-esophageal reflux disease without esophagitis: Secondary | ICD-10-CM | POA: Diagnosis present

## 2023-08-30 DIAGNOSIS — O43893 Other placental disorders, third trimester: Secondary | ICD-10-CM | POA: Diagnosis not present

## 2023-08-30 DIAGNOSIS — O99344 Other mental disorders complicating childbirth: Secondary | ICD-10-CM | POA: Diagnosis not present

## 2023-08-30 DIAGNOSIS — Z79899 Other long term (current) drug therapy: Secondary | ICD-10-CM | POA: Diagnosis not present

## 2023-08-30 DIAGNOSIS — O9962 Diseases of the digestive system complicating childbirth: Secondary | ICD-10-CM | POA: Diagnosis present

## 2023-08-30 DIAGNOSIS — Z3A34 34 weeks gestation of pregnancy: Secondary | ICD-10-CM

## 2023-08-30 DIAGNOSIS — O1414 Severe pre-eclampsia complicating childbirth: Secondary | ICD-10-CM | POA: Diagnosis not present

## 2023-08-30 DIAGNOSIS — O365930 Maternal care for other known or suspected poor fetal growth, third trimester, not applicable or unspecified: Secondary | ICD-10-CM | POA: Diagnosis not present

## 2023-08-30 DIAGNOSIS — O1415 Severe pre-eclampsia, complicating the puerperium: Secondary | ICD-10-CM | POA: Diagnosis not present

## 2023-08-30 DIAGNOSIS — O365990 Maternal care for other known or suspected poor fetal growth, unspecified trimester, not applicable or unspecified: Secondary | ICD-10-CM | POA: Insufficient documentation

## 2023-08-30 DIAGNOSIS — Z823 Family history of stroke: Secondary | ICD-10-CM

## 2023-08-30 DIAGNOSIS — Z8249 Family history of ischemic heart disease and other diseases of the circulatory system: Secondary | ICD-10-CM | POA: Diagnosis not present

## 2023-08-30 DIAGNOSIS — Z83438 Family history of other disorder of lipoprotein metabolism and other lipidemia: Secondary | ICD-10-CM

## 2023-08-30 DIAGNOSIS — Z887 Allergy status to serum and vaccine status: Secondary | ICD-10-CM | POA: Diagnosis not present

## 2023-08-30 DIAGNOSIS — O09523 Supervision of elderly multigravida, third trimester: Secondary | ICD-10-CM | POA: Diagnosis not present

## 2023-08-30 DIAGNOSIS — O285 Abnormal chromosomal and genetic finding on antenatal screening of mother: Secondary | ICD-10-CM | POA: Diagnosis not present

## 2023-08-30 LAB — COMPREHENSIVE METABOLIC PANEL
ALT: 20 U/L (ref 0–44)
AST: 24 U/L (ref 15–41)
Albumin: 2.8 g/dL — ABNORMAL LOW (ref 3.5–5.0)
Alkaline Phosphatase: 86 U/L (ref 38–126)
Anion gap: 10 (ref 5–15)
BUN: 10 mg/dL (ref 6–20)
CO2: 18 mmol/L — ABNORMAL LOW (ref 22–32)
Calcium: 9 mg/dL (ref 8.9–10.3)
Chloride: 106 mmol/L (ref 98–111)
Creatinine, Ser: 0.76 mg/dL (ref 0.44–1.00)
GFR, Estimated: >60 mL/min (ref >60)
Glucose, Bld: 77 mg/dL (ref 70–99)
Potassium: 4.5 mmol/L (ref 3.5–5.1)
Sodium: 134 mmol/L — ABNORMAL LOW (ref 135–145)
Total Bilirubin: 0.5 mg/dL (ref <1.2)
Total Protein: 6.2 g/dL — ABNORMAL LOW (ref 6.5–8.1)

## 2023-08-30 LAB — CBC
HCT: 39.6 % (ref 36.0–46.0)
Hemoglobin: 13.9 g/dL (ref 12.0–15.0)
MCH: 30.5 pg (ref 26.0–34.0)
MCHC: 35.1 g/dL (ref 30.0–36.0)
MCV: 86.8 fL (ref 80.0–100.0)
Platelets: 180 10*3/uL (ref 150–400)
RBC: 4.56 MIL/uL (ref 3.87–5.11)
RDW: 12.8 % (ref 11.5–15.5)
WBC: 6.9 10*3/uL (ref 4.0–10.5)
nRBC: 0 % (ref 0.0–0.2)

## 2023-08-30 LAB — TYPE AND SCREEN
ABO/RH(D): B POS
Antibody Screen: NEGATIVE

## 2023-08-30 LAB — RPR: RPR Ser Ql: NONREACTIVE

## 2023-08-30 SURGERY — Surgical Case
Anesthesia: General

## 2023-08-30 MED ORDER — NALOXONE HCL 4 MG/10ML IJ SOLN: 1.0000 ug/kg/h | INTRAVENOUS | Status: DC | PRN

## 2023-08-30 MED ORDER — PROPOFOL 10 MG/ML IV BOLUS
INTRAVENOUS | Status: AC
  Filled 2023-08-30: qty 20

## 2023-08-30 MED ORDER — SCOPOLAMINE 1 MG/3DAYS TD PT72
1.0000 | MEDICATED_PATCH | Freq: Once | TRANSDERMAL | Status: AC
  Administered 2023-08-30: 1.5 mg via TRANSDERMAL

## 2023-08-30 MED ORDER — SODIUM CHLORIDE 0.9% FLUSH: 3.0000 mL | INTRAVENOUS | Status: DC | PRN

## 2023-08-30 MED ORDER — ONDANSETRON HCL 4 MG/2ML IJ SOLN
INTRAMUSCULAR | Status: AC
Start: 2023-08-30 — End: ?
  Filled 2023-08-30: qty 2

## 2023-08-30 MED ORDER — KETOROLAC TROMETHAMINE 30 MG/ML IJ SOLN: 30.0000 mg | Freq: Four times a day (QID) | INTRAMUSCULAR | Status: AC | PRN

## 2023-08-30 MED ORDER — SUCCINYLCHOLINE CHLORIDE 200 MG/10ML IV SOSY
PREFILLED_SYRINGE | INTRAVENOUS | Status: DC | PRN
  Administered 2023-08-30: 120 mg via INTRAVENOUS

## 2023-08-30 MED ORDER — PENICILLIN G POT IN DEXTROSE 60000 UNIT/ML IV SOLN: 3.0000 10*6.[IU] | INTRAVENOUS | Status: DC

## 2023-08-30 MED ORDER — ONDANSETRON HCL 4 MG/2ML IJ SOLN: 4.0000 mg | Freq: Four times a day (QID) | INTRAMUSCULAR | Status: DC | PRN

## 2023-08-30 MED ORDER — FENTANYL CITRATE (PF) 100 MCG/2ML IJ SOLN
INTRAMUSCULAR | Status: AC
  Filled 2023-08-30: qty 2

## 2023-08-30 MED ORDER — MEPERIDINE HCL 25 MG/ML IJ SOLN: 6.2500 mg | INTRAMUSCULAR | Status: DC | PRN

## 2023-08-30 MED ORDER — DEXMEDETOMIDINE HCL IN NACL 80 MCG/20ML IV SOLN
INTRAVENOUS | Status: DC | PRN
  Administered 2023-08-30: 8 ug via INTRAVENOUS
  Administered 2023-08-30: 4 ug via INTRAVENOUS

## 2023-08-30 MED ORDER — OXYTOCIN-SODIUM CHLORIDE 30-0.9 UT/500ML-% IV SOLN
INTRAVENOUS | Status: AC
  Filled 2023-08-30: qty 500

## 2023-08-30 MED ORDER — SODIUM CHLORIDE 0.9 % IV SOLN
5.0000 10*6.[IU] | Freq: Once | INTRAVENOUS | Status: DC
  Filled 2023-08-30: qty 5

## 2023-08-30 MED ORDER — LIDOCAINE HCL (PF) 1 % IJ SOLN: 30.0000 mL | INTRAMUSCULAR | Status: DC | PRN

## 2023-08-30 MED ORDER — SCOPOLAMINE 1 MG/3DAYS TD PT72
MEDICATED_PATCH | TRANSDERMAL | Status: AC
Start: 2023-08-30 — End: ?
  Filled 2023-08-30: qty 1

## 2023-08-30 MED ORDER — OXYTOCIN BOLUS FROM INFUSION: 333.0000 mL | Freq: Once | INTRAVENOUS | Status: DC

## 2023-08-30 MED ORDER — BUSPIRONE HCL 10 MG PO TABS
20.0000 mg | ORAL_TABLET | Freq: Every day | ORAL | Status: DC
  Administered 2023-08-30 – 2023-09-01 (×3): 20 mg via ORAL
  Filled 2023-08-30 (×4): qty 2

## 2023-08-30 MED ORDER — METOCLOPRAMIDE HCL 5 MG/ML IJ SOLN
INTRAMUSCULAR | Status: DC | PRN
  Administered 2023-08-30: 5 mg via INTRAVENOUS

## 2023-08-30 MED ORDER — OXYCODONE HCL 5 MG PO TABS
5.0000 mg | ORAL_TABLET | ORAL | Status: DC | PRN
  Administered 2023-08-30: 5 mg via ORAL
  Administered 2023-08-30: 10 mg via ORAL
  Administered 2023-08-31: 5 mg via ORAL
  Administered 2023-08-31 – 2023-09-02 (×6): 10 mg via ORAL
  Filled 2023-08-30: qty 2
  Filled 2023-08-30 (×2): qty 1
  Filled 2023-08-30 (×5): qty 2
  Filled 2023-08-30: qty 1
  Filled 2023-08-30: qty 2

## 2023-08-30 MED ORDER — ACETAMINOPHEN 325 MG PO TABS: 650.0000 mg | ORAL_TABLET | ORAL | Status: DC | PRN

## 2023-08-30 MED ORDER — HYDROMORPHONE HCL 1 MG/ML IJ SOLN
INTRAMUSCULAR | Status: AC
  Filled 2023-08-30: qty 1

## 2023-08-30 MED ORDER — ACETAMINOPHEN 10 MG/ML IV SOLN
INTRAVENOUS | Status: AC
  Filled 2023-08-30: qty 100

## 2023-08-30 MED ORDER — SCOPOLAMINE 1 MG/3DAYS TD PT72
MEDICATED_PATCH | TRANSDERMAL | Status: AC
  Filled 2023-08-30: qty 1

## 2023-08-30 MED ORDER — HYDROMORPHONE HCL 1 MG/ML IJ SOLN
INTRAMUSCULAR | Status: DC | PRN
  Administered 2023-08-30 (×2): 1 mg via INTRAVENOUS

## 2023-08-30 MED ORDER — NIFEDIPINE ER OSMOTIC RELEASE 30 MG PO TB24
30.0000 mg | ORAL_TABLET | Freq: Every day | ORAL | Status: DC
  Administered 2023-08-30 – 2023-09-02 (×4): 30 mg via ORAL
  Filled 2023-08-30 (×4): qty 1

## 2023-08-30 MED ORDER — ACETAMINOPHEN 10 MG/ML IV SOLN: 1000.0000 mg | Freq: Once | INTRAVENOUS | Status: DC | PRN

## 2023-08-30 MED ORDER — DEXMEDETOMIDINE HCL IN NACL 80 MCG/20ML IV SOLN
INTRAVENOUS | Status: AC
  Filled 2023-08-30: qty 20

## 2023-08-30 MED ORDER — LACTATED RINGERS IV BOLUS
500.0000 mL | Freq: Once | INTRAVENOUS | Status: AC
  Administered 2023-08-30: 500 mL via INTRAVENOUS

## 2023-08-30 MED ORDER — LACTATED RINGERS IV SOLN: INTRAVENOUS | Status: DC

## 2023-08-30 MED ORDER — KETOROLAC TROMETHAMINE 30 MG/ML IJ SOLN
INTRAMUSCULAR | Status: AC
  Filled 2023-08-30: qty 1

## 2023-08-30 MED ORDER — FENTANYL CITRATE (PF) 250 MCG/5ML IJ SOLN
INTRAMUSCULAR | Status: DC | PRN
  Administered 2023-08-30: 50 ug via INTRAVENOUS
  Administered 2023-08-30: 200 ug via INTRAVENOUS

## 2023-08-30 MED ORDER — DEXAMETHASONE SODIUM PHOSPHATE 10 MG/ML IJ SOLN
INTRAMUSCULAR | Status: AC
  Filled 2023-08-30: qty 1

## 2023-08-30 MED ORDER — DIPHENHYDRAMINE HCL 25 MG PO CAPS: 25.0000 mg | ORAL_CAPSULE | ORAL | Status: DC | PRN

## 2023-08-30 MED ORDER — ESCITALOPRAM OXALATE 10 MG PO TABS
30.0000 mg | ORAL_TABLET | Freq: Every day | ORAL | Status: DC
  Administered 2023-08-30 – 2023-09-01 (×3): 30 mg via ORAL
  Filled 2023-08-30 (×3): qty 3

## 2023-08-30 MED ORDER — CEFAZOLIN SODIUM-DEXTROSE 2-4 GM/100ML-% IV SOLN
INTRAVENOUS | Status: AC
  Filled 2023-08-30: qty 100

## 2023-08-30 MED ORDER — DEXAMETHASONE SODIUM PHOSPHATE 4 MG/ML IJ SOLN
INTRAMUSCULAR | Status: DC | PRN
  Administered 2023-08-30: 10 mg via INTRAVENOUS

## 2023-08-30 MED ORDER — BETAMETHASONE SOD PHOS & ACET 6 (3-3) MG/ML IJ SUSP
12.0000 mg | INTRAMUSCULAR | Status: DC
  Filled 2023-08-30: qty 5

## 2023-08-30 MED ORDER — FENTANYL CITRATE (PF) 100 MCG/2ML IJ SOLN
INTRAMUSCULAR | Status: DC | PRN
Start: 2023-08-30 — End: 2023-08-30
  Administered 2023-08-30: 50 ug via INTRAVENOUS

## 2023-08-30 MED ORDER — PROPOFOL 10 MG/ML IV BOLUS
INTRAVENOUS | Status: DC | PRN
  Administered 2023-08-30: 200 mg via INTRAVENOUS

## 2023-08-30 MED ORDER — FUROSEMIDE 20 MG PO TABS
20.0000 mg | ORAL_TABLET | Freq: Every day | ORAL | Status: DC
  Administered 2023-08-30 – 2023-09-02 (×4): 20 mg via ORAL
  Filled 2023-08-30 (×4): qty 1

## 2023-08-30 MED ORDER — DIPHENHYDRAMINE HCL 50 MG/ML IJ SOLN: 12.5000 mg | INTRAMUSCULAR | Status: DC | PRN

## 2023-08-30 MED ORDER — TRANEXAMIC ACID 1000 MG/10ML IV SOLN
INTRAVENOUS | Status: DC | PRN
  Administered 2023-08-30: 1000 mg via INTRAVENOUS

## 2023-08-30 MED ORDER — OXYTOCIN-SODIUM CHLORIDE 30-0.9 UT/500ML-% IV SOLN
2.5000 [IU]/h | INTRAVENOUS | Status: DC
  Filled 2023-08-30: qty 500

## 2023-08-30 MED ORDER — LIDOCAINE HCL (CARDIAC) PF 50 MG/5ML IV SOSY
PREFILLED_SYRINGE | INTRAVENOUS | Status: DC | PRN
  Administered 2023-08-30: 40 mg via INTRAVENOUS

## 2023-08-30 MED ORDER — LACTATED RINGERS IV SOLN: 500.0000 mL | INTRAVENOUS | Status: DC | PRN

## 2023-08-30 MED ORDER — FENTANYL CITRATE (PF) 100 MCG/2ML IJ SOLN
25.0000 ug | INTRAMUSCULAR | Status: DC | PRN
  Administered 2023-08-30: 25 ug via INTRAVENOUS

## 2023-08-30 MED ORDER — FENTANYL CITRATE (PF) 250 MCG/5ML IJ SOLN
INTRAMUSCULAR | Status: AC
  Filled 2023-08-30: qty 5

## 2023-08-30 MED ORDER — ACETAMINOPHEN 325 MG PO TABS: 325.0000 mg | ORAL_TABLET | ORAL | Status: DC | PRN

## 2023-08-30 MED ORDER — SUCCINYLCHOLINE CHLORIDE 200 MG/10ML IV SOSY
PREFILLED_SYRINGE | INTRAVENOUS | Status: AC
Start: 2023-08-30 — End: ?
  Filled 2023-08-30: qty 10

## 2023-08-30 MED ORDER — ONDANSETRON HCL 4 MG/2ML IJ SOLN
INTRAMUSCULAR | Status: DC | PRN
  Administered 2023-08-30: 4 mg via INTRAMUSCULAR

## 2023-08-30 MED ORDER — METOCLOPRAMIDE HCL 5 MG/ML IJ SOLN
INTRAMUSCULAR | Status: AC
  Filled 2023-08-30: qty 2

## 2023-08-30 MED ORDER — ACETAMINOPHEN 160 MG/5ML PO SOLN: 325.0000 mg | ORAL | Status: DC | PRN

## 2023-08-30 MED ORDER — OXYTOCIN-SODIUM CHLORIDE 30-0.9 UT/500ML-% IV SOLN
INTRAVENOUS | Status: DC | PRN
  Administered 2023-08-30: 300 mL via INTRAVENOUS

## 2023-08-30 MED ORDER — LACTATED RINGERS IV SOLN: INTRAVENOUS | Status: DC | PRN

## 2023-08-30 MED ORDER — KETOROLAC TROMETHAMINE 30 MG/ML IJ SOLN
30.0000 mg | Freq: Four times a day (QID) | INTRAMUSCULAR | Status: AC | PRN
  Administered 2023-08-30 – 2023-08-31 (×3): 30 mg via INTRAVENOUS
  Filled 2023-08-30 (×3): qty 1

## 2023-08-30 MED ORDER — NALOXONE HCL 0.4 MG/ML IJ SOLN: 0.4000 mg | INTRAMUSCULAR | Status: DC | PRN

## 2023-08-30 MED ORDER — SOD CITRATE-CITRIC ACID 500-334 MG/5ML PO SOLN: 30.0000 mL | ORAL | Status: DC | PRN

## 2023-08-30 MED ORDER — ONDANSETRON HCL 4 MG/2ML IJ SOLN: 4.0000 mg | Freq: Three times a day (TID) | INTRAMUSCULAR | Status: DC | PRN

## 2023-08-30 MED ORDER — ACETAMINOPHEN 10 MG/ML IV SOLN
INTRAVENOUS | Status: DC | PRN
  Administered 2023-08-30: 1000 mg via INTRAVENOUS

## 2023-08-30 MED ORDER — CEFAZOLIN SODIUM-DEXTROSE 2-3 GM-%(50ML) IV SOLR
INTRAVENOUS | Status: DC | PRN
  Administered 2023-08-30: 2 g via INTRAVENOUS

## 2023-08-30 SURGICAL SUPPLY — 33 items
BARRIER ADHS 3X4 INTERCEED (GAUZE/BANDAGES/DRESSINGS) IMPLANT
BENZOIN TINCTURE PRP APPL 2/3 (GAUZE/BANDAGES/DRESSINGS) IMPLANT
CHLORAPREP W/TINT 26 (MISCELLANEOUS) ×2 IMPLANT
CLAMP UMBILICAL CORD (MISCELLANEOUS) ×1 IMPLANT
CLOTH BEACON ORANGE TIMEOUT ST (SAFETY) ×1 IMPLANT
DRSG OPSITE POSTOP 4X10 (GAUZE/BANDAGES/DRESSINGS) ×1 IMPLANT
ELECT REM PT RETURN 9FT ADLT (ELECTROSURGICAL) ×1
ELECTRODE REM PT RTRN 9FT ADLT (ELECTROSURGICAL) ×1 IMPLANT
EXTRACTOR VACUUM KIWI (MISCELLANEOUS) IMPLANT
GAUZE SPONGE 4X4 12PLY STRL LF (GAUZE/BANDAGES/DRESSINGS) IMPLANT
GLOVE BIO SURGEON STRL SZ 6.5 (GLOVE) ×1 IMPLANT
GLOVE BIOGEL PI IND STRL 7.0 (GLOVE) ×2 IMPLANT
GOWN STRL REUS W/TWL LRG LVL3 (GOWN DISPOSABLE) ×2 IMPLANT
KIT ABG SYR 3ML LUER SLIP (SYRINGE) IMPLANT
NDL HYPO 25X5/8 SAFETYGLIDE (NEEDLE) IMPLANT
NEEDLE HYPO 22GX1.5 SAFETY (NEEDLE) IMPLANT
NEEDLE HYPO 25X5/8 SAFETYGLIDE (NEEDLE) IMPLANT
NS IRRIG 1000ML POUR BTL (IV SOLUTION) ×1 IMPLANT
PACK C SECTION WH (CUSTOM PROCEDURE TRAY) ×1 IMPLANT
PAD ABD DERMACEA PRESS 5X9 (GAUZE/BANDAGES/DRESSINGS) IMPLANT
PAD OB MATERNITY 4.3X12.25 (PERSONAL CARE ITEMS) ×1 IMPLANT
RETRACTOR WND ALEXIS 25 LRG (MISCELLANEOUS) IMPLANT
RTRCTR WOUND ALEXIS 25CM LRG (MISCELLANEOUS)
STRIP CLOSURE SKIN 1/2X4 (GAUZE/BANDAGES/DRESSINGS) IMPLANT
SUT PLAIN ABS 2-0 CT1 27XMFL (SUTURE) IMPLANT
SUT VIC AB 0 CT1 36 (SUTURE) ×6 IMPLANT
SUT VIC AB 2-0 CT1 TAPERPNT 27 (SUTURE) ×1 IMPLANT
SUT VIC AB 4-0 KS 27 (SUTURE) IMPLANT
SUT VIC AB 4-0 PS2 27 (SUTURE) ×1 IMPLANT
SYR CONTROL 10ML LL (SYRINGE) IMPLANT
TOWEL OR 17X24 6PK STRL BLUE (TOWEL DISPOSABLE) ×1 IMPLANT
TRAY FOLEY W/BAG SLVR 14FR LF (SET/KITS/TRAYS/PACK) IMPLANT
WATER STERILE IRR 1000ML POUR (IV SOLUTION) ×1 IMPLANT

## 2023-08-30 NOTE — Transfer of Care (Signed)
Immediate Anesthesia Transfer of Care Note  Patient: Adriana Dawson  Procedure(s) Performed: CESAREAN SECTION  Patient Location: PACU  Anesthesia Type:General  Level of Consciousness: awake, alert , and oriented  Airway & Oxygen Therapy: Patient Spontanous Breathing  Post-op Assessment: Report given to RN and Post -op Vital signs reviewed and stable  Post vital signs: Reviewed and stable  Last Vitals:  Vitals Value Taken Time  BP 150/91 08/30/23 1406  Temp    Pulse 73 08/30/23 1411  Resp 13 08/30/23 1411  SpO2 97 % 08/30/23 1411  Vitals shown include unfiled device data. The patient states that she is comfortable and not in pain. Last Pain:  Vitals:   08/30/23 1202  TempSrc:   PainSc: 0-No pain         Complications: No notable events documented.

## 2023-08-30 NOTE — Procedures (Signed)
Adriana Dawson [birth year] [redacted]w[redacted]d  Fetus A Non-Stress Test Interpretation for 08/30/23  Indication: IUGR  Fetal Heart Rate A Mode: External Baseline Rate (A): 130 bpm Variability: Moderate Accelerations: 15 x 15 Decelerations: None Multiple birth?: No  Uterine Activity Mode: Palpation, Toco Contraction Frequency (min): None Resting Tone Palpated: Relaxed Resting Time: Adequate  Interpretation (Fetal Testing) Nonstress Test Interpretation: Reactive Comments: Dr. Judeth Cornfield reviewed tracing.

## 2023-08-30 NOTE — Plan of Care (Signed)

## 2023-08-30 NOTE — Lactation Note (Signed)
This note was copied from a baby's chart.  NICU Lactation Consultation Note  Patient Name: Adriana Dawson [id number] Date: 08/30/2023 Age:37 hours  Reason for consult: Initial assessment; NICU baby; Primapara; 1st time breastfeeding; Late-preterm 34-36.6wks; Infant < 6lbs; Other (Comment) (GHTN, FGR of infant)  SUBJECTIVE  LC in to visit with P1 Mom of 34 wk baby Adriana delivered by C/Section and admitted to the NICU.  Baby needed respiratory support initially, but is currently on room air.  Mom wanting to initiate pumping to stimulate and support a full milk supply.  Her feeding goal is to eventually breastfeed baby.    LC reviewed breast massage and hand expression, glistening noted on nipple.   LC initiated double pumping using 18 mm flanges for a good fit.  Pillow support under elbows.  Mom aware of colostrum protector use for 24 hrs only.  LC will F/U tomorrow and discard them.    LC set up washing and drying bins for pump parts and provided [name] with the CDC guidelines on how to properly clean the pump parts.  Plan written on dry erase board- 1- STS with baby when able to 2-Massage breasts and hand express for drops of colostrum 3- Pump both breasts on initiation setting for 15 mins every 2-3 hrs when awake 4-You've got this!  Mom does have a pump at home (gift), unsure of brand.  She has not submitted for an employee insurance pump.  LC submitted a [employer] pump request, asking for a Spectra, not the Max Flow.  OBJECTIVE Infant data: No data recorded O2 Device: Room Air  Infant feeding assessment No data recorded  Maternal data: G2P0010 C-Section, Low Transverse Has patient been taught Hand Expression?: Yes Hand Expression Comments: glistening on nipple Significant Breast History:: ++ breast changes in pregnancy Current breast feeding challenges:: Infant separation Does the patient have breastfeeding experience prior to this delivery?: No Pumping frequency: Initiated pumping  at 3 hrs after birth Pumped volume: 0 mL Flange Size: 18 Risk factor for low/delayed milk supply:: LPTI admitted to NICU  WIC Program: No WIC Referral Sent?: No Pump:  (Mom is an employee, submitted a [employer] pump request for a Spectra DEBP)  No data recorded Maternal: Milk volume: Normal  INTERVENTIONS/PLAN Interventions: Interventions: Breast feeding basics reviewed; Skin to skin; Breast massage; Hand express; DEBP; Support pillows; Education; [employer] brochure; CDC Guidelines for Breast Pump Cleaning Tools: Pump; Flanges Pump Education: Setup, frequency, and cleaning; Milk Storage  Plan: Consult Status: NICU follow-up NICU Follow-up type: New admission follow up   Judee Clara 08/30/2023, 5:00 PM

## 2023-08-30 NOTE — Progress Notes (Signed)
Foley catheter placed for cervical ripening. Immediately after Foley placement fetal heart rate found to be in the 90s. This did not resolve with position change. Difficulty finding fetal heart rate so ultrasound obtained, hr found to now be in the 50s without recovery. At the 8 minute mark with Dr. Debroah Loop present we called an emergency cesarean and expedited informed consent obtained. The time was 12:52 PM. In OR at 12:54, induction of general anesthesia at 12:59, case started at 13:00, and the infant delivered at 13:02. See op note for details of procedure.

## 2023-08-30 NOTE — Anesthesia Procedure Notes (Signed)
Procedure Name: Intubation Date/Time: 08/30/2023 12:59 PM  Performed by: Shelton Silvas, MDPre-anesthesia Checklist: Patient identified, Patient being monitored, Timeout performed, Emergency Drugs available and Suction available Patient Re-evaluated:Patient Re-evaluated prior to induction Oxygen Delivery Method: Circle System Utilized Preoxygenation: Pre-oxygenation with 100% oxygen Induction Type: IV induction, Cricoid Pressure applied and Rapid sequence Laryngoscope Size: Mac, 3 and Glidescope Grade View: Grade I Tube type: Oral Tube size: 7.0 mm Number of attempts: 1 Airway Equipment and Method: stylet, Video-laryngoscopy and Stylet Placement Confirmation: ETT inserted through vocal cords under direct vision, positive ETCO2 and breath sounds checked- equal and bilateral Secured at: 21 cm Tube secured with: Tape Dental Injury: Teeth and Oropharynx as per pre-operative assessment
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
## 2023-08-30 NOTE — Anesthesia Preprocedure Evaluation (Signed)
Anesthesia Evaluation  Patient identified by MRN, date of birth, ID band Patient awake    Reviewed: Unable to perform ROS - Chart review onlyPreop documentation limited or incomplete due to emergent nature of procedure.  Airway Mallampati: I       Dental no notable dental hx.    Pulmonary    Pulmonary exam normal        Cardiovascular hypertension, Normal cardiovascular exam     Neuro/Psych  PSYCHIATRIC DISORDERS Anxiety Depression       GI/Hepatic ,GERD  Medicated,,  Endo/Other    Renal/GU      Musculoskeletal   Abdominal   Peds  Hematology   Anesthesia Other Findings   Reproductive/Obstetrics (+) Pregnancy                             Anesthesia Physical Anesthesia Plan  ASA: 2 and emergent  Anesthesia Plan: General   Post-op Pain Management:    Induction: Intravenous, Rapid sequence and Cricoid pressure planned  PONV Risk Score and Plan: 4 or greater and Ondansetron, Dexamethasone, Midazolam and Treatment may vary due to age or medical condition  Airway Management Planned: Oral ETT and Video Laryngoscope Planned  Additional Equipment: None  Intra-op Plan:   Post-operative Plan: Extubation in OR  Informed Consent: I have reviewed the patients History and Physical, chart, labs and discussed the procedure including the risks, benefits and alternatives for the proposed anesthesia with the patient or authorized representative who has indicated his/her understanding and acceptance.     Dental advisory given and Only emergency history available  Plan Discussed with: CRNA  Anesthesia Plan Comments:        Anesthesia Quick Evaluation

## 2023-08-30 NOTE — Discharge Summary (Signed)
Postpartum Discharge Summary  Date of Service updated-12/6     Patient Name: Adriana Dawson DOB: 05-08-1986 MRN: [id number]  Date of admission: 08/30/2023 Delivery date:08/30/2023 Delivering provider: Shonna Chock [place] Date of discharge: 09/02/2023  Admitting diagnosis: IUGR (intrauterine growth restriction) affecting care of mother [O36.5990] Intrauterine pregnancy: [redacted]w[redacted]d     Secondary diagnosis:  Principal Problem:   IUGR (intrauterine growth restriction) affecting care of mother  Additional problems: Preeclampsia with severe features    Discharge diagnosis: Preterm Pregnancy Delivered and Preeclampsia (severe)                                              Post partum procedures: none Augmentation: IP Foley Complications: None  Hospital course: Induction of Labor With Cesarean Section   37 y.o. yo G2P0111 at [redacted]w[redacted]d was admitted to the hospital 08/30/2023 for induction of labor. Patient had a labor course significant for terminal decel right after placement of Foley catheter. The patient went for emergency cesarean section due to Non-Reassuring FHR. Delivery details are as follows: Membrane Rupture Time/Date:  ,   Delivery Method:C-Section, Low Transverse Operative Delivery:N/A Details of operation can be found in separate operative Note.  Patient had a postpartum course complicated by preeclampsia with severe features- she was treated with IV Magnesium and BP was treated with lasix and procardia. She is ambulating, tolerating a regular diet, passing flatus, and urinating well.  Patient is discharged home in stable condition on 09/02/23.      Newborn Data: Birth date:08/30/2023 Birth time:1:02 PM Gender:Female Living status:Living Apgars:6 ,8  Weight:1430 g                               Magnesium Sulfate received: Yes: Seizure prophylaxis BMZ received: No Rhophylac:No MMR:No T-DaP:Given prenatally Flu: No RSV Vaccine received: No Transfusion:No  Immunizations  received: Immunization History  Administered Date(s) Administered   Influenza,inj,Quad PF,6+ Mos 06/07/2017   Influenza-Unspecified 06/29/2019, 06/28/2023   PFIZER(Purple Top)SARS-COV-2 Vaccination 09/25/2019, 10/16/2019, 07/25/2020   Tdap 07/20/2023    Physical exam  Vitals:   09/01/23 1122 09/01/23 1519 09/01/23 1950 09/02/23 0600  BP: 112/88 111/85 111/77 109/80  Pulse: (!) 110 99 94 93  Resp: 18 18 18 16   Temp: 98.2 F (36.8 C) 97.8 F (36.6 C) 98.1 F (36.7 C)   TempSrc: Oral Oral Oral   SpO2:  97% 99% 100%  Weight:      Height:       General: alert, cooperative, and no distress Lochia: appropriate Uterine Fundus: firm Incision: Dressing is clean, dry, and intact DVT Evaluation: No evidence of DVT seen on physical exam. Labs: Lab Results  Component Value Date   WBC 16.9 (H) 08/31/2023   HGB 11.9 (L) 08/31/2023   HCT 34.0 (L) 08/31/2023   MCV 86.3 08/31/2023   PLT 160 08/31/2023      Latest Ref Rng & Units 08/30/2023   12:11 PM  CMP  Glucose 70 - 99 mg/dL 77   BUN 6 - 20 mg/dL 10   Creatinine 4.09 - 1.00 mg/dL 8.11   Sodium 914 - 782 mmol/L 134   Potassium 3.5 - 5.1 mmol/L 4.5   Chloride 98 - 111 mmol/L 106   CO2 22 - 32 mmol/L 18   Calcium 8.9 - 10.3 mg/dL 9.0   Total  Protein 6.5 - 8.1 g/dL 6.2   Total Bilirubin <1.6 mg/dL 0.5   Alkaline Phos 38 - 126 U/L 86   AST 15 - 41 U/L 24   ALT 0 - 44 U/L 20    Edinburgh Score:    08/30/2023    6:00 PM  Edinburgh Postnatal Depression Scale Screening Tool  I have been able to laugh and see the funny side of things. 0  I have looked forward with enjoyment to things. 0  I have blamed myself unnecessarily when things went wrong. 1  I have been anxious or worried for no good reason. 1  I have felt scared or panicky for no good reason. 1  Things have been getting on top of me. 1  I have been so unhappy that I have had difficulty sleeping. 2  I have felt sad or miserable. 1  I have been so unhappy that I have  been crying. 1  The thought of harming myself has occurred to me. 0  Edinburgh Postnatal Depression Scale Total 8   No data recorded  After visit meds:  Allergies as of 09/02/2023       Reactions   Oseltamivir    6 years ago patient was given a medication for the flu(possibly tamiflu) and it made her face fell funny.   Other Other (See Comments)   6 years ago patient was given a medication for the flu(possibly tamiflu) and it made her face fell funny.        Medication List     STOP taking these medications    aspirin EC 81 MG tablet   famotidine 20 MG tablet Commonly known as: Pepcid   ondansetron 4 MG disintegrating tablet Commonly known as: ZOFRAN-ODT       TAKE these medications    acetaminophen 325 MG tablet Commonly known as: Tylenol Take 2 tablets (650 mg total) by mouth every 6 (six) hours as needed.   busPIRone 10 MG tablet Commonly known as: BUSPAR Take 2 tablets (20 mg total) by mouth 3 (three) times daily.   docusate sodium 100 MG capsule Commonly known as: Colace Take 1 capsule (100 mg total) by mouth 2 (two) times daily.   escitalopram 10 MG tablet Commonly known as: LEXAPRO Take 3 tablets (30 mg total) by mouth at bedtime. What changed:  medication strength how much to take when to take this   furosemide 20 MG tablet Commonly known as: LASIX Take 1 tablet (20 mg total) by mouth daily for 4 days. Start taking on: September 03, 2023   gabapentin 300 MG capsule Commonly known as: Neurontin Take 1 capsule (300 mg total) by mouth 3 (three) times daily for 3 days.   ibuprofen 600 MG tablet Commonly known as: ADVIL Take 1 tablet (600 mg total) by mouth every 6 (six) hours.   oxyCODONE 5 MG immediate release tablet Commonly known as: Oxy IR/ROXICODONE Take 1 tablet (5 mg total) by mouth every 6 (six) hours as needed for up to 7 days for moderate pain (pain score 4-6) or breakthrough pain.   prenatal multivitamin Tabs tablet Take 1 tablet  by mouth daily at 12 noon.   vitamin C 100 MG tablet Take 100 mg by mouth daily.         Discharge home in stable condition Infant Feeding: Breast Infant Disposition:NICU Discharge instruction: per After Visit Summary and Postpartum booklet. Activity: Advance as tolerated. Pelvic rest for 6 weeks.  Diet: routine diet Future Appointments: Future  Appointments  Date Time Provider Department Center  09/13/2023  9:30 AM Rasch, Harolyn Rutherford, NP [facility]  09/22/2023  9:30 AM Lennart Pall, MD [facility]   Follow up Visit:  Follow-up Information     [facility]. Go to.   Specialty: Obstetrics and Gynecology Why: Please follow up in one week Contact information: [address] [place] [address] [phone]                 Please schedule this patient for a In person postpartum visit in 1 week with the following provider: Any provider. Additional Postpartum F/U:Incision check 1 week and BP check 1 week  High risk pregnancy complicated by:  preeclampsia with severe features, FGR Delivery mode:  C-Section, Low Transverse Anticipated Birth Control:  Condoms   09/02/2023 Sharon Seller, DO

## 2023-08-30 NOTE — Op Note (Signed)
Cesarean Section Operative Report  Adriana Dawson  08/30/2023  Indications: fetal indications (terminal bradycardia)  Pre-operative Diagnosis: Emergency primary low transverse cesarean section  Post-operative Diagnosis: Same   Surgeon: Surgeons and Role:    * Adam Phenix, MD - Primary    * Berdina Cheever, Wilfred Curtis, MD - Assisting   Attending Attestation: I was present and scrubbed for the entire procedure.   An experienced assistant was required given the standard of surgical care given the complexity of the case.  This assistant was needed for exposure, dissection, suctioning, retraction, instrument exchange, assisting with delivery with administration of fundal pressure, and for overall help during the procedure.  Anesthesia: general    Quantified Blood Loss: 351 ml  Total IV Fluids: 500 ml LR  Urine Output:: 150 ml clear yellow urine  Specimens: placenta to pathology  Findings: Viable female infant in cephalic presentation; Apgars pending; weight pending; arterial cord pH 7.24;  clear amniotic fluid; intact placenta with three vessel cord.  Baby condition / location:  NICU   Complications: no complications  Indications: Margia Schmidt is a 37 y.o. G2P0010 with an IUP [redacted]w[redacted]d presenting for IOL for severe IUGR with absent end-diastolic flow. Fetal bradycardia after placement of Foley for cervical ripening that did not resolve with position changes.  The risks, benefits, complications, treatment options, and exected outcomes were discussed with the patient . The patient dwith the proposed plan, giving informed consent. identified as Adriana Dawson and the procedure verified as C-Section Delivery.  Procedure Details:   The patient was taken back to the operative suite where iodine was splashed on the abdomen, a time out was held, and general anesthesia administered.  A Pfannenstiel incision was made and carried down through the subcutaneous tissue to the fascia. Fascial incision was made  and bluntly extended transversely. The fascia was separated from the underlying rectus tissue superiorly and inferiorly. The peritoneum was identified and bluntly entered and extended longitudinally. Alexis retractor was placed. A bladder flap was not created. A low transverse uterine incision was made and extended bluntly. Delivered from cephalic presentation was a viable infant with Apgars and weight as above.  After waiting 30 seconds for delayed cord cutting, the umbilical cord was clamped and cut cord blood was obtained for evaluation. Cord ph was sent. The placenta was removed Intact and appeared  small . The uterine incision was closed with running unlocked sutures 0-Vicryl in one layer.   Hemostasis was observed. The rectus muscles were examined and it was noted that we inadvertently entered the peritoneum by dividing the left rectus but fortunately things were hemostatic. The fascia was then reapproximated with running sutures of 0-Vicryl. The subcuticular closure was performed with 2-0 plain gut. The skin was closed with 4-0 Vicryl.  Instrument, sponge, and needle counts were correct prior the abdominal closure and were correct at the conclusion of the case.     Disposition: PACU - hemodynamically stable.   Maternal Condition: stable       Signed: Silvano Bilis, MD 08/30/2023 1:51 PM

## 2023-08-30 NOTE — Anesthesia Postprocedure Evaluation (Signed)
Anesthesia Post Note  Patient: Adriana Dawson  Procedure(s) Performed: CESAREAN SECTION     Patient location during evaluation: PACU Anesthesia Type: General Level of consciousness: awake and alert Pain management: pain level controlled Vital Signs Assessment: post-procedure vital signs reviewed and stable Respiratory status: spontaneous breathing, nonlabored ventilation, respiratory function stable and patient connected to nasal cannula oxygen Cardiovascular status: blood pressure returned to baseline and stable Postop Assessment: no apparent nausea or vomiting Anesthetic complications: no  No notable events documented.  Last Vitals:  Vitals:   08/30/23 1516 08/30/23 1534  BP: (!) 146/86 126/83  Pulse:  74  Resp:  17  Temp:  36.7 C  SpO2:  98%    Last Pain:  Vitals:   08/30/23 1534  TempSrc: Oral  PainSc:    Pain Goal:                Epidural/Spinal Function Cutaneous sensation: Normal sensation (08/30/23 1500)  Shelton Silvas

## 2023-08-31 ENCOUNTER — Encounter (HOSPITAL_COMMUNITY): Payer: Self-pay | Admitting: Obstetrics & Gynecology

## 2023-08-31 LAB — CBC
HCT: 34 % — ABNORMAL LOW (ref 36.0–46.0)
Hemoglobin: 11.9 g/dL — ABNORMAL LOW (ref 12.0–15.0)
MCH: 30.2 pg (ref 26.0–34.0)
MCHC: 35 g/dL (ref 30.0–36.0)
MCV: 86.3 fL (ref 80.0–100.0)
Platelets: 160 10*3/uL (ref 150–400)
RBC: 3.94 MIL/uL (ref 3.87–5.11)
RDW: 12.6 % (ref 11.5–15.5)
WBC: 16.9 10*3/uL — ABNORMAL HIGH (ref 4.0–10.5)
nRBC: 0 % (ref 0.0–0.2)

## 2023-08-31 MED ORDER — ENOXAPARIN SODIUM 40 MG/0.4ML IJ SOSY
40.0000 mg | PREFILLED_SYRINGE | INTRAMUSCULAR | Status: DC
  Administered 2023-08-31 – 2023-09-02 (×3): 40 mg via SUBCUTANEOUS
  Filled 2023-08-31 (×3): qty 0.4

## 2023-08-31 MED ORDER — ACETAMINOPHEN 500 MG PO TABS
1000.0000 mg | ORAL_TABLET | Freq: Four times a day (QID) | ORAL | Status: DC | PRN
  Administered 2023-08-31 – 2023-09-02 (×4): 1000 mg via ORAL
  Filled 2023-08-31 (×4): qty 2

## 2023-08-31 MED ORDER — IBUPROFEN 600 MG PO TABS
600.0000 mg | ORAL_TABLET | Freq: Four times a day (QID) | ORAL | Status: DC
  Administered 2023-08-31 – 2023-09-02 (×7): 600 mg via ORAL
  Filled 2023-08-31 (×9): qty 1

## 2023-08-31 NOTE — Lactation Note (Addendum)
This note was copied from a baby's chart.  NICU Lactation Consultation Note  Patient Name: Adriana Dawson [id number] Date: 08/31/2023 Age:37 hours  Reason for consult: Follow-up assessment; Primapara; 1st time breastfeeding; NICU baby; Late-preterm 34-36.6wks; Infant < 6lbs; Other (Comment) (GHTN)  SUBJECTIVE  LC in to visit with P1 Mom of 34 wk baby in the NICU.  Baby is currently on room air after being on HFNC overnight.  No feedings started yet.  Mom desires to direct breastfeed when baby is able to, but did consent to donor breast milk as a bridge to mother's own milk.  Mom has been pumping and seeing a drop, but unable to collect any yet.  LC provided Mom with a pumping top and assisted her to pump for the 3rd time today.    Mom got to hold baby yesterday, but not STS.  Encouraged STS with baby next time she goes to his room, sharing the benefits to baby and Mom with family.  STORK pump request made yesterday, called and LM today asking about this as no pump was delivered yet.  LC holding off on the [employer] Employee pump issuance at present.  Mom understands she is eligible to one of the [employer] pumps.   Encouraged Mom to call for lactation assistance if baby is cueing and wants to feed.  OBJECTIVE Infant data: No data recorded O2 Device: (S) Room Air (patient had pulled off HFNC) O2 Flow Rate (L/min): 4 L/min FiO2 (%): 21 %  Infant feeding assessment Scale for Readiness: 2   Maternal data: G2P0111 C-Section, Low Transverse Has patient been taught Hand Expression?: Yes Hand Expression Comments: glistening on nipple Significant Breast History:: ++ breast changes in pregnancy Current breast feeding challenges:: Infant separation Does the patient have breastfeeding experience prior to this delivery?: No Pumping frequency: 4 times last 24 hrs, encouraged to pump every 3 hrs today Pumped volume: 0 mL (drop) Flange Size: 18 Hands-free pumping top sizes: Small/Medium  (Blue) Risk factor for low/delayed milk supply:: LPTI admitted to NICU  WIC Program: No WIC Referral Sent?: No Pump:  (Called and LM with [employer] regarding coverage for Spectra)  Feeding Status: NPO  Maternal: Milk volume: Normal  INTERVENTIONS/PLAN Interventions: Interventions: Breast feeding basics reviewed; Skin to skin; Breast massage; Hand express; DEBP; Education Tools: Pump; Flanges; Hands-free pumping top Pump Education: Setup, frequency, and cleaning; Milk Storage  Plan: Consult Status: NICU follow-up NICU Follow-up type: Verify absence of engorgement; Verify onset of copious milk; Verify DEBP issuance   Judee Clara 08/31/2023, 11:26 AM

## 2023-08-31 NOTE — Progress Notes (Addendum)
Subjective: Postpartum Day 1: Cesarean Delivery Patient reports feeling well. She was found sitting on the side of the bed eating breakfast with her partner. She reports ambulating to the bathroom. She denies feeling dizzy or lightheaded. She is voiding   Objective: Vital signs in last 24 hours: Temp:  [97.5 F (36.4 C)-98.3 F (36.8 C)] 98 F (36.7 C) (12/04 0400) Pulse Rate:  [54-79] 72 (12/04 0400) Resp:  [13-18] 16 (12/04 0400) BP: (126-167)/(80-95) 131/90 (12/04 0400) SpO2:  [96 %-100 %] 98 % (12/03 2313) Weight:  [84.1 kg] 84.1 kg (12/03 1200)  Physical Exam:  General: alert, cooperative, and no distress Lochia: appropriate Uterine Fundus: firm Incision: honey comb dressing stained centrally with old blood DVT Evaluation: No evidence of DVT seen on physical exam.  Recent Labs    08/30/23 1211  HGB 13.9  HCT 39.6    Assessment/Plan: Status post Cesarean section. Doing well postoperatively. Patient with preeclampsia without severe feature. BP stable on procardia Continue current care Follow up am CBC Encourage ambulation.  Catalina Antigua, MD 08/31/2023, 8:02 AM

## 2023-08-31 NOTE — Progress Notes (Signed)
Attending Circumcision Counseling Progress Note  Patient desires circumcision for her female infant.  Circumcision procedure details discussed, risks and benefits of procedure were also discussed.  These include but are not limited to: Benefits of circumcision in men include reduction in the rates of urinary tract infection (UTI), penile cancer, some sexually transmitted infections, penile inflammatory and retractile disorders, as well as easier hygiene.  Risks include bleeding , infection, injury of glans which may lead to penile deformity or urinary tract issues, unsatisfactory cosmetic appearance and other potential complications related to the procedure.  It was emphasized that this is an elective procedure.  Patient wants to proceed with circumcision; written informed consent obtained.  Plan for circumcision when infant is closer to discharge.   Lennart Pall, MD 08/31/2023 1:32 PM

## 2023-09-01 LAB — SURGICAL PATHOLOGY

## 2023-09-01 NOTE — Plan of Care (Signed)
  Problem: Education: Goal: Knowledge of General Education information will improve Description: Including pain rating scale, medication(s)/side effects and non-pharmacologic comfort measures Outcome: Progressing   Problem: Clinical Measurements: Goal: Ability to maintain clinical measurements within normal limits will improve Outcome: Progressing Goal: Will remain free from infection Outcome: Progressing Goal: Diagnostic test results will improve Outcome: Progressing Goal: Respiratory complications will improve Outcome: Progressing Goal: Cardiovascular complication will be avoided Outcome: Progressing   Problem: Activity: Goal: Risk for activity intolerance will decrease Outcome: Progressing   Problem: Nutrition: Goal: Adequate nutrition will be maintained Outcome: Progressing   Problem: Coping: Goal: Level of anxiety will decrease Outcome: Progressing   Problem: Safety: Goal: Ability to remain free from injury will improve Outcome: Progressing

## 2023-09-01 NOTE — Progress Notes (Signed)
Patient screened out for psychosocial assessment since none of the following apply:  Psychosocial stressors documented in mother or baby's chart  Gestation less than 32 weeks  Code at delivery   Infant with anomalies Please contact the Clinical Social Worker if specific needs arise, by MOB's request, or if MOB scores greater than 9/yes to question 10 on Edinburgh Postpartum Depression Screen.  Sui Kasparek, LCSW Clinical Social Worker [facility] Cell#: [phone]     

## 2023-09-01 NOTE — Progress Notes (Signed)
POSTPARTUM PROGRESS NOTE  POD #2  Subjective:  Adriana Dawson is a 37 y.o. [id number] s/p pLTCS at [redacted]w[redacted]d. Today she notes she is doing ok- still has moments of soreness, but improvement with medication. She denies any problems with ambulating, voiding or po intake. Denies nausea or vomiting. She has passed flatus, no BM.  Lochia improving Denies fever/chills/chest pain/SOB.  no HA, no blurry vision, no RUQ pain  Objective: Blood pressure 107/70, pulse 99, temperature 98.6 F (37 C), temperature source Oral, resp. rate 18, height 5\' 3"  (1.6 m), weight 84.1 kg, last menstrual period 12/26/2022, SpO2 98%, unknown if currently breastfeeding.  BP Range: 107-135/67-89  Physical Exam:  General: alert, cooperative and no distress Chest: no respiratory distress Heart: regular rate and rhythm Abdomen: soft, nontender, +BS Uterine Fundus: firm, appropriately tender Incision: minimal dry blood noted on honeycomb DVT Evaluation: No calf swelling or tenderness Extremities: no edema Skin: warm, dry  No results found for this or any previous visit (from the past 24 hour(s)).  Assessment/Plan: Adriana Dawson is a 37 y.o. [phone] s/p pLTCS at [redacted]w[redacted]d POD#2 complicated by: 1) Preeclampsia with severe features -BP stable with current meds- Procardia XL 30mg  daily and Lasix -asymptomatic, labs stable  2) Postpartum care -continue pain management as needed -encourage ambulation  Contraception: to be determined Feeding: breastfeeding in NICU  Dispo: continue postop care, plan for discharge home tomorrow   LOS: 2 days   Myna Hidalgo, DO Faculty Attending, [facility] 09/01/2023, 9:48 AM

## 2023-09-01 NOTE — Lactation Note (Signed)
This note was copied from a baby's chart.  NICU Lactation Consultation Note  Patient Name: Adriana Dawson FIEPP'I Date: 09/01/2023 Age:37 hours  Reason for consult: Infant < 6lbs; 1st time breastfeeding; Late-preterm 34-36.6wks; Follow-up assessment; NICU baby; Primapara; Other (Comment) (AMA [employer] Employee, Severe IUGR)  SUBJECTIVE LC Mili and LC student visited 55 hour female infant, Adriana Dawson. Mother has collected colostrum and has stored it in fridge. LC provided education over milk transition, specifically about color, ensuring that pink milk and rusty pipe is normal. Mother stated her flanges are working well and does not have any breast pain at the moment. STS was adjusted for infant to be directly on mothers skin. Education about STS included temperature regulation, respiratory regulation, and support for infant growth.   OBJECTIVE Infant data: Mother's Current Feeding Choice: Breast Milk and Donor Milk  O2 Device: Room Air O2 Flow Rate (L/min): 4 L/min FiO2 (%): 21 %  Infant feeding assessment Scale for Readiness: 2   Maternal data: G2P0111 C-Section, Low Transverse Pumping frequency: 7 sessions in 24 hours Pumped volume: 1 mL Flange Size: 18 Hands-free pumping top sizes: Small/Medium (Blue)  WIC Program: No WIC Referral Sent?: No Pump: Stork Pump (Spectra S2)  ASSESSMENT Infant:   Feeding Status: Scheduled 9-12-3-6 Feeding method: Tube/Gavage (Bolus)  Maternal: Milk volume: Normal  INTERVENTIONS/PLAN Interventions: Interventions: Skin to skin; Breast feeding basics reviewed; DEBP; Education Tools: Pump; Hands-free pumping top; Flanges Pump Education: Setup, frequency, and cleaning; Milk Storage  Plan: Continue pumping every 3 hours, ideally 8 pumping sessions in 24 hours. STS with infant when possible.  Father of Adriana Dawson present and supportive during visit. Questions and concerns were answered. Family aware of LC services and will contact PRN.  Consult  Status: NICU follow-up NICU Follow-up type: Maternal D/C visit  Adriana Dawson 09/01/2023, 12:41 PM

## 2023-09-01 NOTE — Lactation Note (Signed)
This note was copied from a baby's chart. Lactation Consultation Note  Patient Name: Adriana Dawson PPIRJ'J Date: 09/01/2023 Age:37 hours   Parents called out for lactation because Ms. Proefrock had a question regarding the hospital grade pump and her Spectra pump. She voiced she started "leaking" with hospital grade pump, probably due to pumping band being too lose; she's using the S/M pumping band. Discussed options about pumping bras, such as buying one or making one out of an old sport bra that still fits her. She was engaging in STS care and asked LC to come back tomorrow to re-assess pumping band size. Parents aware of LC services and will contact PRN.   Adriana Dawson 09/01/2023, 5:59 PM

## 2023-09-02 ENCOUNTER — Other Ambulatory Visit (HOSPITAL_COMMUNITY): Payer: Self-pay

## 2023-09-02 MED ORDER — FUROSEMIDE 20 MG PO TABS
20.0000 mg | ORAL_TABLET | Freq: Every day | ORAL | 0 refills | Status: AC
  Filled 2023-09-02: qty 4, 4d supply, fill #0

## 2023-09-02 MED ORDER — GABAPENTIN 300 MG PO CAPS
300.0000 mg | ORAL_CAPSULE | Freq: Three times a day (TID) | ORAL | 0 refills | Status: AC
Start: 2023-09-02 — End: 2023-09-05
  Filled 2023-09-02: qty 9, 3d supply, fill #0

## 2023-09-02 MED ORDER — OXYCODONE HCL 5 MG PO TABS
5.0000 mg | ORAL_TABLET | Freq: Four times a day (QID) | ORAL | 0 refills | Status: AC | PRN
  Filled 2023-09-02: qty 26, 7d supply, fill #0

## 2023-09-02 MED ORDER — IBUPROFEN 600 MG PO TABS
600.0000 mg | ORAL_TABLET | Freq: Four times a day (QID) | ORAL | 0 refills | Status: AC
  Filled 2023-09-02: qty 30, 8d supply, fill #0

## 2023-09-02 MED ORDER — ESCITALOPRAM OXALATE 10 MG PO TABS: 30.0000 mg | ORAL_TABLET | Freq: Every day | ORAL | 0 refills | Status: DC

## 2023-09-02 MED ORDER — DOCUSATE SODIUM 100 MG PO CAPS
100.0000 mg | ORAL_CAPSULE | Freq: Two times a day (BID) | ORAL | 0 refills | Status: AC
  Filled 2023-09-02: qty 40, 20d supply, fill #0

## 2023-09-02 MED ORDER — ACETAMINOPHEN 325 MG PO TABS
650.0000 mg | ORAL_TABLET | Freq: Four times a day (QID) | ORAL | 0 refills | Status: AC | PRN
  Filled 2023-09-02: qty 60, 8d supply, fill #0

## 2023-09-03 ENCOUNTER — Ambulatory Visit (HOSPITAL_COMMUNITY): Payer: Self-pay

## 2023-09-03 NOTE — Lactation Note (Signed)
This note was copied from a baby's chart.  NICU Lactation Consultation Note  Patient Name: Adriana Dawson [id number] Date: 09/03/2023 Age:37 days  Reason for consult: Follow-up assessment; Primapara; 1st time breastfeeding; Maternal discharge; NICU baby; Late-preterm 34-36.6wks; Other (Comment); Breastfeeding assistance; Infant < 6lbs (AMA, [employer] employee, severe IUGR)  SUBJECTIVE Visited with family of 65 66/20 weeks old AGA NICU female twice; the first time to check on pumping status and the second time for the 3 pm feeding assist. Adriana Dawson is a P1 and reports her milk came in, praised her for her efforts. Provided with another pumping band in size S/M per her request. Noticed that pumping hasn't been consistent (see maternal assessment), she was discharged yesterday. Reviewed discharge education, pump settings and the importance of consistent pumping for the prevention of engorgement and to protect her supply. This LC came back at 3 pm to assist with the first latch but baby "Adriana Dawson" did not wake up to feed, he was very sleepy and wouldn't open his mouth, an attempt was documented. Left couplet engaging in STS care.   OBJECTIVE Infant data: Mother's Current Feeding Choice: Breast Milk and Donor Milk  O2 Device: Room Air  Infant feeding assessment Scale for Readiness: 2 Scale for Quality: 2   Maternal data: G2P0111 C-Section, Low Transverse Pumping frequency: 4 times/24 hours Pumped volume: 55 mL Flange Size: 18 Hands-free pumping top sizes: Small/Medium (Blue)  WIC Program: No WIC Referral Sent?: No Pump: Stork Pump (Spectra S2)  ASSESSMENT Infant: Feeding Status: Scheduled 9-12-3-6 Feeding method: Breast Nipple Type: Dr. Levert Feinstein Preemie  Maternal: Milk volume: Normal No S/S of engorgement at this time but breasts are filling in  INTERVENTIONS/PLAN Interventions: Interventions: Breast feeding basics reviewed; Coconut oil; DEBP; Assisted with latch; Skin to skin; Breast  massage; Hand express; Adjust position; Support pillows; Education; Infant Driven Feeding Algorithm education Discharge Education: Engorgement and breast care Tools: Pump; Hands-free pumping top; Flanges; Coconut oil Pump Education: Setup, frequency, and cleaning; Milk Storage  Plan: Encouraged pumping every 3 hours on maintain mode; ideally 8 pumping sessions/24 hours Breast massage, hand expression and coconut oil were also encouraged prior pumping She'll start taking "Adriana Dawson" to a pumped breast on feeding cues around feeding times and will call for assistance PRN. If baby doesn't latch, couple will do STS  [name] present and supportive. All questions and concerns answered, family to contact [facility] services PRN.  Consult Status: NICU follow-up NICU Follow-up type: Weekly NICU follow up   Kitti Mcclish S Philis Nettle 09/03/2023, 3:32 PM

## 2023-09-05 ENCOUNTER — Ambulatory Visit (HOSPITAL_COMMUNITY): Payer: Self-pay

## 2023-09-05 NOTE — Lactation Note (Signed)
This note was copied from a baby's chart.  NICU Lactation Consultation Note  Patient Name: Adriana Dawson [id number] Date: 09/05/2023 Age:37 days  Reason for consult: Follow-up assessment; NICU baby; Primapara; 1st time breastfeeding; Breastfeeding assistance; Late-preterm 34-36.6wks; Infant < 6lbs  SUBJECTIVE  LC in to assist/assess baby at the breast at the 3 pm feeding.  Baby showing feeding cues while in the isolette.  Mom last pumped 3 hrs ago and expressed 30 ml.  Some pumping expresses 50 ml.  Baby positioned STS in football hold on right breast.  Baby unable to attain a latch after a few attempts and baby placed STS on Mom's chest to settle down.  16 mm nipple shield initiated and 2 ml of EBM instilled into nipple shield.  Baby able to latch and sustain with non-nutritive sucking and nutritive sucking for 7 mins.  Baby relaxed and contented during the feeding.  Taught Mom to use alternate breast compression gently during sucking.  Baby fatigued after 7 mins and milk noted in the shield after baby came off.  Left Mom and baby doing STS while gavage running. Mom encouraged to post breastfeed pump.  LC to return at 3pm tomorrow for another feeding assist.  OBJECTIVE Infant data: Mother's Current Feeding Choice: Breast Milk and Donor Milk  O2 Device: Room Air  Infant feeding assessment Scale for Readiness: 2 Scale for Quality: 3 (Some increased pacing to take full breath, quick fatigue)   Maternal data: [id number] C-Section, Low Transverse Pumping frequency: 6-8 times per 24 hrs Pumped volume: 30 mL Flange Size: 18  WIC Program: No WIC Referral Sent?: No Pump: Stork Pump (Spectra S2)  ASSESSMENT Infant: Latch: Grasps breast easily, tongue down, lips flanged, rhythmical sucking. Audible Swallowing: A few with stimulation Type of Nipple: Everted at rest and after stimulation (short nipple shafts, compressible areola) Comfort (Breast/Nipple): Soft / non-tender Hold  (Positioning): Assistance needed to correctly position infant at breast and maintain latch. LATCH Score: 8  Feeding Status: Scheduled 9-12-3-6 Feeding method: Breast Nipple Type: Dr. Levert Feinstein Preemie  Maternal: Milk volume: Low  INTERVENTIONS/PLAN Interventions: Interventions: Breast feeding basics reviewed; Assisted with latch; Skin to skin; Breast massage; Hand express; Breast compression; Support pillows; Adjust position; DEBP Tools: Pump; Flanges; Bottle; Nipple Shields Pump Education: Setup, frequency, and cleaning; Milk Storage Nipple shield size: 16  Plan: Consult Status: NICU follow-up NICU Follow-up type: Assist with IDF-2 (Mother does not need to pre-pump before breastfeeding)   Judee Clara 09/05/2023, 3:36 PM

## 2023-09-06 ENCOUNTER — Ambulatory Visit (HOSPITAL_COMMUNITY): Payer: Self-pay

## 2023-09-06 ENCOUNTER — Telehealth: Payer: Self-pay | Admitting: *Deleted

## 2023-09-06 NOTE — Lactation Note (Signed)
This note was copied from a baby's chart.  NICU Lactation Consultation Note  Patient Name: Boy Saylee Lord ZOXWR'U Date: 09/06/2023 Age:37 years  Reason for consult: Follow-up assessment; Primapara; 1st time breastfeeding; NICU baby; Late-preterm 34-36.6wks; Infant < 6lbs; Breastfeeding assistance; Mother's request  SUBJECTIVE  LC in to assist with 3 pm feeding.  Baby sleepy today.  Placed baby STS on Mom's chest and showing subtle feeding cues.  Mom last pumped 1 1/2 hr ago for 60 ml.    Baby assisted to latch without a nipple shield.  Hand expressed a drop, baby latched and sucked a few times with deep jaw drops, but unable to sustain latch longer than a minute.  LC initiated a 16 mm nipple shield and baby positioned in football hold.  With assistance, Mom able to help baby latch, but baby too fatigued to sustain a deep latch.  Baby fed for 5 mins with non-nutritive sucking.  Baby showing stress cues, hands at face and starting to fuss.  LC helped place baby on Mom's chest STS where he fell asleep with gavage feeding running.  Encouraged Mom to continue her consistent pumping to support  her full milk supply.  OBJECTIVE Infant data: Mother's Current Feeding Choice: Breast Milk and Donor Milk  O2 Device: Room Air  Infant feeding assessment Scale for Readiness: 2 Scale for Quality: 3   Maternal data: G2P0111 C-Section, Low Transverse Pumping frequency: 7-8 times per 24 hrs Pumped volume: 60 mL (50-90 ml) Flange Size: 18 Hands-free pumping top sizes: Small/Medium (Blue)  WIC Program: No WIC Referral Sent?: No Pump: Stork Pump (Spectra S2)  ASSESSMENT Infant: Latch: Repeated attempts needed to sustain latch, nipple held in mouth throughout feeding, stimulation needed to elicit sucking reflex. Audible Swallowing: None Type of Nipple: Everted at rest and after stimulation Comfort (Breast/Nipple): Soft / non-tender Hold (Positioning): Assistance needed to correctly position  infant at breast and maintain latch. LATCH Score: 6  Feeding Status: Scheduled 9-12-3-6 Feeding method: Breast; Tube/Gavage (Bolus) Nipple Type: Dr. Levert Feinstein Preemie  Maternal: Milk volume: Normal  INTERVENTIONS/PLAN Interventions: Interventions: Breast feeding basics reviewed; Skin to skin; Breast massage; Hand express; Adjust position; Support pillows; Breast compression; Position options; DEBP Tools: Pump; Flanges; Hands-free pumping top; Nipple Shields Pump Education: Setup, frequency, and cleaning; Milk Storage Nipple shield size: 16  Plan: 1- STS with baby 2- offer the breast with feeding cues, using nipple shield to help baby sustain latch 3- Pump both breasts 15-30 mins after breast feeding or if baby receives bottle.  Consult Status: NICU follow-up NICU Follow-up type: Weekly NICU follow up; Assist with IDF-2 (Mother does not need to pre-pump before breastfeeding)   Judee Clara 09/06/2023, 3:25 PM

## 2023-09-06 NOTE — Telephone Encounter (Signed)
Returned call x 2 from 2:41 PM. Voicemail full on 1st call. Left patient a message on 2nd call to call and schedule.

## 2023-09-07 ENCOUNTER — Ambulatory Visit: Payer: Commercial Managed Care - PPO | Admitting: Obstetrics and Gynecology

## 2023-09-07 ENCOUNTER — Encounter: Payer: Self-pay | Admitting: Obstetrics and Gynecology

## 2023-09-07 MED ORDER — TRIAMCINOLONE ACETONIDE 0.1 % EX CREA: TOPICAL_CREAM | CUTANEOUS | 0 refills | Status: AC

## 2023-09-07 MED ORDER — TRIAMCINOLONE ACETONIDE 0.1 % EX CREA: 1.0000 | TOPICAL_CREAM | Freq: Two times a day (BID) | CUTANEOUS | 0 refills | Status: DC

## 2023-09-07 NOTE — Addendum Note (Signed)
Addended by: Harvie Bridge on: 09/07/2023 09:52 AM   Modules accepted: Orders

## 2023-09-07 NOTE — Progress Notes (Signed)
   POSTPARTUM BP VISIT  Subjective:  Adriana Dawson is a 37 y.o. G2P0111 8d s/p emergent pLTCS for NRFHT and preE w/ SF presenting for incision and BP check  Itchy rash on the back of her neck. Otherwise doing well.  Finished lasix Notes mild/intermittent itching around incision Taking tylenol & ibuprofen prn for pain Breastfeeding  Objective:   Vitals:   09/07/23 0831  BP: 111/78  Pulse: (!) 108  Weight: 177 lb (80.3 kg)  Height: 5\' 3"  (1.6 m)    General:  Alert, oriented and cooperative. Patient is in no acute distress.  Skin: Skin is warm and dry. No rash noted.   Cardiovascular: Normal heart rate noted  Respiratory: Normal respiratory effort, no problems with respiration noted  Abdomen: Soft, non-tender, non-distended. Incision healing well, steri trips on R lateral aspect of incision. NO e/o cellulitis or seroma   Assessment and Plan:  Adriana Dawson is a 37 y.o. with preE w/ SF and s/p pLTCS  PreE w/ SF Normotensive today, no meds  2. Post op Recovering appropriately Remove steris if not off by the weekend  3. Contact dermatitis - rx for kenalog cream sent  Future Appointments  Date Time Provider Department Center  10/04/2023  2:50 PM Rasch, Harolyn Rutherford, NP [facility]    Lennart Pall, MD

## 2023-09-08 ENCOUNTER — Other Ambulatory Visit: Payer: Commercial Managed Care - PPO

## 2023-09-08 ENCOUNTER — Ambulatory Visit: Payer: Commercial Managed Care - PPO

## 2023-09-09 ENCOUNTER — Ambulatory Visit (HOSPITAL_COMMUNITY): Payer: Self-pay

## 2023-09-09 NOTE — Lactation Note (Signed)
This note was copied from a baby's chart. Lactation Consultation Note  Patient Name: Boy Carolynn Boothby [id number] Date: 09/09/2023 Age:37 days   Ms. Seaman called out for lactation because she voiced she was having trouble with her [employer] S2 pump, she believes it's "Broken". This LC spoke with [employer] representative and they came to the hospital to swap the pumps but Ms. Zaya did bring her pump, she left it at home. She'll be coming at 10 am tomorrow to bring her home pump and troubleshoot. [employer] representative aware that family will be here tomorrow to do the exchange. NICU LC services to F/U tomorrow for pump trouble shooting and possibly new issuance.    Kendi Defalco S Sabir Charters 09/09/2023, 5:27 PM

## 2023-09-10 ENCOUNTER — Ambulatory Visit (HOSPITAL_COMMUNITY): Payer: Self-pay

## 2023-09-10 NOTE — Lactation Note (Signed)
This note was copied from a baby's chart. Lactation Consultation Note  Patient Name: Adriana Dawson ZOXWR'U Date: 09/10/2023 Age:37 days   Parents called out for lactation because they brought their Spectra Stork pump for troubleshooting. Pump turns on and suctions well, but one of the while membranes is torn. Let them know that they can get a replacement of those membranes off the rack, but they they voiced the warranty should cover that since they only had the pump for a week. Spoke to [employer] representative "Selena Batten" and she asked parents to call out tomorrow so they can send their representative on site to do the pump swap. Left message on a sticky note on computer in baby's room; NICU RN Baird Lyons R will pass the message to night shift to be relayed to day shift tomorrow. Parents to notify nursing staff once they come to the hospital on 09/11/2023 for pump swap.   Jameia Makris S [name] 09/10/2023, 5:52 PM

## 2023-09-13 ENCOUNTER — Encounter: Payer: Commercial Managed Care - PPO | Admitting: Obstetrics and Gynecology

## 2023-09-14 ENCOUNTER — Telehealth: Payer: Self-pay

## 2023-09-14 NOTE — Telephone Encounter (Signed)
Pt called stating she had a small amount of bleeding near stitches at c section incision site. Pt was instructed to try to keep the area clean. Pt was told to seek medical care if she begins to develop a fever or has any discolored discharge from incision site. Pt expressed understanding.

## 2023-09-16 ENCOUNTER — Ambulatory Visit: Payer: Commercial Managed Care - PPO

## 2023-09-16 ENCOUNTER — Other Ambulatory Visit: Payer: Commercial Managed Care - PPO

## 2023-09-18 ENCOUNTER — Ambulatory Visit (HOSPITAL_COMMUNITY): Payer: Self-pay

## 2023-09-18 NOTE — Lactation Note (Signed)
This note was copied from a baby's chart.  NICU Lactation Consultation Note  Patient Name: Adriana Dawson Date: 09/18/2023 Age:37 wk.o.  Reason for consult: Weekly NICU follow-up; Primapara; 1st time breastfeeding; Other (Comment); NICU baby; Late-preterm 34-36.6wks; Infant < 6lbs (AMA, [employer] employee, severe IUGR)  SUBJECTIVE Visited with family of 55 17/46 weeks old AGA NICU female; Adriana Dawson is a P1 and reports she's pumping consistently, her supply continues to increase, praised her for her efforts. Provided another pumping band in size S/M per her request. Parents reported they received a new pump from [employer] but it was a Medela; they didn't take the old pump, this LC removed old/defective Spectra pump from the room. Parents are taking baby "Adriana Dawson" home today. Reviewed discharge education and the importance of consistent pumping after feedings/attempts at the breast to protect her supply. Revised additional strategies to increase supply per Adriana Dawson's request. Referral to [facility] OP sent to [facility] as Adriana Dawson is interested in putting baby to breast. Adriana Dawson present and supportive. All questions and concerns answered, family to contact [facility] services PRN.  OBJECTIVE Infant data: Mother's Current Feeding Choice: Breast Milk  O2 Device: Room Air  Infant feeding assessment IDFTS - Readiness: 1 IDFTS - Quality: 2   Maternal data: [id number] C-Section, Low Transverse Current breast feeding challenges:: NICU admission Pumping frequency: 6 times/24 hours Pumped volume: 120 mL (120-145 ml) Flange Size: 18 Hands-free pumping top sizes: Small/Medium (Blue)  WIC Program: No WIC Referral Sent?: No Pump: Stork Pump ([employer] switched Spectra S2 for Medela MaxFlow)  ASSESSMENT Infant: Feeding Status: Ad lib Feeding method: Bottle Nipple Type: Dr. Levert Feinstein Preemie  Maternal: Milk volume: Normal  INTERVENTIONS/PLAN Interventions: Interventions: Breast feeding basics  reviewed; Coconut oil; DEBP; Education Discharge Education: Outpatient recommendation; Outpatient Epic message sent Tools: Pump; Flanges; Coconut oil; Hands-free pumping top Pump Education: Setup, frequency, and cleaning; Milk Storage  Plan: Consult Status: Complete   Jcion Buddenhagen S Yuval Nolet 09/18/2023, 1:36 PM

## 2023-09-19 ENCOUNTER — Encounter (HOSPITAL_COMMUNITY): Payer: Self-pay

## 2023-09-22 ENCOUNTER — Encounter: Payer: Commercial Managed Care - PPO | Admitting: Obstetrics and Gynecology

## 2023-10-03 NOTE — Progress Notes (Signed)
 Post Partum Visit Note  Adriana Dawson is a 38 y.o. [phone] female who presents for a postpartum visit. She is 4 weeks postpartum following a primary cesarean section.  I have fully reviewed the prenatal and intrapartum course. The delivery was at 34 gestational weeks.  Anesthesia: general. Postpartum course has been unremarkable. Baby is doing well. Baby is feeding by breast. Bleeding staining only. Bowel function is normal. Bladder function is normal. Patient is not sexually active. Contraception method is condoms. Postpartum depression screening: positive- score 15.   The pregnancy intention screening data noted above was reviewed. Potential methods of contraception were discussed. The patient elected to proceed with No data recorded.   Edinburgh Postnatal Depression Scale - 10/04/23 1502       Edinburgh Postnatal Depression Scale:  In the Past 7 Days   I have been able to laugh and see the funny side of things. 1    I have looked forward with enjoyment to things. 1    I have blamed myself unnecessarily when things went wrong. 2    I have been anxious or worried for no good reason. 2    I have felt scared or panicky for no good reason. 2    Things have been getting on top of me. 2    I have been so unhappy that I have had difficulty sleeping. 2    I have felt sad or miserable. 2    I have been so unhappy that I have been crying. 1    The thought of harming myself has occurred to me. 0    Edinburgh Postnatal Depression Scale Total 15             Health Maintenance Due  Topic Date Due   Cervical Cancer Screening (HPV/Pap Cotest)  05/16/2020   COVID-19 Vaccine (4 - 2024-25 season) 05/29/2023    The following portions of the patient's history were reviewed and updated as appropriate: allergies, current medications, past family history, past medical history, past social history, past surgical history, and problem list.  Review of Systems Pertinent items are noted in  HPI.  Objective:  BP 113/70   Pulse 67   Ht 5' 3 (1.6 m)   Wt 173 lb (78.5 kg)   LMP 12/26/2022 (Exact Date)   BMI 30.65 kg/m    General:  alert, cooperative, and no distress   Breasts:  not indicated  Lungs: clear to auscultation bilaterally  Heart:  regular rate and rhythm, S1, S2 normal, no murmur, click, rub or gallop       Assessment:   Normal postpartum exam.  Worsening postpartum anxiety 2/2 birth trauma Declines BC    Plan:   Essential components of care per ACOG recommendations:  1.  Mood and well being: Patient with positive depression screening today. Reviewed local resources for support.  - Patient tobacco use? No.   - hx of drug use? No.    2. Infant care and feeding:  -Patient currently breastmilk feeding? Yes. Reviewed importance of draining breast regularly to support lactation.  -Social determinants of health (SDOH) reviewed in EPIC. No concerns  3. Sexuality, contraception and birth spacing - Patient does not want a pregnancy in the next year.  Desired family size is Uncertain at this time.  - Reviewed reproductive life planning. Reviewed contraceptive methods based on pt preferences and effectiveness.  Patient desired Female Condom today.   - Discussed birth spacing of 18 months  4. Sleep and fatigue -  Encouraged family/partner/community support of 4 hrs of uninterrupted sleep to help with mood and fatigue  5. Physical Recovery  - Discussed patients delivery and complications. She describes her labor as mixed. - Patient had a C-section emergent. Baby was in NICU and now home.  - Patient is safe to resume physical and sexual activity  6.  Health Maintenance - HM due items addressed No -   - Last pap smear  Diagnosis  Date Value Ref Range Status  05/16/2017   Final   NEGATIVE FOR INTRAEPITHELIAL LESIONS OR MALIGNANCY. BENIGN REACTIVE/REPARATIVE CHANGES.   Pap smear not done at today's visit.  -Breast Cancer screening indicated? No.   7. Chronic  Disease/Pregnancy Condition follow up: Hypertension  - PCP follow up   Increase Lexapro  to 40 mg daily. Data now super supportive of 40 mg vs 35 mg. Will trial and she will follow up as needed Recommended therapy with a perinatal trauma specialist. Recommend call [facility].  Ask partner for help especially on nights with little sleep.   Delon Emms, NP [facility] [employer], [facility]

## 2023-10-04 ENCOUNTER — Ambulatory Visit: Payer: Commercial Managed Care - PPO | Admitting: Obstetrics and Gynecology

## 2023-10-04 DIAGNOSIS — F419 Anxiety disorder, unspecified: Secondary | ICD-10-CM | POA: Diagnosis not present

## 2023-10-04 DIAGNOSIS — F32A Depression, unspecified: Secondary | ICD-10-CM | POA: Diagnosis not present

## 2023-10-04 MED ORDER — ESCITALOPRAM OXALATE 20 MG PO TABS: 40.0000 mg | ORAL_TABLET | Freq: Every day | ORAL | 2 refills | Status: AC

## 2023-10-04 NOTE — Patient Instructions (Signed)
 Call [facility] in [place] and ask to see someone who specializes in Perinatal trauma.

## 2023-10-07 ENCOUNTER — Telehealth: Payer: Self-pay | Admitting: *Deleted

## 2023-10-07 NOTE — Telephone Encounter (Signed)
 Returned call from 10/06/2023 at 10:26 AM. I was not in the office on 10/06/2023. Left patient a message to call back.

## 2023-10-17 ENCOUNTER — Other Ambulatory Visit (HOSPITAL_COMMUNITY): Payer: Self-pay

## 2023-10-17 ENCOUNTER — Other Ambulatory Visit: Payer: Self-pay

## 2023-10-17 MED ORDER — ESCITALOPRAM OXALATE 20 MG PO TABS
40.0000 mg | ORAL_TABLET | Freq: Every day | ORAL | 2 refills | Status: DC
  Filled 2023-10-17 (×2): qty 60, 30d supply, fill #0

## 2023-12-13 DIAGNOSIS — F53 Postpartum depression: Secondary | ICD-10-CM | POA: Diagnosis not present

## 2023-12-13 DIAGNOSIS — F411 Generalized anxiety disorder: Secondary | ICD-10-CM | POA: Diagnosis not present

## 2023-12-14 DIAGNOSIS — F53 Postpartum depression: Secondary | ICD-10-CM | POA: Diagnosis not present

## 2023-12-14 DIAGNOSIS — F411 Generalized anxiety disorder: Secondary | ICD-10-CM | POA: Diagnosis not present

## 2023-12-19 ENCOUNTER — Other Ambulatory Visit (HOSPITAL_COMMUNITY): Payer: Self-pay

## 2023-12-19 ENCOUNTER — Other Ambulatory Visit (HOSPITAL_BASED_OUTPATIENT_CLINIC_OR_DEPARTMENT_OTHER): Payer: Self-pay

## 2023-12-19 ENCOUNTER — Other Ambulatory Visit: Payer: Self-pay

## 2023-12-19 MED ORDER — ESCITALOPRAM OXALATE 20 MG PO TABS
30.0000 mg | ORAL_TABLET | Freq: Every day | ORAL | 1 refills | Status: AC
  Filled 2023-12-19 (×2): qty 135, 90d supply, fill #0

## 2023-12-19 MED ORDER — BUSPIRONE HCL 30 MG PO TABS
30.0000 mg | ORAL_TABLET | Freq: Two times a day (BID) | ORAL | 2 refills | Status: AC
  Filled 2023-12-19 (×3): qty 180, 90d supply, fill #0

## 2023-12-23 DIAGNOSIS — F411 Generalized anxiety disorder: Secondary | ICD-10-CM | POA: Diagnosis not present

## 2023-12-23 DIAGNOSIS — F53 Postpartum depression: Secondary | ICD-10-CM | POA: Diagnosis not present

## 2024-01-04 DIAGNOSIS — F53 Postpartum depression: Secondary | ICD-10-CM | POA: Diagnosis not present

## 2024-01-04 DIAGNOSIS — F411 Generalized anxiety disorder: Secondary | ICD-10-CM | POA: Diagnosis not present

## 2024-01-25 DIAGNOSIS — F411 Generalized anxiety disorder: Secondary | ICD-10-CM | POA: Diagnosis not present

## 2024-01-25 DIAGNOSIS — F53 Postpartum depression: Secondary | ICD-10-CM | POA: Diagnosis not present

## 2024-01-30 ENCOUNTER — Other Ambulatory Visit (HOSPITAL_COMMUNITY): Payer: Self-pay

## 2024-01-30 MED ORDER — ZURZUVAE 25 MG PO CAPS
25.0000 mg | ORAL_CAPSULE | Freq: Every day | ORAL | 0 refills | Status: AC
  Filled 2024-01-30 – 2024-02-13 (×2): qty 28, 14d supply, fill #0

## 2024-02-13 ENCOUNTER — Other Ambulatory Visit (HOSPITAL_COMMUNITY): Payer: Self-pay

## 2024-02-13 ENCOUNTER — Other Ambulatory Visit (HOSPITAL_BASED_OUTPATIENT_CLINIC_OR_DEPARTMENT_OTHER): Payer: Self-pay

## 2024-02-17 ENCOUNTER — Other Ambulatory Visit (HOSPITAL_COMMUNITY): Payer: Self-pay

## 2024-02-17 MED ORDER — ZURZUVAE 25 MG PO CAPS
50.0000 mg | ORAL_CAPSULE | Freq: Every day | ORAL | 0 refills | Status: AC
  Filled 2024-02-17 – 2024-04-05 (×4): qty 28, 14d supply, fill #0

## 2024-02-18 DIAGNOSIS — F53 Postpartum depression: Secondary | ICD-10-CM | POA: Diagnosis not present

## 2024-02-18 DIAGNOSIS — F411 Generalized anxiety disorder: Secondary | ICD-10-CM | POA: Diagnosis not present

## 2024-02-29 ENCOUNTER — Other Ambulatory Visit (HOSPITAL_COMMUNITY): Payer: Self-pay

## 2024-03-06 DIAGNOSIS — F411 Generalized anxiety disorder: Secondary | ICD-10-CM | POA: Diagnosis not present

## 2024-03-06 DIAGNOSIS — F53 Postpartum depression: Secondary | ICD-10-CM | POA: Diagnosis not present

## 2024-03-13 ENCOUNTER — Other Ambulatory Visit (HOSPITAL_COMMUNITY): Payer: Self-pay

## 2024-03-27 DIAGNOSIS — F411 Generalized anxiety disorder: Secondary | ICD-10-CM | POA: Diagnosis not present

## 2024-03-27 DIAGNOSIS — F53 Postpartum depression: Secondary | ICD-10-CM | POA: Diagnosis not present

## 2024-04-05 ENCOUNTER — Other Ambulatory Visit (HOSPITAL_COMMUNITY): Payer: Self-pay

## 2024-04-05 DIAGNOSIS — F411 Generalized anxiety disorder: Secondary | ICD-10-CM | POA: Diagnosis not present

## 2024-04-05 DIAGNOSIS — F53 Postpartum depression: Secondary | ICD-10-CM | POA: Diagnosis not present

## 2024-04-09 ENCOUNTER — Other Ambulatory Visit: Payer: Self-pay

## 2024-04-09 ENCOUNTER — Other Ambulatory Visit (HOSPITAL_BASED_OUTPATIENT_CLINIC_OR_DEPARTMENT_OTHER): Payer: Self-pay

## 2024-04-09 ENCOUNTER — Other Ambulatory Visit (HOSPITAL_COMMUNITY): Payer: Self-pay

## 2024-04-09 DIAGNOSIS — F411 Generalized anxiety disorder: Secondary | ICD-10-CM | POA: Diagnosis not present

## 2024-04-09 MED ORDER — ESCITALOPRAM OXALATE 20 MG PO TABS
30.0000 mg | ORAL_TABLET | Freq: Every day | ORAL | 1 refills | Status: AC
  Filled 2024-04-09: qty 135, 90d supply, fill #0

## 2024-04-09 MED ORDER — METHYLPHENIDATE HCL ER (OSM) 36 MG PO TBCR
36.0000 mg | EXTENDED_RELEASE_TABLET | Freq: Every day | ORAL | 0 refills | Status: AC
  Filled 2024-04-09: qty 30, 30d supply, fill #0

## 2024-04-16 ENCOUNTER — Other Ambulatory Visit: Payer: Self-pay

## 2024-04-18 ENCOUNTER — Other Ambulatory Visit (HOSPITAL_COMMUNITY): Payer: Self-pay

## 2024-04-27 DIAGNOSIS — F419 Anxiety disorder, unspecified: Secondary | ICD-10-CM | POA: Diagnosis not present

## 2024-05-03 DIAGNOSIS — F411 Generalized anxiety disorder: Secondary | ICD-10-CM | POA: Diagnosis not present

## 2024-05-03 DIAGNOSIS — F53 Postpartum depression: Secondary | ICD-10-CM | POA: Diagnosis not present

## 2024-05-04 DIAGNOSIS — F419 Anxiety disorder, unspecified: Secondary | ICD-10-CM | POA: Diagnosis not present

## 2024-05-14 DIAGNOSIS — F419 Anxiety disorder, unspecified: Secondary | ICD-10-CM | POA: Diagnosis not present

## 2024-05-22 DIAGNOSIS — F53 Postpartum depression: Secondary | ICD-10-CM | POA: Diagnosis not present

## 2024-05-22 DIAGNOSIS — F419 Anxiety disorder, unspecified: Secondary | ICD-10-CM | POA: Diagnosis not present

## 2024-05-22 DIAGNOSIS — F411 Generalized anxiety disorder: Secondary | ICD-10-CM | POA: Diagnosis not present

## 2024-05-29 ENCOUNTER — Other Ambulatory Visit (HOSPITAL_COMMUNITY): Payer: Self-pay

## 2024-05-29 DIAGNOSIS — F53 Postpartum depression: Secondary | ICD-10-CM | POA: Diagnosis not present

## 2024-05-29 DIAGNOSIS — F411 Generalized anxiety disorder: Secondary | ICD-10-CM | POA: Diagnosis not present

## 2024-05-29 MED ORDER — BUSPIRONE HCL 30 MG PO TABS
30.0000 mg | ORAL_TABLET | Freq: Two times a day (BID) | ORAL | 2 refills | Status: AC
  Filled 2024-05-29: qty 180, 90d supply, fill #0

## 2024-05-29 MED ORDER — ESCITALOPRAM OXALATE 20 MG PO TABS
30.0000 mg | ORAL_TABLET | Freq: Every day | ORAL | 2 refills | Status: AC
  Filled 2024-05-29: qty 135, 90d supply, fill #0

## 2024-06-01 DIAGNOSIS — F419 Anxiety disorder, unspecified: Secondary | ICD-10-CM | POA: Diagnosis not present

## 2024-06-08 ENCOUNTER — Other Ambulatory Visit (HOSPITAL_COMMUNITY): Payer: Self-pay

## 2024-06-11 DIAGNOSIS — F53 Postpartum depression: Secondary | ICD-10-CM | POA: Diagnosis not present

## 2024-06-11 DIAGNOSIS — F411 Generalized anxiety disorder: Secondary | ICD-10-CM | POA: Diagnosis not present

## 2024-06-20 DIAGNOSIS — F53 Postpartum depression: Secondary | ICD-10-CM | POA: Diagnosis not present

## 2024-06-20 DIAGNOSIS — F411 Generalized anxiety disorder: Secondary | ICD-10-CM | POA: Diagnosis not present

## 2024-07-03 DIAGNOSIS — F411 Generalized anxiety disorder: Secondary | ICD-10-CM | POA: Diagnosis not present

## 2024-07-03 DIAGNOSIS — F53 Postpartum depression: Secondary | ICD-10-CM | POA: Diagnosis not present

## 2024-07-14 DIAGNOSIS — F53 Postpartum depression: Secondary | ICD-10-CM | POA: Diagnosis not present

## 2024-07-14 DIAGNOSIS — F411 Generalized anxiety disorder: Secondary | ICD-10-CM | POA: Diagnosis not present

## 2024-07-28 DIAGNOSIS — F53 Postpartum depression: Secondary | ICD-10-CM | POA: Diagnosis not present

## 2024-07-28 DIAGNOSIS — F411 Generalized anxiety disorder: Secondary | ICD-10-CM | POA: Diagnosis not present

## 2024-08-07 DIAGNOSIS — F411 Generalized anxiety disorder: Secondary | ICD-10-CM | POA: Diagnosis not present

## 2024-08-07 DIAGNOSIS — F53 Postpartum depression: Secondary | ICD-10-CM | POA: Diagnosis not present

## 2024-08-18 DIAGNOSIS — F53 Postpartum depression: Secondary | ICD-10-CM | POA: Diagnosis not present

## 2024-08-18 DIAGNOSIS — F411 Generalized anxiety disorder: Secondary | ICD-10-CM | POA: Diagnosis not present

## 2024-09-05 DIAGNOSIS — F411 Generalized anxiety disorder: Secondary | ICD-10-CM | POA: Diagnosis not present

## 2024-09-05 DIAGNOSIS — F53 Postpartum depression: Secondary | ICD-10-CM | POA: Diagnosis not present

## 2024-09-17 ENCOUNTER — Other Ambulatory Visit (HOSPITAL_COMMUNITY): Payer: Self-pay

## 2024-09-17 MED ORDER — VITAMIN D (ERGOCALCIFEROL) 1.25 MG (50000 UNIT) PO CAPS
50000.0000 [IU] | ORAL_CAPSULE | ORAL | 0 refills | Status: AC
  Filled 2024-09-17: qty 12, 84d supply, fill #0

## 2024-09-28 ENCOUNTER — Other Ambulatory Visit (HOSPITAL_COMMUNITY): Payer: Self-pay
